# Patient Record
Sex: Male | Born: 1945 | Race: White | Hispanic: No | Marital: Single | Smoking: Former smoker
Health system: Southern US, Community
[De-identification: ages and names within clinical notes are randomized; demographics above are authoritative.]

## PROBLEM LIST (undated history)

## (undated) DIAGNOSIS — N189 Chronic kidney disease, unspecified: Secondary | ICD-10-CM

## (undated) DIAGNOSIS — Z87442 Personal history of urinary calculi: Secondary | ICD-10-CM

## (undated) DIAGNOSIS — I10 Essential (primary) hypertension: Secondary | ICD-10-CM

## (undated) DIAGNOSIS — I82409 Acute embolism and thrombosis of unspecified deep veins of unspecified lower extremity: Secondary | ICD-10-CM

## (undated) DIAGNOSIS — J45909 Unspecified asthma, uncomplicated: Secondary | ICD-10-CM

## (undated) DIAGNOSIS — R319 Hematuria, unspecified: Secondary | ICD-10-CM

## (undated) DIAGNOSIS — C801 Malignant (primary) neoplasm, unspecified: Secondary | ICD-10-CM

## (undated) DIAGNOSIS — F419 Anxiety disorder, unspecified: Secondary | ICD-10-CM

## (undated) DIAGNOSIS — N200 Calculus of kidney: Secondary | ICD-10-CM

## (undated) DIAGNOSIS — I2699 Other pulmonary embolism without acute cor pulmonale: Secondary | ICD-10-CM

## (undated) DIAGNOSIS — C672 Malignant neoplasm of lateral wall of bladder: Secondary | ICD-10-CM

## (undated) DIAGNOSIS — E785 Hyperlipidemia, unspecified: Secondary | ICD-10-CM

## (undated) DIAGNOSIS — N400 Enlarged prostate without lower urinary tract symptoms: Secondary | ICD-10-CM

## (undated) DIAGNOSIS — K219 Gastro-esophageal reflux disease without esophagitis: Secondary | ICD-10-CM

## (undated) DIAGNOSIS — E119 Type 2 diabetes mellitus without complications: Secondary | ICD-10-CM

## (undated) HISTORY — DX: Malignant neoplasm of lateral wall of bladder: C67.2

## (undated) HISTORY — DX: Hyperlipidemia, unspecified: E78.5

## (undated) HISTORY — PX: PERCUTANEOUS NEPHROLITHOTRIPSY: SHX2206

## (undated) HISTORY — PX: CYSTOSCOPY: SUR368

## (undated) HISTORY — PX: OTHER SURGICAL HISTORY: SHX169

## (undated) HISTORY — PX: TRANSURETHRAL RESECTION OF BLADDER TUMOR WITH GYRUS (TURBT-GYRUS): SHX6458

## (undated) HISTORY — DX: Hematuria, unspecified: R31.9

## (undated) HISTORY — PX: APPENDECTOMY: SHX54

## (undated) HISTORY — DX: Calculus of kidney: N20.0

---

## 2014-03-05 LAB — COMPREHENSIVE METABOLIC PANEL
Albumin: 4.2 g/dL (ref 3.4–5.0)
Alkaline Phosphatase: 60 U/L
Anion Gap: 8 (ref 7–16)
BUN: 24 mg/dL — ABNORMAL HIGH (ref 7–18)
Bilirubin,Total: 0.7 mg/dL (ref 0.2–1.0)
Calcium, Total: 9.4 mg/dL (ref 8.5–10.1)
Chloride: 109 mmol/L — ABNORMAL HIGH (ref 98–107)
Co2: 25 mmol/L (ref 21–32)
Creatinine: 1.81 mg/dL — ABNORMAL HIGH (ref 0.60–1.30)
EGFR (African American): 44 — ABNORMAL LOW
EGFR (Non-African Amer.): 38 — ABNORMAL LOW
Glucose: 194 mg/dL — ABNORMAL HIGH (ref 65–99)
Osmolality: 292 (ref 275–301)
Potassium: 4.5 mmol/L (ref 3.5–5.1)
SGOT(AST): 33 U/L (ref 15–37)
SGPT (ALT): 28 U/L
Sodium: 142 mmol/L (ref 136–145)
Total Protein: 7.9 g/dL (ref 6.4–8.2)

## 2014-03-05 LAB — CBC WITH DIFFERENTIAL/PLATELET
Basophil #: 0.1 10*3/uL (ref 0.0–0.1)
Basophil %: 0.4 %
Eosinophil #: 0.1 10*3/uL (ref 0.0–0.7)
Eosinophil %: 0.5 %
HCT: 41.9 % (ref 40.0–52.0)
HGB: 14.3 g/dL (ref 13.0–18.0)
Lymphocyte #: 1.3 10*3/uL (ref 1.0–3.6)
Lymphocyte %: 7.6 %
MCH: 31.6 pg (ref 26.0–34.0)
MCHC: 34 g/dL (ref 32.0–36.0)
MCV: 93 fL (ref 80–100)
Monocyte #: 0.9 x10 3/mm (ref 0.2–1.0)
Monocyte %: 5.1 %
Neutrophil #: 15.2 10*3/uL — ABNORMAL HIGH (ref 1.4–6.5)
Neutrophil %: 86.4 %
Platelet: 204 10*3/uL (ref 150–440)
RBC: 4.52 10*6/uL (ref 4.40–5.90)
RDW: 13.1 % (ref 11.5–14.5)
WBC: 17.6 10*3/uL — ABNORMAL HIGH (ref 3.8–10.6)

## 2014-03-05 LAB — URINALYSIS, COMPLETE: Specific Gravity: 1.027 (ref 1.003–1.030)

## 2014-03-05 LAB — PROTIME-INR
INR: 1.4
Prothrombin Time: 16.7 secs — ABNORMAL HIGH (ref 11.5–14.7)

## 2014-03-06 ENCOUNTER — Inpatient Hospital Stay: Payer: Self-pay | Admitting: Internal Medicine

## 2014-03-06 LAB — CBC WITH DIFFERENTIAL/PLATELET
Basophil #: 0.1 10*3/uL (ref 0.0–0.1)
Basophil #: 0.1 10*3/uL (ref 0.0–0.1)
Basophil %: 0.6 %
Basophil %: 0.3 %
Eosinophil #: 0 10*3/uL (ref 0.0–0.7)
Eosinophil #: 0 10*3/uL (ref 0.0–0.7)
Eosinophil %: 0 %
Eosinophil %: 0.3 %
HCT: 40.4 % (ref 40.0–52.0)
HCT: 37.2 % — ABNORMAL LOW (ref 40.0–52.0)
HGB: 13.7 g/dL (ref 13.0–18.0)
HGB: 12.2 g/dL — ABNORMAL LOW (ref 13.0–18.0)
Lymphocyte #: 1.5 10*3/uL (ref 1.0–3.6)
Lymphocyte #: 2.1 10*3/uL (ref 1.0–3.6)
Lymphocyte %: 8 %
Lymphocyte %: 12.6 %
MCH: 31.8 pg (ref 26.0–34.0)
MCH: 31.1 pg (ref 26.0–34.0)
MCHC: 34 g/dL (ref 32.0–36.0)
MCHC: 32.9 g/dL (ref 32.0–36.0)
MCV: 93 fL (ref 80–100)
MCV: 95 fL (ref 80–100)
Monocyte #: 0.9 x10 3/mm (ref 0.2–1.0)
Monocyte #: 1.2 x10 3/mm — ABNORMAL HIGH (ref 0.2–1.0)
Monocyte %: 4.6 %
Monocyte %: 7.1 %
Neutrophil #: 16.7 10*3/uL — ABNORMAL HIGH (ref 1.4–6.5)
Neutrophil #: 13.4 10*3/uL — ABNORMAL HIGH (ref 1.4–6.5)
Neutrophil %: 86.8 %
Neutrophil %: 79.7 %
Platelet: 178 10*3/uL (ref 150–440)
Platelet: 149 10*3/uL — ABNORMAL LOW (ref 150–440)
RBC: 4.33 10*6/uL — ABNORMAL LOW (ref 4.40–5.90)
RBC: 3.93 10*6/uL — ABNORMAL LOW (ref 4.40–5.90)
RDW: 13.4 % (ref 11.5–14.5)
RDW: 13.4 % (ref 11.5–14.5)
WBC: 19.3 10*3/uL — ABNORMAL HIGH (ref 3.8–10.6)
WBC: 16.8 10*3/uL — ABNORMAL HIGH (ref 3.8–10.6)

## 2014-03-06 LAB — HEMOGLOBIN: HGB: 12.6 g/dL — ABNORMAL LOW (ref 13.0–18.0)

## 2014-03-07 LAB — URINALYSIS, COMPLETE
Bacteria: NONE SEEN
Hyaline Cast: 7
RBC,UR: 3283 /HPF (ref 0–5)
Specific Gravity: 1.015 (ref 1.003–1.030)
Squamous Epithelial: 1
WBC UR: 86 /HPF (ref 0–5)

## 2014-03-07 LAB — BASIC METABOLIC PANEL
Anion Gap: 6 — ABNORMAL LOW (ref 7–16)
BUN: 19 mg/dL — ABNORMAL HIGH (ref 7–18)
Calcium, Total: 8 mg/dL — ABNORMAL LOW (ref 8.5–10.1)
Chloride: 109 mmol/L — ABNORMAL HIGH (ref 98–107)
Co2: 25 mmol/L (ref 21–32)
Creatinine: 1.45 mg/dL — ABNORMAL HIGH (ref 0.60–1.30)
EGFR (African American): 57 — ABNORMAL LOW
EGFR (Non-African Amer.): 49 — ABNORMAL LOW
Glucose: 193 mg/dL — ABNORMAL HIGH (ref 65–99)
Osmolality: 287 (ref 275–301)
Potassium: 3.7 mmol/L (ref 3.5–5.1)
Sodium: 140 mmol/L (ref 136–145)

## 2014-03-08 LAB — URINE CULTURE

## 2014-03-08 LAB — BASIC METABOLIC PANEL
Anion Gap: 9 (ref 7–16)
BUN: 21 mg/dL — ABNORMAL HIGH (ref 7–18)
Calcium, Total: 8.2 mg/dL — ABNORMAL LOW (ref 8.5–10.1)
Chloride: 110 mmol/L — ABNORMAL HIGH (ref 98–107)
Co2: 24 mmol/L (ref 21–32)
Creatinine: 1.28 mg/dL (ref 0.60–1.30)
EGFR (African American): >60
EGFR (Non-African Amer.): 57 — ABNORMAL LOW
Glucose: 116 mg/dL — ABNORMAL HIGH (ref 65–99)
Osmolality: 289 (ref 275–301)
Potassium: 3.7 mmol/L (ref 3.5–5.1)
Sodium: 143 mmol/L (ref 136–145)

## 2014-03-12 LAB — PATHOLOGY REPORT

## 2014-04-02 ENCOUNTER — Ambulatory Visit: Payer: Self-pay | Admitting: Urology

## 2014-04-02 LAB — BASIC METABOLIC PANEL
Anion Gap: 4 — ABNORMAL LOW (ref 7–16)
BUN: 19 mg/dL — ABNORMAL HIGH (ref 7–18)
Calcium, Total: 9.1 mg/dL (ref 8.5–10.1)
Chloride: 107 mmol/L (ref 98–107)
Co2: 31 mmol/L (ref 21–32)
Creatinine: 1.32 mg/dL — ABNORMAL HIGH (ref 0.60–1.30)
EGFR (African American): >60
EGFR (Non-African Amer.): 57 — ABNORMAL LOW
Glucose: 145 mg/dL — ABNORMAL HIGH (ref 65–99)
Osmolality: 288 (ref 275–301)
Potassium: 3.9 mmol/L (ref 3.5–5.1)
Sodium: 142 mmol/L (ref 136–145)

## 2014-04-03 LAB — URINE CULTURE

## 2014-04-10 ENCOUNTER — Ambulatory Visit: Payer: Self-pay | Admitting: Urology

## 2014-04-12 LAB — PATHOLOGY REPORT

## 2014-04-17 ENCOUNTER — Ambulatory Visit: Payer: Self-pay | Admitting: Urology

## 2014-04-18 LAB — BASIC METABOLIC PANEL
Anion Gap: 5 — ABNORMAL LOW (ref 7–16)
BUN: 19 mg/dL — ABNORMAL HIGH (ref 7–18)
Calcium, Total: 8.4 mg/dL — ABNORMAL LOW (ref 8.5–10.1)
Chloride: 107 mmol/L (ref 98–107)
Co2: 29 mmol/L (ref 21–32)
Creatinine: 1.53 mg/dL — ABNORMAL HIGH (ref 0.60–1.30)
EGFR (African American): 58 — ABNORMAL LOW
EGFR (Non-African Amer.): 48 — ABNORMAL LOW
Glucose: 97 mg/dL (ref 65–99)
Osmolality: 283 (ref 275–301)
Potassium: 4.3 mmol/L (ref 3.5–5.1)
Sodium: 141 mmol/L (ref 136–145)

## 2014-04-18 LAB — CBC WITH DIFFERENTIAL/PLATELET
Basophil #: 0 10*3/uL (ref 0.0–0.1)
Basophil %: 0.6 %
Eosinophil #: 0.3 10*3/uL (ref 0.0–0.7)
Eosinophil %: 4.3 %
HCT: 37.4 % — ABNORMAL LOW (ref 40.0–52.0)
HGB: 12.6 g/dL — ABNORMAL LOW (ref 13.0–18.0)
Lymphocyte #: 1.5 10*3/uL (ref 1.0–3.6)
Lymphocyte %: 19.3 %
MCH: 31.6 pg (ref 26.0–34.0)
MCHC: 33.7 g/dL (ref 32.0–36.0)
MCV: 94 fL (ref 80–100)
Monocyte #: 0.7 x10 3/mm (ref 0.2–1.0)
Monocyte %: 8.8 %
Neutrophil #: 5.2 10*3/uL (ref 1.4–6.5)
Neutrophil %: 67 %
Platelet: 144 10*3/uL — ABNORMAL LOW (ref 150–440)
RBC: 3.99 10*6/uL — ABNORMAL LOW (ref 4.40–5.90)
RDW: 12.8 % (ref 11.5–14.5)
WBC: 7.7 10*3/uL (ref 3.8–10.6)

## 2014-04-19 LAB — PATHOLOGY REPORT

## 2014-05-21 ENCOUNTER — Ambulatory Visit: Payer: Self-pay | Admitting: Urology

## 2014-06-06 ENCOUNTER — Ambulatory Visit: Payer: Self-pay | Admitting: Urology

## 2014-07-23 ENCOUNTER — Ambulatory Visit: Payer: Self-pay | Admitting: Urology

## 2014-08-01 ENCOUNTER — Ambulatory Visit: Payer: Self-pay | Admitting: Urology

## 2014-08-01 LAB — URINALYSIS, COMPLETE
Bacteria: NONE SEEN
Bilirubin,UR: NEGATIVE
Blood: NEGATIVE
Glucose,UR: NEGATIVE mg/dL (ref 0–75)
Ketone: NEGATIVE
Leukocyte Esterase: NEGATIVE
Nitrite: NEGATIVE
Ph: 7 (ref 4.5–8.0)
Protein: NEGATIVE
RBC,UR: 1 /HPF (ref 0–5)
Specific Gravity: 1.012 (ref 1.003–1.030)
Squamous Epithelial: NONE SEEN
WBC UR: 1 /HPF (ref 0–5)

## 2014-08-01 LAB — CBC
HCT: 41 % (ref 40.0–52.0)
HGB: 13.6 g/dL (ref 13.0–18.0)
MCH: 29.8 pg (ref 26.0–34.0)
MCHC: 33.2 g/dL (ref 32.0–36.0)
MCV: 90 fL (ref 80–100)
Platelet: 209 10*3/uL (ref 150–440)
RBC: 4.56 10*6/uL (ref 4.40–5.90)
RDW: 13.2 % (ref 11.5–14.5)
WBC: 6 10*3/uL (ref 3.8–10.6)

## 2014-08-01 LAB — BASIC METABOLIC PANEL
Anion Gap: 8 (ref 7–16)
BUN: 25 mg/dL — ABNORMAL HIGH (ref 7–18)
Calcium, Total: 9.1 mg/dL (ref 8.5–10.1)
Chloride: 105 mmol/L (ref 98–107)
Co2: 28 mmol/L (ref 21–32)
Creatinine: 1.41 mg/dL — ABNORMAL HIGH (ref 0.60–1.30)
EGFR (African American): >60
EGFR (Non-African Amer.): 53 — ABNORMAL LOW
Glucose: 171 mg/dL — ABNORMAL HIGH (ref 65–99)
Osmolality: 290 (ref 275–301)
Potassium: 4.1 mmol/L (ref 3.5–5.1)
Sodium: 141 mmol/L (ref 136–145)

## 2014-08-02 LAB — URINE CULTURE

## 2014-08-07 ENCOUNTER — Ambulatory Visit: Payer: Self-pay | Admitting: Urology

## 2014-10-26 NOTE — Op Note (Signed)
PATIENT NAME:  Cory Hardin, Cory Hardin MR#:  [id number] DATE OF BIRTH:  17-Sep-1945  DATE OF PROCEDURE:  04/17/2014  PROCEDURE PERFORMED: Left ureteroscopy, laser lithotripsy, and left ureteral stent exchange, transurethral resection of the prostate.   PREOPERATIVE DIAGNOSES:  Left ureteral stone, benign prostatic hypertrophy with outlet obstruction.   POSTOPERATIVE DIAGNOSES:  Left ureteral stone, benign prostatic hypertrophy with outlet obstruction.   ANESTHESIA: General.   ATTENDING SURGEON:   [name], M.D.  ESTIMATED BLOOD LOSS:  50 mL.  SPECIMEN:   Prostate chips.   COMPLICATIONS: None.   DRAINS: A 6 x 26 French double-J ureteral stent on the left, #20 [place] 3-way Foley catheter with 30 mL balloon with CBI.  INDICATION:  This is a 69 year old male with multiple GU problems including a history of bladder cancer status post TURBT, bladder stones previously removed, bladder outlet obstruction, left distal ureteral stone, right nonobstructing stones, who most recently underwent left ureteroscopy for a 1.2 cm left distal ureteral stone. At that procedure his stent was noted to be incredibly encrusted with a significant stone burden and the decision was made to perform a staged procedure in order to clear the remaining stone fragment from his ureter for which he returns today. He also has a history of benign prostatic hypertrophy obstructive voiding symptoms, bladder stones consistent with bladder outlet obstruction and has been counseled to undergo bipolar TURP as well today. Risks and benefits of the procedure were explained in detail. The patient agreed to proceed as planned.   PROCEDURE: The patient was correctly identified in the preoperative holding area and informed consent was obtained. He was brought to the operating suite and placed on the table in supine position. At this time, universal timeout protocol was performed. All team members were identified. Venodyne boots were placed and he  was administered IV gentamicin and Levaquin in the perioperative period.   He was then placed under general anesthesia, repositioned on the bed in the dorsal lithotomy position and prepped and draped in a standard surgical fashion. A #22 French rigid cystoscope was then advanced per urethra into the bladder, and attention was turned to the left ureteral orifice from which a stent was seen emanating. The distal end of the coil was grasped using stent graspers, which was brought out to the level of the urethral meatus. This was then cannulated using a Sensor wire up to the level of the renal pelvis and the stent was removed. Of note, this was again somewhat encrusted despite having only been placed 1 week ago. The wire was then snapped in place and a  long semirigid ureteroscope was then advanced into the distal ureter and residual stone fragment was encountered. A 365 micron laser fiber was then used using the settings of 0.8 joules and 10 Hz to fragment the remaining stone. Once this was accomplished,  a tipless Nitinol basket was then used to extract each and every stone fragment until the entire distal ureter was clear. The scope was able to be advanced into the proximal ureter, which was noted to be free of stone fragments. Once the ureter was cleared, a second Sensor wire was placed up to the level of the renal pelvis and the scope was removed. A #10 French flexible Olympus ureteroscope was then advanced over this wire to the level of the kidney and the wire was removed. A formal pyeloscopy was then performed. There was only 1 small stone fragment encountered in the mid posterior pole approximately 4 mm in size.  The laser fiber was then used to dust a small portion of this stone and eventually this stone was small enough to be basketed out of the kidney in 1 piece. Prior to removing the scope and the stone, all remaining calyces were directly visualized and there was no additional residual stone fragment  noted.   A retrograde pyelogram was performed at the level of the UPJ to ensure that all calyces had been directly visualized creating a map of the kidney. Once this was accomplished and all stone fragment was cleared out of the left collecting system and ureter a 6 x 26 French double-J ureteral stent was then placed, which was accomplished by backloading the safety wire over a rigid scope and the stent was placed up to the level of the renal pelvis. The wire was then partially withdrawn. A coil was noted within the renal pelvis. The wire was then fully withdrawn and the coil was noted within the bladder. The bladder was then drained of all stone fragment which had then basketed out of the ureter and kidney.   This rigid scope was then exchanged for a bipolar #26 [place] resectoscope and using the loop, the median lobe of the prostate was taken down. The lateral lobes were then taken down with care taken to not resect past the level of the veru. The prostate was relatively short in nature, but did have an elevated bladder neck, which was able to be taken down successfully. Hemostasis was then achieved using the bipolar loop. All remaining prostate chips were evacuated from the bladder using a [place] syringe. The irrigation was turned off at this point and there was no active bleeding noted from the prostate or bladder neck. The scope was then removed after all prostate chips were removed. A #20 [place] 3-way Foley catheter was advanced per urethra into the bladder and the balloon was inflated with 30 mL of sterile water. CBI was then initiated with a slow drip at which time the urine was noted to be light pink.   The patient was then reversed from anesthesia and taken to the PACU in stable condition. There were no complications in this case.    ____________________________ Sherlynn Stalls, MD ajb:jw D: 04/17/2014 17:35:06 ET T: 04/17/2014 19:31:09 ET JOB#: [id number]  cc: Sherlynn Stalls, MD,  <Dictator> Sherlynn Stalls MD ELECTRONICALLY SIGNED 04/18/2014 9:29

## 2014-10-26 NOTE — Op Note (Signed)
PATIENT NAME:  Cory Hardin, Cory Hardin MR#:  [id number] DATE OF BIRTH:  03/29/1946  DATE OF PROCEDURE:  04/10/2014  ATTENDING PHYSICIAN: Sherlynn Stalls, MD.   PREOPERATIVE DIAGNOSES:  1.  Left distal ureteral stone.  2.  History of bladder cancer.  3.  BPH.  4.  History of bladder stones.  5.  Bilateral nephrolithiasis.   POSTOPERATIVE DIAGNOSES:  1.  Left distal ureteral stone.  2.  History of bladder cancer.  3.  BPH.  4.  History of bladder stones.  5.  Bilateral nephrolithiasis.  6.  Encrusted ureteral stent.   PROCEDURES PERFORMED: Left ureteroscopy, laser lithotripsy, lithotripsy of encrusted ureteral stone, ureteral stent exchange, bladder biopsy.   INTERPRETATION OF FLUOROSCOPY: Less than 30 minutes.   ANESTHESIA: General anesthesia.   ESTIMATED BLOOD LOSS: Minimal.   DRAINS: A 6 x 26 French double-J ureteral stent.   SPECIMENS: Bladder biopsy. Additional specimen, stone fragment.  COMPLICATIONS: None.   INDICATIONS: This is a 69 year old male who previously presented to the ER in clot retention. He was taken to the OR for clot evacuation and found to have a bladder tumor which was resected. He was also noted to have a 1.2 cm left distal ureteral stone with leukocytosis. Therefore, he underwent left ureteral stent placement. He returns today for definitive management of this left distal ureteral stone and hospital possible outlet procedure if necessary in order to facilitate this, as well as cystoscopy and repeat biopsy if needed. Risks and benefits of the procedure were explained in detail. The patient agreed to proceed as planned.   DESCRIPTION OF PROCEDURE: The patient was correctly identified in the preoperative holding area and informed consent was obtained. He was brought to the operating suite and placed on the table in the supine position. At this time, universal timeout protocol was performed. All team members were identified. Venodyne boots were placed and he was  administered 500 IV Levaquin. He was then placed under general anesthesia, intubated, and repositioned lower on the bed in the dorsal lithotomy position. He was then prepped and draped in standard surgical fashion. At this point in time, a 91 French rigid cystoscope was passed per urethra into the bladder. Of note, a very mild bulbar urethral stricture was noted which was easily passed with the scope without any further intervention. At this point in time, the bladder was surveilled using both the 70 degree and 30 degree lenses. This revealed 3 areas of previous resection, one just beyond the right trigone of the bladder, one just beyond this, and a very small area on the right lateral wall of the bladder. One of the previous resection sites had an area of focal necrosis overlying it. This was biopsied using cold cup biopsy forceps to ensure no residual tumor, although it appeared relatively benign in the healing process. Later in the case, this was fulgurated using the loop of the resectoscope for hemostasis. Attention was then turned to the left ureteral orifice from which the ureteral stent was seen emanating. The angle needed to access the ureteral orifice was relatively favorable without performing a TURP. Therefore, the decision was made to defer this procedure at this time. The distal coil of the stent was grasped using stent graspers and attempted to be removed; however, resistance was met. Of note, the stent did appear quite encrusted-appearing. At this point in time, a Sensor wire was able to be passed alongside of the stent up to the level of the kidney, confirmed under fluoroscopy, and  was snapped in place with a safety wire. A long rigid ureteroscope was then introduced into the UL just alongside of the stent. At this point, the stent was noted to be profoundly encrusted, completely encased with stone material. I was able to advance this rigid ureteroscope up to the level of the 1.2 cm distal stone. At  this point in time, a 365 micron laser fiber was used using the settings of 0.8 joules and 10 Hz and ultimately 15 Hz to fragment this stone until it was completely fragmented and able to be passed beyond this area with a rigid scope. I also lasered along the stent itself using a much lower laser settings in order to avoid damage to the stent itself. The rigid scope could not be passed beyond the iliac crest, therefore no additional lasering could be completed of the stent using this modality. I then exchanged the rigid ureteroscope again for the cystoscope, and again attempted to remove the stent with minimal success. I then attempted several maneuvers, including passing a Super Stiff wire alongside of the stent in order to break up some of the encrustation at the proximal coil, which was relatively unsuccessful. I then passed an open ended ureteral catheter alongside of encrusted stent, again with the same intent but minimal success. Next, I passed a second sensor wire alongside of the safety wire up to the level of the kidney, confirmed under fluoroscopic guidance. I was then able to pass a 10 [place] flexible ureteroscope up to the UPJ beyond the level of the stent. The remainder of the stent was then carefully de-encrusted using laser lithotripsy. Once this was deemed adequate down to the level that had been previously de-encrusted, the flexible ureteroscope was removed. The distal coil of the stent was then grasped using stent graspers and was able to be brought up to the level of the meatus. The entire stent still could not be removed in entirety at this time due to resistance, presumably to stent encrustation. I then attempted to cannulate the encrusted stent; however, this was impossible due to stone material within the stent itself. Ultimately, the remainder of the stent was lasered using again the rigid ureteroscope and ultimately the stent was able to be removed. The distal ureter was then piecewise  cleared of stone fragment using 3-French basket. Some of these larger fragments were kept as stone specimen. At this point in time, visualization became somewhat poor and the decision was made given the amount of stone fragments within the distal ureter and concern for [place] to plan for a staged procedure and return at a later date in the near future to clear the remainder of the distal ureter, as well as treat the nonobstructing stones in the left kidney. We can also consider performing an outlet procedure TURP at that same time as well. The safety wire was then back-loaded over a rigid cystoscope and a 6 x 26 French double-J ureteral stent was advanced over this wire to the level of the renal pelvis. The wire was partially withdrawn until a coil was noted within the renal pelvis. The wire was then fully withdrawn and a coil was noted within the bladder. Hemostasis was then carefully achieved using the loop of the Olympus bipolar resectoscope at the previous biopsy sites, as well as some small bleeders of the bladder neck. The bladder was then emptied of all remaining stone fragment and clot, and the scope was ultimately removed. The patient was then repositioned in the supine position,  reversed from anesthesia and taken to the PACU in stable condition.   PLAN: The patient will return for staged left ureteroscopy to clear the remainder of the distal stone, as well as treat the upper tract nonobstructing stones, as well as for consideration of an outlet procedure.     ____________________________ Sherlynn Stalls, MD ajb:at D: 04/10/2014 10:47:18 ET T: 04/10/2014 12:23:50 ET JOB#: [id number]  cc: Sherlynn Stalls, MD, <Dictator> Sherlynn Stalls MD ELECTRONICALLY SIGNED 04/14/2014 9:01

## 2014-10-26 NOTE — Discharge Summary (Signed)
Dates of Admission and Diagnosis:  Date of Admission 17-Apr-2014   Date of Discharge 18-Apr-2014   Admitting Diagnosis BPH, left ureteral stone   Final Diagnosis BPH, left ureteral stone    Chief Complaint/History of Present Illness See H&P   Allergies:  Flomax: N/V/Diarrhea  Routine Chem:  15-Oct-15 04:17   Glucose, Serum 97  BUN  19  Creatinine (comp)  1.53  Sodium, Serum 141  Potassium, Serum 4.3  Chloride, Serum 107  CO2, Serum 29  Calcium (Total), Serum  8.4  Anion Gap  5  Osmolality (calc) 283  eGFR (African American)  58  eGFR (Non-African American)  48 (eGFR values <47m/min/1.73 m2 may be an indication of chronic kidney disease (CKD). Calculated eGFR, using the MRDR Study equation, is useful in  patients with stable renal function. The eGFR calculation will not be reliable in acutely ill patients when serum creatinine is changing rapidly. It is not useful in patients on dialysis. The eGFR calculation may not be applicable to patients at the low and high extremes of body sizes, pregnant women, and vetetarians.)  Routine Hem:  15-Oct-15 04:17   WBC (CBC) 7.7  RBC (CBC)  3.99  Hemoglobin (CBC)  12.6  Hematocrit (CBC)  37.4  Platelet Count (CBC)  144  MCV 94  MCH 31.6  MCHC 33.7  RDW 12.8  Neutrophil % 67.0  Lymphocyte % 19.3  Monocyte % 8.8  Eosinophil % 4.3  Basophil % 0.6  Neutrophil # 5.2  Lymphocyte # 1.5  Monocyte # 0.7  Eosinophil # 0.3  Basophil # 0.0 (Result(s) reported on 18 Apr 2014 at 05:22AM.)   Pertinent Past History:  Pertinent Past History see H&P   Hospital Course:  Hospital Course Patient was admitted following surgical procedure (left ureteroscopy, laser lithotripsy, left ureteral stent exchange, TURP).  CBI was continued overnight and stopped in the AM.  His urine remained relatively clear. Labs were stable.  He underwent sucessful voiding trial and he was discharged home.   Condition on Discharge Good   Code Status:   Code Status Full Code   DISCHARGE INSTRUCTIONS HOME MEDS:  Medication Reconciliation: Patient's Home Medications at Discharge:     Medication Instructions  pravastatin 40 mg oral tablet  1 tab(s) orally once a day (at bedtime)   trilipix 135 mg oral delayed release capsule  1 cap(s) orally once a day   coreg cr  12.5 milligram(s) orally 2 times a day   docusate sodium 100 mg oral capsule  1 cap(s) orally 2 times a day   oxybutynin 5 mg oral tablet  1 tab(s) orally every 8 hours, As needed, bladder spasms   percocet 5/325  1  PO every 8 hours, As Needed - for Bladder Spasms    PRESCRIPTIONS: PRINTED AND PLACED ON CHART   Physician's Instructions:  Home Health? No   Home Oxygen? No   Diet Regular   Activity Limitations None   Return to Work Not Applicable   Time frame for Follow Up Appointment 1-2 weeks  Cysto/ stent removal next week   TIME SPENT:  Total Time: 30 minutes or less   Electronic Signatures: BSherlynn Stalls(MD)  (Signed 15-Oct-15 07:22)  Authored: ADMISSION DATE AND DIAGNOSIS, CHIEF COMPLAINT/HPI, Allergies, PERTINENT LABS, PERTINENT [place], PATIENT INSTRUCTIONS, TIME SPENT   Last Updated: 15-Oct-15 07:22 by BSherlynn Stalls(MD)

## 2014-10-26 NOTE — H&P (Signed)
PATIENT NAME:  Cory Hardin, Cory Hardin MR#:  [id number] DATE OF BIRTH:  1945/11/11  DATE OF ADMISSION:  03/06/2014  REFERRING PHYSICIAN: Dr. Archie Balboa     PRIMARY CARE PHYSICIAN: Leona Carry. Hall Busing, MD   CHIEF COMPLAINT: Inability to urinate.    HISTORY OF PRESENT ILLNESS: This is a 69 year old Caucasian gentleman with history of hypertension,  hyperlipidemia and nephrolithiasis, who presented with the inability to urinate. Describes a 4-day duration of intermittent hematuria, which originally occurred as bloody urine, which cleared to yellow urine; however, is now frank blood for 1 day's duration. He presented with the inability to urinate after passing some blood clots. He had associated suprapubic pain, described mainly as pressure; 8 or 9 out of 10 in intensity, no worsening or relieving factors, no radiation, no fevers or chills or further symptomatology.   REVIEW OF SYSTEMS:  CONSTITUTIONAL: No fever or chills or fatigue.  EYES: No blurred vision, double vision, eye pain.  EARS, NOSE, AND THROAT: No tinnitus, ear pain or hearing loss.  RESPIRATORY: No cough or shortness of breath.  CARDIOVASCULAR: No chest pain, palpitations or edema.  GASTROINTESTINAL: Positive for abdominal pain as described, with no nausea, vomiting or diarrhea.  GENITOURINARY: Positive for hematuria, as described above, as well as dysuria.  ENDOCRINE: No nocturia or thyroid problems.  HEMATOLOGIC AND LYMPHATIC: No bruising. Positive for bleeding as described above.  SKIN: No rashes or lesions.  MUSCULOSKELETAL: No pain in neck, back, shoulders, knees, hips or arthritic symptoms.  NEUROLOGIC: No problems with paresthesias or paralysis. PSYCHIATRIC: Denies anxiety or depression symptoms.  Otherwise, full review of systems was performed by me and was negative.   PAST MEDICAL HISTORY: Hyperlipidemia, hypertension, nephrolithiasis.   SOCIAL HISTORY: Remote tobacco use. Denies any alcohol or drug use.   FAMILY HISTORY: No known  cardiovascular or pulmonary disorders.   ALLERGIES: No known drug allergies.   HOME MEDICATIONS: Incomplete list, as he has does not recall his medications; however, he does take an aspirin 81 mg p.o. daily. He is also on "some blood pressure medication as well as some cholesterol medication".   PHYSICAL EXAMINATION:  VITAL SIGNS: Temperature 96.8, heart rate 69, respirations 20, blood pressure 174/88, saturating 95% on room air. Weight 82.6 kg. BMI 24.7.  GENERAL: Well-nourished, well-developed Caucasian gentleman with an acute distress. HEAD: Normocephalic, atraumatic.  EYES: Pupils equal, round and reactive light. Extraocular muscles intact. No scleral icterus. MOUTH:  Moist mucosal membranes. Dentition intact. No abscess noted.  EARS, NOSE, AND THROAT: Clear, without exudates. No external lesions.  NECK: Supple. No thyromegaly. No nodules. No JVD.  PULMONARY: Clear to auscultation, bilaterally, without wheeze, rales or rhonchi.  No use of accessory muscles. Good respiratory effort. CHEST: Nontender to palpation.  CARDIOVASCULAR: S1, S2. Regular rate and rhythm. No murmurs, rubs or gallops. No edema. Pedal pulses 2+ bilaterally.  GASTROINTESTINAL: Soft, nontender, nondistended. No masses palpable. Positive bowel sounds. No hepatosplenomegaly.  MUSCULOSKELETAL: No swelling, clubbing or edema. Range of motion full in all extremities.  NEUROLOGIC: Cranial nerves II through XII intact. No gross focal neurologic deficits.  Sensory intact. Reflexes intact.  SKIN: No ulcerations, lesions, rash or cyanosis. Skin warm and dry. Turgor intact. PSYCHIATRIC: Mood and affect are appropriate. The patient is awake, alert, and oriented x 3. Insight and judgement intact.  LABORATORY DATA: CT abdomen and pelvis performed revealing bilateral nonobstructive  renal stones and mild fullness of the left renal collecting system and ureter with a 1.1 cm distal left ureteral stone, large blood clot  within the bladder  and intraluminal calculus, adherent clot versus irregular thickening of the posterior left bladder wall; malignancy cannot be excluded.  LABORATORY DATA: Sodium 142, potassium 4.5, chloride 109, bicarb 25, BUN 24, creatinine 1.81, glucose 194. LFTs within normal limits. WBC 17.6, hemoglobin upon arrival 14.3, platelets of 204,000. INR of 1.4. Urinalysis grossly bloody; unable to interpret dipstick.   ASSESSMENT AND PLAN: The patient is a 69 year old gentleman with a history of hypertension, hyperlipidemia and nephrolithiasis presenting with the inability to urinate after having gross hematuria.  1.  Acute kidney injury, possible post renal symptoms given the above symptoms. Will provide intravenous fluid hydration. Has a Foley catheter in place currently. Follow urine output and renal function.  2.  Hematuria. Given multiple stones as well as intraluminal bladder stone we will consult urology. Trend CBCs q. 6 hours. Transfusion threshold for hemoglobin less than 7. Hold his aspirin. Will dose IV ceftriaxone given bladder irritation and leukocytosis. There is some concern, as he did mention having unintentional weight loss of a few pounds over the last few months. Once again, see urology's  recommendations.  3.  Hypertension. Unknown medication. Will add p.r.n. hydralazine for now.  4.  Hyperlipidemia on no medications. Restarted his statins, when known.  5.  Venous thrombosis prophylaxis with sequential compression devices.   The patient is Full Code.  TIME SPENT: 45 minutes    ____________________________ Aaron Mose. Breely Panik, MD dkh:MT D: 03/06/2014 03:01:46 ET T: 03/06/2014 05:41:58 ET JOB#: [id number]  cc: Aaron Mose. Aseret Hoffman, MD, <Dictator> Sairah Knobloch Woodfin Ganja MD ELECTRONICALLY SIGNED 03/13/2014 12:34

## 2014-10-26 NOTE — Op Note (Signed)
PATIENT NAME:  Cory Hardin, Cory Hardin MR#:  [id number] DATE OF BIRTH:  05-02-1946  DATE OF PROCEDURE:  03/07/2014  PREOPERATIVE DIAGNOSES:  Gross hematuria, left ureteral stone.   POSTOPERATIVE DIAGNOSES:  Gross hematuria, left ureteral stone, bladder tumor.  PROCEDURE PERFORMED:   1.  Cystoscopy.  2.  Evacuation of bladder stones.  3.  Left ureteral stent placement.  4.  Fluoroscopy with interpretation, less than 30 minutes.  5.  Transurethral resection of bladder tumor (2-5 cm).  ATTENDING SURGEON:  Sherlynn Stalls, MD.   ANESTHESIA: General.   ESTIMATED BLOOD LOSS:  25 mL.   SPECIMENS:  Bladder tumor.   DRAINS:  A 6 x 26 French double-J ureteral stent, a 20 French 2-way Foley catheter.  COMPLICATIONS:  None.   INDICATION:  This is a 69 year old male who was admitted overnight for gross hematuria, found to have a 1.1 mm left distal ureteral calculus and fullness of the collecting system. He was also found to have leukocytosis but no other evidence of sepsis.  Given the degree of his hematuria as well as rising white count in the setting of ureteral stone he was counseled to undergo placement of a ureteral stent as well as clot evacuation and fulguration of any bleeding vessels.  Risks and benefits of the procedure were explained in detail to the patient who agreed to proceed.   DESCRIPTION OF PROCEDURE: The patient was properly identified in the preoperative holding area and informed consent was confirmed.  He was brought to the operating suite and placed on the table in the supine position. At this time a time out protocol was performed. All team members were identified.  Veno-Dyne boots were placed and he was administered an additional 500 mL of Levaquin on top of the ceftriaxone which he had previously received on the floor.  He was then placed under general anesthesia and repositioned in the dorsal lithotomy position, prepped and draped in the standard surgical fashion.    At this point  in time a rigid cystoscope using a 72 French access sheath was advanced per urethra into the bladder at which time the bladder was surveilled.  There were several small clots, as well as small bladder calculi noted throughout the bladder. Additionally, there were 3 bladder tumors which were identified, one which was oozing and likely the source of his gross hematuria. These were located on the right trigone area just beyond the ureteral orifice, as well as 2 additional satellite lesions in the right lateral wall of the bladder.  The total size of each of these tumors was approximately 3 cm.  They were papillary in nature with some mild necrosis.   Attention was then turned to the left ureteral orifice which was clearly identified without evidence of clear efflux of urine from this side. It was then cannulated with a 5 [place] open-ended ureteral catheter just with ureteral orifice and a Sensor wire was placed up to the level of the kidney under fluoroscopic guidance without difficulty, easily bypassing the distal ureteral stone which could also be seen on fluoroscopy.  A 6 x 26 French double-J ureteral stent was then advanced over the wire to the level of the renal pelvis and the wire was partially drawn into an adequate coil as noted with kidney. The wire was then fully withdrawn and adequate coil was noted within the bladder. No string was left on the stent.   At this point in time the rigid cystoscope was exchanged for a 26 [place]  resectoscope.  Using the loupe the tumors were then completely resected down to the layer of muscle in each. The base of each of these tumors was fulgurated to ensure adequate hemostasis. The bilateral ureteral orifices were again carefully inspected, a stent emerging from the left, and the right with clear efflux of urine after resection of the tumors. The remaining bladder was inspected.  There were no additional tumors noted. Some small areas within the prostate were then  fulgurated, which were bleeding until the entire bladder was noted to be hemostatic with outflow of water.   The resectoscope was then removed and a 20 [place] 2-way catheter was replaced and 10 mL of water was placed into the balloon. The catheter was anchored. At this point the urine was noted to be clear. The patient was then repositioned into the supine position, awakened from anesthesia and taken to the PACU in stable condition.    There were no complications in this case.  The family was counseled about the incidental finding of bladder tumors, which was discussed at length.    ____________________________ Sherlynn Stalls, MD ajb:lt D: 03/07/2014 11:27:54 ET T: 03/07/2014 12:05:13 ET JOB#: [id number]  cc: Sherlynn Stalls, MD, <Dictator> Sherlynn Stalls MD ELECTRONICALLY SIGNED 04/14/2014 8:25

## 2014-10-26 NOTE — Discharge Summary (Signed)
PATIENT NAME:  Cory Hardin, Cory Hardin MR#:  [id number] DATE OF BIRTH:  [birth year]  DATE OF ADMISSION:  03/06/2014 DATE OF DISCHARGE:  03/08/2014  PRIMARY CARE PHYSICIAN:  Benita Stabile, MD    FINAL DIAGNOSES:  1.  Acute renal failure.  2.  Hematuria, bladder tumors and nephrolithiasis.  3.  Hypertension.  4.  Hyperlipidemia.   MEDICATIONS ON DISCHARGE: Include Coreg CR 12.5 mg daily, pravastatin 40 mg at bedtime, multivitamin 1 tablet daily, Trilipix 135 mg daily, Flomax 0.4 mg at bedtime, oxybutynin 5 mg 3 times a day as needed for bladder spasms, cephalexin 500 mg 3 times a day for 7 days. Stop taking aspirin at this point.   DIET:  Low sodium diet, regular consistency.   ACTIVITY: As tolerated.   FOLLOW UP:  With urology, Dr. [name] in 1 week, 1 to 2 weeks with Dr. Benita Stabile.   HOSPITAL COURSE: The patient was admitted 03/06/2014 and discharged 03/08/2014, was admitted with the inability to urinate, hematuria, pain was admitted with acute renal failure.  Aspirin was held, empiric ceftriaxone was given.   Urology consultation was obtained.    LABORATORY AND RADIOLOGICAL DATA: First urinalysis read, cloudy, too many to count red blood cells.  INR was 1.4.  White blood cell count 17.6, hemoglobin and hematocrit 14.3 and 41.9, platelet count of 204. Glucose 194, BUN 24, creatinine 1.81, sodium 142, potassium 4.5, chloride 109, CO2 of 25.  Liver function tests normal range. CT scan of the abdomen and pelvis with and without contrast showed bilateral nonobstructing renal stones, mild fullness left renal collecting system and ureter, 1.1 cm distal left ureteral stone, large blood clot within the bladder, fat stranding, ill-defined fluid in the bladder, correlate with urinalysis.  EKG normal sinus rhythm, no acute ST-T wave changes. Creatinine on September 3 of 1.45. Creatinine on September 4 of 1.28.  Urine culture no growth.   PROCEDURES:  On September 2, Dr. Erlene Quan did a cystoscopy left ureteral  stent placement and evacuation of clot and bladder stones.  HOSPITAL COURSE:    1.  For the patient's acute renal failure: This had improved with a stent placement and removal of clots and IV fluid hydration.  2.  For the patient's hematuria, bladder tumors and nephrolithiasis: The patient had a clot removal, bladder tumor removal and stent placements.  Follow up with urology as outpatient for pathology and stent removal in the future.  Hold on aspirin at this point in time just in case kidney stone needs nephrolithiasis.  3.  Hypertension. Blood pressure stable on Coreg.  4.  Hyperlipidemia, on pravastatin and Trilipix.  5.  Follow up here is also important because of the biopsy on the bladder tumors and further recommendations based on the pathology.    TIME SPENT ON DISCHARGE: 35 minutes.      ____________________________ Tana Conch. Leslye Peer, MD rjw:DT D: 03/08/2014 14:45:42 ET T: 03/08/2014 16:20:20 ET JOB#: [id number]  cc: Tana Conch. Leslye Peer, MD, <Dictator> Leona Carry. Hall Busing, MD Sherlynn Stalls, MD Marisue Brooklyn MD ELECTRONICALLY SIGNED 03/19/2014 12:25

## 2014-10-26 NOTE — Consult Note (Signed)
Chief Complaint:  Subjective/Chief Complaint Urine very light pink this AM, no clots or obstruction of tubeovernight.  Discussed at length unexpected finding of bladder cancer discovered in OR, likely source of bleeding.  Otherwise feeling well this AM.   Brief Assessment:  GEN well developed, well nourished, no acute distress   Respiratory normal resp effort   Gastrointestinal Normal  No suprapubic tenderness or distention   Gastrointestinal details normal Soft  Nontender  Nondistended   EXTR negative cyanosis/clubbing   Additional Physical Exam Foley in place, draining light pink urine.  No clots, minimal debris.   Assessment/Plan:  Assessment/Plan:  Assessment 69 yo M with multiple GU issues now POD 1 s/p cystoscopy, TURBT, evacuation of bladder stones, left ureteral stent placement.  Bleeding resolved, likely source bladder tumor.   Plan 1. Hematuria- resolved, may remove Foley this AM 2. New dx bladder cancer- discussed findings with patient and sister at length this AM, will require close follow up and pathology review next week 3. Left ureteral stone- stent now in place, outpt follow up for surgical planning 4. Renal calculi- as above 5. Possible UTI- ? f/u urine culture, treat with empiric abx upon discharge 6. AKI- please recheck labs this am to trend creatinine  Patient OK for discharge today if voids with low PVR x 2.  Please discharge with ditropan 5 mg PO tid prn bladder spasms and FLomax 0.4 mg.    Will arrange for f/u in the office next week   Electronic Signatures: Sherlynn Stalls (MD)  (Signed 03-Sep-15 08:48)  Authored: Chief Complaint, Brief Assessment, Assessment/Plan   Last Updated: 03-Sep-15 08:48 by Sherlynn Stalls (MD)

## 2014-10-26 NOTE — Consult Note (Signed)
Admit Diagnosis:   ACUTE KIDNEY INJURY HEMATURIA: Onset Date: 06-Mar-2014, Status: Active, Description: ACUTE KIDNEY INJURY HEMATURIA    Hyperlipidemia:    Hypertension:    Kidney Stones:   Home Medications: Medication Instructions Status  Coreg CR 12.5 milligram(s) orally once a day Active  pravastatin 40 mg oral tablet 1 tab(s) orally once a day (at bedtime) Active  aspirin 325 mg oral tablet 1 tab(s) orally once a day Active  One Daily Multi-Essential Multiple Vitamins oral tablet 1 tab(s) orally once a day Active  Trilipix 135 mg oral delayed release capsule 1 cap(s) orally once a day Active   Lab Results:  Hepatic:  01-Sep-15 20:11   Bilirubin, Total 0.7  Alkaline Phosphatase 60 (46-116 NOTE: New Reference Range 01/22/14)  SGPT (ALT) 28 (14-63 NOTE: New Reference Range 01/22/14)  SGOT (AST) 33  Total Protein, Serum 7.9  Albumin, Serum 4.2  Routine Chem:  01-Sep-15 20:10   Result Comment URINE DIP - Unable to obtain valid dipstick results  - due to interference of gross blood in the  - specimen. SP GRAVITY - RAN BY REFRACTOMETER URINE MICROSCOPIC - *** THIS IS A CORRECTED REPORT ***  - PLEASE DISREGARD PREVIOUS RESULT.  - C/AMYCOYNE/2210/03-05-14/RWW  Result(s) reported on 05 Mar 2014 at 10:19PM.    20:11   Glucose, Serum  194  BUN  24  Creatinine (comp)  1.81  Sodium, Serum 142  Potassium, Serum 4.5  Chloride, Serum  109  CO2, Serum 25  Calcium (Total), Serum 9.4  Osmolality (calc) 292  eGFR (African American)  44  eGFR (Non-African American)  38 (eGFR values <63m/min/1.73 m2 may be an indication of chronic kidney disease (CKD). Calculated eGFR is useful in patients with stable renal function. The eGFR calculation will not be reliable in acutely ill patients when serum creatinine is changing rapidly. It is not useful in  patients on dialysis. The eGFR calculation may not be applicable to patients at the low and high extremes of body sizes,  pregnant women, and vegetarians.)  Anion Gap 8  Routine UA:  01-Sep-15 20:10   Color (UA) RED  Clarity (UA) CLOUDY  Glucose (UA) see comment  Bilirubin (UA) see comment  Ketones (UA) see comment  Specific Gravity (UA) 1.027  Blood (UA) see comment  pH (UA) see comment  Protein (UA) see comment  Nitrite (UA) SEE COMMENT  Leukocyte Esterase (UA) see comment  RBC (UA) TNTC  WBC (UA) 0-5  Bacteria (UA) -  Epithelial Cells (UA) - (Result(s) reported on 05 Mar 2014 at 10:19PM.)  Routine Coag:  01-Sep-15 20:11   Prothrombin  16.7  INR 1.4 (INR reference interval applies to patients on anticoagulant therapy. A single INR therapeutic range for coumarins is not optimal for all indications; however, the suggested range for most indications is 2.0 - 3.0. Exceptions to the INR Reference Range may include: Prosthetic heart valves, acute myocardial infarction, prevention of myocardial infarction, and combinations of aspirin and anticoagulant. The need for a higher or lower target INR must be assessed individually. Reference: The Pharmacology and Management of the Vitamin K  antagonists: the seventh ACCP Conference on Antithrombotic and Thrombolytic Therapy. CTAVWP.7948Sept:126 (3suppl): [id number] A HCT value >55% may artifactually increase the PT.  In one study,  the increase was an average of 25%. Reference:  "Effect on Routine and Special Coagulation Testing Values of Citrate Anticoagulant Adjustment in Patients with High HCT Values." American Journal of Clinical Pathology 2006;126:400-405.)  Routine Hem:  01-Sep-15 20:11  WBC (CBC)  17.6  RBC (CBC) 4.52  Hemoglobin (CBC) 14.3  Hematocrit (CBC) 41.9  Platelet Count (CBC) 204  MCV 93  MCH 31.6  MCHC 34.0  RDW 13.1  Neutrophil % 86.4  Lymphocyte % 7.6  Monocyte % 5.1  Eosinophil % 0.5  Basophil % 0.4  Neutrophil #  15.2  Lymphocyte # 1.3  Monocyte # 0.9  Eosinophil # 0.1  Basophil # 0.1 (Result(s) reported on 05 Mar 2014  at 08:26PM.)  02-Sep-15 07:57   WBC (CBC)  19.3  RBC (CBC)  4.33  Hemoglobin (CBC) 13.7  Hematocrit (CBC) 40.4  Platelet Count (CBC) 178  MCV 93  MCH 31.8  MCHC 34.0  RDW 13.4  Neutrophil % 86.8  Lymphocyte % 8.0  Monocyte % 4.6  Eosinophil % 0.0  Basophil % 0.6  Neutrophil #  16.7  Lymphocyte # 1.5  Monocyte # 0.9  Eosinophil # 0.0  Basophil # 0.1 (Result(s) reported on 06 Mar 2014 at 08:23AM.)   Radiology Results:  Radiology Results: CT:    01-Sep-15 22:44, CT Abdomen and Pelvis W/WO Contrast  CT Abdomen and Pelvis W/WO Contrast  REASON FOR EXAM:    (1) gross hematuria, lower abd pain, please contact   radiologist while performing  COMMENTS:       PROCEDURE: CT  - CT ABDOMEN / PELVIS  W/WO  - Mar 05 2014 10:44PM     CLINICAL DATA:  Gross hematuria    EXAM:  CT ABDOMEN AND PELVIS WITHOUT AND WITH CONTRAST    TECHNIQUE:  Multidetector CT imaging of the abdomen and pelvis was performed  following the standard protocol before and following the bolus  administration of intravenous contrast.  CONTRAST:  80 cc Isovue-300    COMPARISON:  None.    FINDINGS:  Mild atelectasis or scarring at the lung bases. Normal heart size.  Coronary artery calcifications.    Many small hypodensities scattered throughout the liver are  nonspecific, favored to reflect biliary hamartoma.    Cholelithiasis. No pericholecystic fluid or fat stranding. No  biliary ductal dilatation.    No appreciable abnormality of the spleen, pancreas, adrenal glands.  Bilateral renal stones, the largest of which is within the lower  pole on the right, measuring 1.4 cm. There is mild left  hydroureteronephrosis to the level of a 1.1 cm stone within the  distal left ureter (series 2, image 79). This is nonobstructing  however as there is symmetric delayed excretion.    A 1.1 cm bladder calculus is present. The bladder is distended by a  large blood clot. A Foley catheter is in place. On delayed  imaging,  there is irregular thickening along the posterior left bladder wall  (series 12, image 71). Small amount of perivesicular fat  stranding/ill-defined fluid.    No colitis. Appendix not identified. No right lower quadrant  inflammation. Small bowel loops are of normal course and caliber. No  free intraperitoneal air. No lymphadenopathy.  Scattered atherosclerotic disease of the aorta and branch vessels  without aneurysmal dilatation.    Multilevel degenerative change L4-5 disc bulge with moderate central  canal narrowing. No acute osseous finding.     IMPRESSION:  Bilateral nonobstructing renal stones.    Mild fullness of the left renal collecting system and ureter  (without delayed excretion) to the level of a 1.1 cm distal left  ureteral stone.    Large blood clot within the bladder and intraluminal calculus.  Adherent clot versus irregular thickening  along the posterior left  bladder wall, malignancy not excluded.    Small amount of perivesicular fat stranding and ill-defined fluid  presumably reflects reactive change or cystitis. Correlate with  urinalysis.    Recommend urology consultation.      Electronically Signed    By: Carlos Levering M.D.    On: 03/05/2014 23:31         Verified By: Tommi Rumps, M.D.,    No Known Allergies:   Nursing Flowsheets: **Vital Signs.:   02-Sep-15 13:24  Vital Signs Type Routine  Temperature Temperature (F) 98.3  Celsius 36.8  Temperature Source oral  Pulse Pulse 62  Respirations Respirations 18  Systolic BP Systolic BP 229  Diastolic BP (mmHg) Diastolic BP (mmHg) 75  Mean BP 99  Pulse Ox % Pulse Ox % 96  Pulse Ox Activity Level  At rest  Oxygen Delivery Room Air/ 21 %    Present Illness 69 yo M presenting [facility] overnight to the ED with 3 days of worsening hematuria.   He also discribes increasing difficulty voiding and dysuria.  In the ED, Foley catheter was placed at which time signficant gross  hematuria was noted and several clots were evaculated.  Eventually, his catheter was upsized to a 24 Fr 3 way and CBI was started.    CT scan showed 1.1 cm left distal ureteral stone with fullness of the left collecting system in addition to clot within the bladder and a bladder stone.    Upon admission, he was found to have a leukocytosis to 17.6 -->19 (this AM).  His Cr was also found to elevated to 1.81 (baseline unknown).   No fevers, chills, hypotension or other symptoms of sepsis.    NR 1.4 (not on coumadin).  He has been taking ASA 81 mg daily.    He does report baseline voiding issues including slow urinary stream, urinary frequency, incomplete bladder emptying and nocturia x 4.  Dr. Jacqlyn Larsen is his Urologist.  He takes no medications for his voiding symptoms.  No history of urinary retention.   Case History and Physical Exam:  Past Medical Health Hypertension   Past Surgical History None   Family History Non-Contributory   HEENT PERLA   Neck/Nodes Supple   Chest/Lungs Clear   Cardiovascular Normal Sinus Rhythm   Abdomen Benign  Bladder nonpalpable,  no CVA tenderness bilaterally   Genitalia Normal phallus, 24 Fr Foley in place   Rectal Not examined  Defer for now, will check in OR   Musculoskeletal Full range of motion   Neurological Grossly WNL   Skin Warm  Dry  WNL    Impression 69 yo M with gross hematuria, left obstructing ureteral stone admitted for bleeding and possible infection in setting of leukocytosis.  He currently shows no additional signs of sepsis but do recommend ureteral stent placement given rising white count for maximal urinary decompression.  Clot evaculation and fulgeration of bleeding vessels can be performed in the OR as well for his hematuria.  Suspect source of bleeding is multifactorial including possible UTI, enlarged prostate gland, kidney and bladder stones, ASA and elevated INR.   Plan -NPO -consent signed, procedure discussed in detail  including risk of temporary worsening sepsis, need for further procedures, bleeding, infection, damage to surrounding structures -additional abx in the form of IV Levaquin (already on ceftriaxone) -to OR this afternoon -discussed plan with his family as well   Electronic Signatures: Sherlynn Stalls (MD)  (Signed 02-Sep-15 16:04)  Authored: Health Issues, Significant Events - History, Home Medications, Labs, Radiology Results, Allergies, Vital Signs, General Aspect/Present Illness, History and Physical Exam, Impression/Plan   Last Updated: 02-Sep-15 16:04 by Sherlynn Stalls (MD)

## 2014-10-28 LAB — SURGICAL PATHOLOGY

## 2014-11-03 NOTE — Op Note (Signed)
PATIENT NAME:  Cory Hardin, Cory Hardin MR#:  [id number] DATE OF BIRTH:  April 01, 1946  DATE OF PROCEDURE:  08/07/2014  PREOPERATIVE DIAGNOSIS: Bladder tumor.   POSTOPERATIVE DIAGNOSIS: Bladder tumor.   PROCEDURE PERFORMED: Transurethral resection bladder tumor (small), instillation of mitomycin.   ATTENDING SURGEON: Sherlynn Stalls, MD   ANESTHESIA: General anesthesia with mask LMA.   ESTIMATED BLOOD LOSS: Minimal.   DRAINS: 32 French Foley catheter.   SPECIMENS: Bladder tumor.   COMPLICATIONS: None.   INDICATION: This is a 69 year old male who has multiple GU issues including a history of bladder cancer, which was incidentally discovered this past fall, at the time of clot evacuation and treatment of his bladder stone. He returned for routine surveillance cystoscopy and was found to have another small bladder tumor on the right anterior wall of the bladder. He was counseled to undergo TURBT, instillation of mitomycin to reduce the risk of recurrence. Risks and benefits of the procedure were explained in detail. The patient agreed to proceed as planned.   DESCRIPTION OF PROCEDURE: The patient was correctly identified in the preoperative holding area and informed consent was confirmed. He was brought to the operating suite and placed on the table in the supine position. At this time universal timeout protocol was performed. All team members were identified. Venodyne boots were placed and the patient was administered 500 mg of IV Levaquin in the perioperative period. He was then placed under general anesthesia at which time LMA was placed and he was repositioned in the dorsal lithotomy position. He was prepped and draped in the standard surgical fashion. At this point in time, a 49 French cystoscope was advanced per urethra into the bladder. The bladder was surveyed. It was mildly trabeculated and there was either a kidney stone or small bladder stone material, which was easily evacuated out. Bilateral  ureteral orifices were clearly identified. A stellate scar was seen on the right posterior wall of the bladder consistent with his previous TURBT site.   At this point in time, careful inspection of the bladder revealed 2 small papillary tumors located on the right anterior lateral wall of the bladder. Both 30 degree and 70 degree scope were used to insure that the entirety of the bladder was inspected. There were no other tumors identified. Cold cup biopsy forceps were then used to excise these tumors which were passed off the field and labeled as bladder tumor. The base of these biopsies were fulgurated using Bugbee for hemostasis. Once adequate hemostasis was achieved, the bladder was then drained and the scope was removed. A 16 French Foley catheter was inserted sterilely and the balloon was inflated with 10 mL of sterile water. Mitomycin was then carefully injected through the side port of the Foley bag into the bladder, a total of 40 mg diluted in 40 mL of sterile water, and the Foley was clamped. The patient was then returned to the supine position, reversed from anesthesia, and taken to the PACU in stable condition.   PLAN:  His mitomycin will remain in his bladder for approximately an hour, at which time his Foley catheter can be removed after the chemotherapy is drained. He will follow up in 3 months for his next surveillance cystoscopy.   Thanks for allowing me to participate in the care of this patient.    ____________________________ Sherlynn Stalls, MD ajb:LT D: 08/07/2014 14:29:48 ET T: 08/07/2014 16:38:20 ET JOB#: [id number]  cc: Sherlynn Stalls, MD, <Dictator> Sherlynn Stalls MD ELECTRONICALLY SIGNED 08/27/2014  12:35 

## 2014-11-13 ENCOUNTER — Encounter
Admission: RE | Admit: 2014-11-13 | Discharge: 2014-11-13 | Disposition: A | Discharge status: Home / Self Care | Payer: Medicare Other | Source: Ambulatory Visit | Attending: Urology | Admitting: Urology

## 2014-11-13 DIAGNOSIS — C674 Malignant neoplasm of posterior wall of bladder: Secondary | ICD-10-CM | POA: Insufficient documentation

## 2014-11-13 DIAGNOSIS — Z01812 Encounter for preprocedural laboratory examination: Secondary | ICD-10-CM | POA: Diagnosis not present

## 2014-11-13 HISTORY — DX: Essential (primary) hypertension: I10

## 2014-11-13 HISTORY — DX: Benign prostatic hyperplasia without lower urinary tract symptoms: N40.0

## 2014-11-13 HISTORY — DX: Malignant (primary) neoplasm, unspecified: C80.1

## 2014-11-13 HISTORY — DX: Chronic kidney disease, unspecified: N18.9

## 2014-11-13 LAB — BASIC METABOLIC PANEL
Anion gap: 8 (ref 5–15)
BUN: 26 mg/dL — ABNORMAL HIGH (ref 6–20)
CO2: 31 mmol/L (ref 22–32)
Calcium: 9.7 mg/dL (ref 8.9–10.3)
Chloride: 103 mmol/L (ref 101–111)
Creatinine, Ser: 1.51 mg/dL — ABNORMAL HIGH (ref 0.61–1.24)
GFR calc Af Amer: 53 mL/min — ABNORMAL LOW (ref >60)
GFR calc non Af Amer: 45 mL/min — ABNORMAL LOW (ref >60)
Glucose, Bld: 254 mg/dL — ABNORMAL HIGH (ref 65–99)
Potassium: 4 mmol/L (ref 3.5–5.1)
Sodium: 142 mmol/L (ref 135–145)

## 2014-11-13 NOTE — Patient Instructions (Signed)
  Your procedure is scheduled on:11/20/14 Report to Day Surgery. To find out your arrival time please call [phone] between 1PM - 3PM on 11/19/14.  Remember: Instructions that are not followed completely may result in serious medical risk, up to and including death, or upon the discretion of your surgeon and anesthesiologist your surgery may need to be rescheduled.    _x___ 1. Do not eat food or drink liquids after midnight. No gum chewing or hard candies.     _x___ 2. No Alcohol for 24 hours before or after surgery.   ____ 3. Bring all medications with you on the day of surgery if instructed.    __x__ 4. Notify your doctor if there is any change in your medical condition     (cold, fever, infections).     Do not wear jewelry, make-up, hairpins, clips or nail polish.  Do not wear lotions, powders, or perfumes. You may wear deodorant.  Do not shave 48 hours prior to surgery. Men may shave face and neck.  Do not bring valuables to the hospital.    [facility] is not responsible for any belongings or valuables.               Contacts, dentures or bridgework may not be worn into surgery.  Leave your suitcase in the car. After surgery it may be brought to your room.  For patients admitted to the hospital, discharge time is determined by your                treatment team.   Patients discharged the day of surgery will not be allowed to drive home.   Please read over the following fact sheets that you were given:     ____ Take these medicines the morning of surgery with A SIP OF WATER:    1.coreg  2. trilipix  3.   4.  5.  6.  ____ Fleet Enema (as directed)   ____ Use CHG Soap as directed  ____ Use inhalers on the day of surgery  ____ Stop metformin 2 days prior to surgery    ____ Take 1/2 of usual insulin dose the night before surgery and none on the morning of surgery.   ____ Stop Coumadin/Plavix/aspirin on   ____ Stop Anti-inflammatories on   ____ Stop supplements  until after surgery.    ____ Bring C-Pap to the hospital.

## 2014-11-19 NOTE — H&P (Signed)
Saw C. Berens 11/05/2014 8:12 AM Location: [facility] Patient #: [id number] DOB: February 01, 1946 Single / Language: Cleophus Molt / Race: White Male    History of Present Illness(Kuzey Ogata Erlene Quan, MD; 11/05/2014 5:32 PM) The patient is a 69 year old male who presents with bladder cancer. Note for "Bladder cancer": 69 yo M seen initially as inpatient consult on 03/07/14 for management of gross hematuria. He was found to be in clot retention as well as found to have a 1.1 cm left distal ureteral calculus in the setting of leukocytosis.  He was taken to the OR for clot evatuation, evacution of bladder stones, left ureteral stent placement, and incidentially found to have several bladder tumors (3 tumors just beyond the right trigone, total cummulative size 3 cm) which were also resected at that time. 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 He has a recurrence and undenwent TURBT in 08/2014, pathology LgTA.  He returned to the OR and underwent staged left ureteroscopy to clear his left side as well as TURP on 04/17/14. Pathology for prostate chips negative for malignancy. Repeat biopsy of previously resected bladder tumor negative. He did have sigficant stent encrustation and is s/p stent removal. Follow-up renal ultrasound was negative for hydronephrosis.  He then underwent right ESWL by Dr. Elnoria Howard on 06/06/14 for treatment of right lower pole stones, measuring 1.4 cm conglomerate of 1.4 cm. Follow up KUB shows perhaps some change in the stones but overall faily minimal.  Mr. Griep does have a history of bladder stones s/p removal. He also has undergone PCNL for kidney stones. He is a former patient of Dr. Jacqlyn Larsen.   Stone analysis shows mostly calcium phosphate stone with some calcium oxalate. He underwent 24 hour urine anaalysis which shows elevated urinary calcium and was started on 12.5 mg hydrochlorothiazide daily. Repeat 24 hour shows no improvement in his urinary  calcium and lower urinary citrate. His HCTZ was increased to 25 mg daily. He is passed several stones in the past week andd continues to pass them on a regular basis.  He returns today for screening cystoscopy for history of bladder cancer.       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 s/p TURP on 04/17/14. Pathology negative. Bladder tumor (239.4  D49.4). h/o LgTa TCC dx 03/2014 HTN (hypertension) (401.9  I10) Renal failure (586  N19) Hematuria (599.70  R31.9)    Allergies(Sarah Watts; 11/05/2014 10:35 AM) No Known Drug Allergies. 03/19/2014    Family History(Sarah Watts; 11/05/2014 10:35 AM) None. 03/19/2014    Social History(Sarah Watts; 11/05/2014 10:35 AM) Tobacco use. Former smoker. Alcohol use. Non-drinker.    Travel History(Sarah Watts; 11/05/2014 10:35 AM) Have you traveled internationally in the last 21 days?Marland Kitchen No.    Past Surgical History(Sarah Watts; 11/05/2014 10:35 AM) TURBT 03/07/2014 Cystoscopy 03/07/2014 Evacuation of Bladder Stones 03/07/2014    Review of Systems(Sarah Watts; 11/05/2014 10:35 AM) General:Not Present- Chills, Fatigue, Fever and Weight Gain > 10lbs.. Skin:Not Present- Hair Loss, Pruritus and Rash. HEENT:Present- Blurred Vision. Not Present- Eye Pain, Decreased Hearing, Runny Nose, Snoring and Dry Mucous Membranes. Neck:Not Present- Neck Pain and Swollen Glands. Respiratory:Not Present- Chronic Cough, Difficulty Breathing on Exertion and Wakes up from Sleep Wheezing or Short of Breath. Breast:Not Present- Gynecomastia. Cardiovascular:Not Present-  Edema, Palpitations and Shortness of  Breath. Gastrointestinal:Present- Constipation, Heartburn, Indigestion and Nausea. Not Present- Diarrhea and Vomiting. Male Genitourinary:Present- Blood in Urine, Dysuria, Frequency, Kidney Stones, Nocturia and Urine Leakage. Note:see HPI Musculoskeletal:Not Present- Back Pain and Joint Pain. Neurological:Present- Dizziness. Not Present- Decreased Memory, Headaches and Seizures. Psychiatric:Not Present- Anxiety and Depression. Endocrine:Not Present- Excessive Sweating, Heat Intolerance and Tired/Sluggish. Hematology:Not Present- Abnormal Bleeding, Anemia, Blood Transfusion and Enlarged Lymph Nodes.    Physical Guinevere Ferrari, MD; 11/05/2014 11:00 AM) The physical exam findings are as follows:   General Mental Status- Alert. General Appearance- Cooperative. Build & Nutrition- Well nourished and Well developed.   Head and Neck Head- normocephalic, atraumatic with no lesions or palpable masses.   Chest and Lung Exam Chest and lung exam reveals - normal excursion with symmetric chest walls and quiet, even and easy respiratory effort with no use of accessory muscles.   Cardiovascular Cardiovascular examination reveals - no digital clubbing, cyanosis, edema, increased warmth or tenderness.   Abdomen Palpation/Percussion:Palpation and Percussion of the abdomen reveal - Non Tender, No hepatosplenomegaly and No Palpable abdominal masses. Bladder- Non-palpable. Kidney (Left):Other Characteristics- Non Tender. Kidney (Right):Other Characteristics- Non Tender. Auscultation:Auscultation of the abdomen reveals - Bowel sounds normal.   Male Genitourinary Evaluation of genitourinary system reveals - scrotum non-tender, no masses, normal testes, no palpable masses, urethral meatus normal, no discharge and normal penis.   Neurologic Neurologic evaluation reveals - alert and oriented x 3 with no impairment of recent or remote  memory.   Neuropsychiatric Mental status exam performed with findings of - able to articulate well with normal speech/language, rate, volume and coherence.    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 Recurrent 08/2014 LgTa. Impression: Cystoscopy today shows ulceration with concern for tumor recurrence on the left posterior wall of the bladder. Recommend repeat TURBT (bladder bx) with instillation of mitomycin. Discussed risks with patient including risk of damage to bladder, hematuria, infection, and need for Foley. Also discussed risks of mitomycin, primaily bladder irritation or inabiltiy to tolerate. -schedule bladder bx / mitomycin Current Plans l Pt Education - How to access health information online: discussed with patient and provided information. l URINALYSIS, AUTOMATED W/ MICRO (- [facility] -) (81001) l Started Ciprofloxacin HCl 500MG , 1 (one) Tablet one time dose, #1, 1 day starting 11/05/2014, No Refill. Given once in office prior to procedure. l URINE CULTURE, COMPREHENSIVE (89211) l Cysto / Separate (52000) l Male cystoscopy  Cystoscopy Procedure (Note)  Patient identification was confirmed, informed consent was obtained, and patient was prepped using Betadine solution. Lidocaine jelly was administered per urethral meatus.  Preoperative abx where received prior to procedure.   Pre-Procedure: - Inspection reveals a normal caliber ureteral meatus. 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 Pathology negative. Impression: Voiding symptoms now improved following TURP.  Bilateral kidney stones (592.0  N20.0) Impression: History of bilateral kidney stones status post recent ureteroscopy and ESWL. He also has history of percutaneous nephrolithotomy. - Recommend follow-up KUB  prior to next visit - currently on hydrochlorothiazide 25 mg   Signed electronically by [name], MD (11/05/2014 5:39 PM)

## 2014-11-20 ENCOUNTER — Encounter
Admission: RE | Disposition: A | Discharge status: Home / Self Care | Payer: Self-pay | Source: Ambulatory Visit | Attending: Urology

## 2014-11-20 ENCOUNTER — Ambulatory Visit: Payer: Medicare Other | Admitting: Anesthesiology

## 2014-11-20 ENCOUNTER — Encounter: Payer: Self-pay | Admitting: Anesthesiology

## 2014-11-20 ENCOUNTER — Ambulatory Visit
Admission: RE | Admit: 2014-11-20 | Discharge: 2014-11-20 | Disposition: A | Discharge status: Home / Self Care | Payer: Medicare Other | Source: Ambulatory Visit | Attending: Urology | Admitting: Urology

## 2014-11-20 DIAGNOSIS — N189 Chronic kidney disease, unspecified: Secondary | ICD-10-CM | POA: Insufficient documentation

## 2014-11-20 DIAGNOSIS — I129 Hypertensive chronic kidney disease with stage 1 through stage 4 chronic kidney disease, or unspecified chronic kidney disease: Secondary | ICD-10-CM | POA: Insufficient documentation

## 2014-11-20 DIAGNOSIS — D090 Carcinoma in situ of bladder: Secondary | ICD-10-CM | POA: Insufficient documentation

## 2014-11-20 DIAGNOSIS — Z8551 Personal history of malignant neoplasm of bladder: Secondary | ICD-10-CM | POA: Insufficient documentation

## 2014-11-20 DIAGNOSIS — E785 Hyperlipidemia, unspecified: Secondary | ICD-10-CM | POA: Diagnosis not present

## 2014-11-20 DIAGNOSIS — N3080 Other cystitis without hematuria: Secondary | ICD-10-CM | POA: Diagnosis not present

## 2014-11-20 DIAGNOSIS — N210 Calculus in bladder: Secondary | ICD-10-CM | POA: Insufficient documentation

## 2014-11-20 DIAGNOSIS — N401 Enlarged prostate with lower urinary tract symptoms: Secondary | ICD-10-CM | POA: Insufficient documentation

## 2014-11-20 DIAGNOSIS — Z87442 Personal history of urinary calculi: Secondary | ICD-10-CM | POA: Insufficient documentation

## 2014-11-20 DIAGNOSIS — C679 Malignant neoplasm of bladder, unspecified: Secondary | ICD-10-CM | POA: Diagnosis present

## 2014-11-20 DIAGNOSIS — R319 Hematuria, unspecified: Secondary | ICD-10-CM | POA: Diagnosis not present

## 2014-11-20 HISTORY — PX: CYSTOSCOPY WITH BIOPSY: SHX5122

## 2014-11-20 LAB — GLUCOSE, CAPILLARY: Glucose-Capillary: 106 mg/dL — ABNORMAL HIGH (ref 65–99)

## 2014-11-20 SURGERY — CYSTOSCOPY, WITH BIOPSY
Anesthesia: General | Wound class: Clean Contaminated

## 2014-11-20 MED ORDER — PROMETHAZINE HCL 25 MG/ML IJ SOLN
6.2500 mg | Freq: Once | INTRAMUSCULAR | Status: AC
  Administered 2014-11-20: 6.25 mg via INTRAVENOUS

## 2014-11-20 MED ORDER — STERILE WATER FOR IRRIGATION IR SOLN
Status: DC | PRN
  Administered 2014-11-20: 4500 mL

## 2014-11-20 MED ORDER — SODIUM CHLORIDE 0.9 % IJ SOLN
INTRAMUSCULAR | Status: AC
  Filled 2014-11-20: qty 10

## 2014-11-20 MED ORDER — LIDOCAINE HCL (CARDIAC) 20 MG/ML IV SOLN
INTRAVENOUS | Status: DC | PRN
  Administered 2014-11-20: 60 mg via INTRAVENOUS

## 2014-11-20 MED ORDER — SODIUM CHLORIDE 0.9 % IV SOLN
INTRAVENOUS | Status: DC
  Administered 2014-11-20: 999 mL/h via INTRAVENOUS

## 2014-11-20 MED ORDER — FENTANYL CITRATE (PF) 100 MCG/2ML IJ SOLN
INTRAMUSCULAR | Status: AC
  Administered 2014-11-20: 25 ug via INTRAVENOUS
  Filled 2014-11-20: qty 2

## 2014-11-20 MED ORDER — MIDAZOLAM HCL 2 MG/2ML IJ SOLN
INTRAMUSCULAR | Status: DC | PRN
  Administered 2014-11-20: 2 mg via INTRAVENOUS

## 2014-11-20 MED ORDER — ONDANSETRON HCL 4 MG/2ML IJ SOLN
4.0000 mg | Freq: Once | INTRAMUSCULAR | Status: AC | PRN
  Administered 2014-11-20: 4 mg via INTRAVENOUS

## 2014-11-20 MED ORDER — GLYCOPYRROLATE 0.2 MG/ML IJ SOLN
INTRAMUSCULAR | Status: AC
  Administered 2014-11-20: 0.4 mg
  Filled 2014-11-20: qty 2

## 2014-11-20 MED ORDER — LACTATED RINGERS IV SOLN
INTRAVENOUS | Status: DC
  Administered 2014-11-20: 15:00:00 via INTRAVENOUS

## 2014-11-20 MED ORDER — FENTANYL CITRATE (PF) 100 MCG/2ML IJ SOLN
INTRAMUSCULAR | Status: DC | PRN
  Administered 2014-11-20 (×2): 50 ug via INTRAVENOUS

## 2014-11-20 MED ORDER — GLYCOPYRROLATE 0.2 MG/ML IJ SOLN: 0.4000 mg | Freq: Once | INTRAMUSCULAR | Status: DC

## 2014-11-20 MED ORDER — PROMETHAZINE HCL 25 MG/ML IJ SOLN
INTRAMUSCULAR | Status: AC
  Filled 2014-11-20: qty 1

## 2014-11-20 MED ORDER — FENTANYL CITRATE (PF) 100 MCG/2ML IJ SOLN
25.0000 ug | INTRAMUSCULAR | Status: DC | PRN
  Administered 2014-11-20 (×4): 25 ug via INTRAVENOUS

## 2014-11-20 MED ORDER — PROPOFOL 10 MG/ML IV BOLUS
INTRAVENOUS | Status: DC | PRN
  Administered 2014-11-20: 170 mg via INTRAVENOUS

## 2014-11-20 MED ORDER — SODIUM CHLORIDE 0.9 % IV SOLN
INTRAVENOUS | Status: DC | PRN
  Administered 2014-11-20: 15:00:00 via INTRAVENOUS

## 2014-11-20 MED ORDER — MITOMYCIN CHEMO FOR BLADDER INSTILLATION 40 MG
40.0000 mg | Freq: Once | INTRAVENOUS | Status: AC
Start: 2014-11-20 — End: 2014-11-20
  Administered 2014-11-20: 40 mg via INTRAVESICAL
  Filled 2014-11-20: qty 40

## 2014-11-20 MED ORDER — LEVOFLOXACIN IN D5W 500 MG/100ML IV SOLN
500.0000 mg | Freq: Once | INTRAVENOUS | Status: AC
  Administered 2014-11-20: 500 mg via INTRAVENOUS

## 2014-11-20 MED ORDER — ONDANSETRON HCL 4 MG/2ML IJ SOLN
INTRAMUSCULAR | Status: AC
  Administered 2014-11-20: 4 mg via INTRAVENOUS
  Filled 2014-11-20: qty 2

## 2014-11-20 MED ORDER — ONDANSETRON HCL 4 MG/2ML IJ SOLN
INTRAMUSCULAR | Status: DC | PRN
  Administered 2014-11-20: 4 mg via INTRAVENOUS

## 2014-11-20 MED ORDER — LEVOFLOXACIN IN D5W 500 MG/100ML IV SOLN
INTRAVENOUS | Status: AC
Start: 2014-11-20 — End: 2014-11-20
  Administered 2014-11-20: 500 mg via INTRAVENOUS
  Filled 2014-11-20: qty 100

## 2014-11-20 SURGICAL SUPPLY — 25 items
BAG DRAIN CYSTO-URO LG1000N (MISCELLANEOUS) ×3 IMPLANT
BAG URO DRAIN 2000ML W/SPOUT (MISCELLANEOUS) ×3 IMPLANT
CATH FOLEY 2WAY  5CC 16FR (CATHETERS) ×2
CATH URTH 16FR FL 2W BLN LF (CATHETERS) ×1 IMPLANT
CORD URO TURP 10FT (MISCELLANEOUS) ×3 IMPLANT
ELECT LOOP MED HF 24F 12D (CUTTING LOOP) ×3 IMPLANT
ELECT URO BUGBEE 5F (MISCELLANEOUS) ×3
ELECTRODE URO BUGBEE 5F (MISCELLANEOUS) ×1 IMPLANT
GLOVE BIO SURGEON STRL SZ 6.5 (GLOVE) ×2 IMPLANT
GLOVE BIO SURGEON STRL SZ7 (GLOVE) ×6 IMPLANT
GLOVE BIO SURGEONS STRL SZ 6.5 (GLOVE) ×1
GOWN STRL REUS W/ TWL LRG LVL3 (GOWN DISPOSABLE) ×2 IMPLANT
GOWN STRL REUS W/TWL LRG LVL3 (GOWN DISPOSABLE) ×4
JELLY LUB 2OZ STRL (MISCELLANEOUS) ×2
JELLY LUBE 2OZ STRL (MISCELLANEOUS) ×1 IMPLANT
KIT RM TURNOVER CYSTO AR (KITS) ×3 IMPLANT
LOOP CUT RT ANGL 28F (MISCELLANEOUS) ×3 IMPLANT
PACK CYSTO AR (MISCELLANEOUS) ×3 IMPLANT
PAD GROUND ADULT SPLIT (MISCELLANEOUS) ×3 IMPLANT
PREP PVP WINGED SPONGE (MISCELLANEOUS) ×3 IMPLANT
ROLLER BALL 3MM 24FR (ELECTROSURGICAL) ×3 IMPLANT
SET IRRIG Y TYPE TUR BLADDER L (SET/KITS/TRAYS/PACK) ×3 IMPLANT
SYRINGE IRR TOOMEY STRL 70CC (SYRINGE) ×3 IMPLANT
WATER STERILE IRR 1000ML POUR (IV SOLUTION) ×3 IMPLANT
WATER STERILE IRR 3000ML UROMA (IV SOLUTION) ×6 IMPLANT

## 2014-11-20 NOTE — Discharge Instructions (Addendum)
Transurethral Resection of Bladder Tumor (TURBT) or Bladder Biopsy   Definition:  Transurethral Resection of the Bladder Tumor is a surgical procedure used to diagnose and remove tumors within the bladder. TURBT is the most common treatment for early stage bladder cancer.  General instructions:     Your recent bladder surgery requires very little post hospital care but some definite precautions.  Despite the fact that no skin incisions were used, the area around the bladder incisions are raw and covered with scabs to promote healing and prevent bleeding. Certain precautions are needed to insure that the scabs are not disturbed over the next 2-4 weeks while the healing proceeds.  Because the raw surface inside your bladder and the irritating effects of urine you may expect frequency of urination and/or urgency (a stronger desire to urinate) and perhaps even getting up at night more often. This will usually resolve or improve slowly over the healing period. You may see some blood in your urine over the first 6 weeks. Do not be alarmed, even if the urine was clear for a while. Get off your feet and drink lots of fluids until clearing occurs. If you start to pass clots or don't improve call us.  Diet:  You may return to your normal diet immediately. Because of the raw surface of your bladder, alcohol, spicy foods, foods high in acid and drinks with caffeine may cause irritation or frequency and should be used in moderation. To keep your urine flowing freely and avoid constipation, drink plenty of fluids during the day (8-10 glasses). Tip: Avoid cranberry juice because it is very acidic.  Activity:  Your physical activity doesn't need to be restricted. However, if you are very active, you may see some blood in the urine. We suggest that you reduce your activity under the circumstances until the bleeding has stopped.  Bowels:  It is important to keep your bowels regular during the postoperative  period. Straining with bowel movements can cause bleeding. A bowel movement every other day is reasonable. Use a mild laxative if needed, such as milk of magnesia 2-3 tablespoons, or 2 Dulcolax tablets. Call if you continue to have problems. If you had been taking narcotics for pain, before, during or after your surgery, you may be constipated. Take a laxative if necessary.    Medication:  You should resume your pre-surgery medications unless told not to. In addition you may be given an antibiotic to prevent or treat infection. Antibiotics are not always necessary. All medication should be taken as prescribed until the bottles are finished unless you are having an unusual reaction to one of the drugs.   [facility] [address] [place],  [address] [phone]    AMBULATORY SURGERY  DISCHARGE INSTRUCTIONS   1) The drugs that you were given will stay in your system until tomorrow so for the next 24 hours you should not:  A) Drive an automobile B) Make any legal decisions C) Drink any alcoholic beverage   2) You may resume regular meals tomorrow.  Today it is better to start with liquids and gradually work up to solid foods.  You may eat anything you prefer, but it is better to start with liquids, then soup and crackers, and gradually work up to solid foods.   3) Please notify your doctor immediately if you have any unusual bleeding, trouble breathing, redness and pain at the surgery site, drainage, fever, or pain not relieved by medication. 4)  5) Your post-operative visit with Dr.    George Ina                                 is: Date:                        Time:    Please call to schedule your post-operative visit.  6) Additional Instructions: 7)

## 2014-11-20 NOTE — Op Note (Signed)
Date of procedure: 11/20/2014  Preoperative diagnosis:  1. Bladder cancer 2. Bladder lesion   Postoperative diagnosis:  1. Same as above   Procedure: 1. Cystoscopy with bladder biopsy 2. Fulguration of bladder lesion 3. Instillation of intravesical chemotherapy (mitomycin)  Surgeon: [name], MD  Anesthesia: General  Complications: None  Intraoperative findings: Multiple loose stones within the bladder. Cluster of stones adherent to a lesion on the right lateral wall of the bladder adjacent to the previous TURBT scar.  EBL: Minimal  Specimens: Right lateral bladder lesion  Drains: 16 French Foley catheter  Indication: Cory Hardin is a 69 y.o. patient with history of recurrent superficial bladder cancer. He underwent routine office cystoscopy which revealed a lesion with adherent debris/stones concerning for recurrent tumor.  After reviewing the management options for treatment, he elected to proceed with the above surgical procedure(s). We have discussed the potential benefits and risks of the procedure, side effects of the proposed treatment, the likelihood of the patient achieving the goals of the procedure, and any potential problems that might occur during the procedure or recuperation. Informed consent has been obtained.  Description of procedure:  The patient was taken to the operating room and general anesthesia was induced.  The patient was placed in the dorsal lithotomy position, prepped and draped in the usual sterile fashion, and preoperative antibiotics were administered. A preoperative time-out was performed.   A 22 French rigid cystoscope was advanced per urethra into the bladder. Of note a TURP defect was noted. Upon entry into the bladder, several loose bladder stones were noted which are quite small and able to be crushed and irrigated out of the 22 [place] access sheath. The remainder of the bladder was then carefully inspected. It was mildly trabeculated and  the trigone appeared normal. There was an area with adherent stones on the surface on the right lateral wall of the bladder adjacent to previous TURBT site. It was unclear if there was any evidence of tumor underlying these stones but given the densely adherent stones, there was concern that this represented a recurrent malignancy of the bladder. The stones were freed off of the surface and a series of cold cup biopsies were taken from the base of this lesion. The base of the lesion was fulgurated using Bugbee electrocautery. The remainder of the bladder was carefully inspected and was noted to be free of any tumors. Both the 70 and 30 lens were used for careful inspection.  There was no active bleeding noted. The bladder was then drained. A 16 French Foley catheter was then placed and the bladder was drained. 40 cc of 40 mg solution of mitomycin was instilled into the bladder and the Foley was clamped.  Patient was then repositioned in the supine position, reversed from anesthesia, and taken to the PACU in stable condition. There were no occasions in this case.  [name], M.D.

## 2014-11-20 NOTE — Progress Notes (Signed)
cbg 107 Dr Erlene Quan called about color pink, vss stills feels woozy.  Offered to left him be admitted observation pt declines.  Will continue to observe.

## 2014-11-20 NOTE — Brief Op Note (Signed)
11/20/2014  4:13 PM  PATIENT:  Cory Hardin  69 y.o. male  PRE-OPERATIVE DIAGNOSIS:  BLADDER CANCER  POST-OPERATIVE DIAGNOSIS:  BLADDER CANCER  PROCEDURE:  Procedure(s): CYSTOSCOPY WITH BIOPSY (N/A)  Instillation of intravesical chemotherapy, mitomycin  SURGEON:  Surgeon(s) and Role:    * [name], MD - Primary  ASSISTANTS: none   ANESTHESIA:   general  EBL:  Total I/O In: 600 [I.V.:600] Out: -   Drains: 16 Fr Foley  Specimen: right lateral wall baldder lesion  COUNTS CORRECT: YES  PLAN OF CARE: Discharge to home after PACU  PATIENT DISPOSITION:  PACU - hemodynamically stable.

## 2014-11-20 NOTE — Anesthesia Postprocedure Evaluation (Signed)
  Anesthesia Post-op Note  Patient: Cory Hardin  Procedure(s) Performed: Procedure(s): CYSTOSCOPY WITH BIOPSY (N/A)  Anesthesia type:General  Patient location: PACU  Post pain: Pain level controlled  Post assessment: Post-op Vital signs reviewed, Patient's Cardiovascular Status Stable, Respiratory Function Stable, Patent Airway and No signs of Nausea or vomiting  Post vital signs: Reviewed and stable  Last Vitals:  Filed Vitals:   11/20/14 1617  BP: 151/91  Pulse: 52  Temp:   Resp: 8    Level of consciousness: awake, alert  and patient cooperative  Complications: No apparent anesthesia complications

## 2014-11-20 NOTE — Interval H&P Note (Signed)
History and Physical Interval Note:  11/20/2014 2:59 PM  Cory Hardin  has presented today for surgery, with the diagnosis of BLADDER CANCER  The various methods of treatment have been discussed with the patient and family. After consideration of risks, benefits and other options for treatment, the patient has consented to  Procedure(s): CYSTOSCOPY WITH BIOPSY (N/A), instillation of mitomycin as a surgical intervention .  The patient's history has been reviewed, patient examined, no change in status, stable for surgery.  I have reviewed the patient's chart and labs.  Questions were answered to the patient's satisfaction.     [name]

## 2014-11-20 NOTE — Anesthesia Procedure Notes (Signed)
Procedure Name: LMA Insertion Date/Time: 11/20/2014 3:25 PM Performed by: Dionne Bucy Patient Re-evaluated:Patient Re-evaluated prior to inductionOxygen Delivery Method: Circle system utilized Preoxygenation: Pre-oxygenation with 100% oxygen Intubation Type: IV induction Ventilation: Mask ventilation without difficulty LMA: LMA inserted LMA Size: 4.0 Number of attempts: 1 Placement Confirmation: positive ETCO2 and breath sounds checked- equal and bilateral Tube secured with: Tape Dental Injury: Teeth and Oropharynx as per pre-operative assessment

## 2014-11-20 NOTE — Progress Notes (Signed)
1915 Phenergan 6.25mg  iv given diluted in 9 ml saline.  Vomited 50 ml yellow liquid.  Voided, states feels better. VSS

## 2014-11-20 NOTE — Anesthesia Preprocedure Evaluation (Addendum)
Anesthesia Evaluation  Patient identified by MRN, date of birth, ID band Patient awake    Reviewed: Allergy & Precautions, NPO status , Patient's Chart, lab work & pertinent test results, reviewed documented beta blocker date and time   Airway Mallampati: II  TM Distance: >3 FB Neck ROM: Full    Dental  (+) Chipped   Pulmonary former smoker,          Cardiovascular hypertension,     Neuro/Psych    GI/Hepatic   Endo/Other    Renal/GU      Musculoskeletal   Abdominal   Peds  Hematology   Anesthesia Other Findings   Reproductive/Obstetrics                            Anesthesia Physical Anesthesia Plan  ASA: III  Anesthesia Plan: General   Post-op Pain Management:    Induction: Intravenous  Airway Management Planned: LMA  Additional Equipment:   Intra-op Plan:   Post-operative Plan:   Informed Consent: I have reviewed the patients History and Physical, chart, labs and discussed the procedure including the risks, benefits and alternatives for the proposed anesthesia with the patient or authorized representative who has indicated his/her understanding and acceptance.     Plan Discussed with: CRNA  Anesthesia Plan Comments:         Anesthesia Quick Evaluation

## 2014-11-20 NOTE — Transfer of Care (Signed)
Immediate Anesthesia Transfer of Care Note  Patient: Cory Hardin  Procedure(s) Performed: Procedure(s): CYSTOSCOPY WITH BIOPSY (N/A)  Patient Location: PACU  Anesthesia Type:General  Level of Consciousness: sedated  Airway & Oxygen Therapy: Patient Spontanous Breathing and Patient connected to face mask oxygen  Post-op Assessment: Report given to RN and Post -op Vital signs reviewed and stable  Post vital signs: Reviewed and stable  Last Vitals:  Filed Vitals:   11/20/14 1617  BP: 151/91  Pulse: 52  Temp: 96.9  Resp: 8    Complications: No apparent anesthesia complications

## 2014-11-20 NOTE — Progress Notes (Signed)
1800 Patient sitting up in chair, broke out in a warm sweat, bp stable 147/74, heart rate his usual at 48-52.  States if I stood up I would probably fall out.  Slightly pale, alert obivious doesn't feel good.  LR wide open, sister at bedside.  Anesthesia aware.  Bp 159/70, pulse = 47 sats 93.

## 2014-11-20 NOTE — Progress Notes (Signed)
Dr Andree Elk called Robinol 0.4 mg iv given as ordered for heart rate 47 at 1810.  By 3810 Hear rate 64, color pink lips less pale, patient states he feels better.  Dr Andree Elk in to evaluate patient.

## 2014-11-21 ENCOUNTER — Encounter: Payer: Self-pay | Admitting: Urology

## 2014-11-25 LAB — SURGICAL PATHOLOGY

## 2014-12-05 ENCOUNTER — Encounter: Payer: Self-pay | Admitting: *Deleted

## 2014-12-05 ENCOUNTER — Ambulatory Visit (INDEPENDENT_AMBULATORY_CARE_PROVIDER_SITE_OTHER): Payer: Medicare Other | Admitting: Urology

## 2014-12-05 VITALS — BP 142/83 | HR 60 | Wt 191.7 lb

## 2014-12-05 DIAGNOSIS — N400 Enlarged prostate without lower urinary tract symptoms: Secondary | ICD-10-CM | POA: Diagnosis not present

## 2014-12-05 DIAGNOSIS — C672 Malignant neoplasm of lateral wall of bladder: Secondary | ICD-10-CM | POA: Insufficient documentation

## 2014-12-05 DIAGNOSIS — N200 Calculus of kidney: Secondary | ICD-10-CM

## 2014-12-05 HISTORY — DX: Malignant neoplasm of lateral wall of bladder: C67.2

## 2014-12-05 NOTE — Progress Notes (Signed)
12/05/2014 11:11 AM   Cory Hardin 1945-07-31 [id number]  Referring provider: Albina Billet, MD [address]   [place], [place] [address]  Chief Complaint  Patient presents with  . Follow-up    surgical pathology done 11/20/14    HPI: 69 yo M seen initially as inpatient consult on 03/07/14 for management of gross hematuria. He was found to be in clot retention as well as found to have a 1.1 cm left distal ureteral calculus in the setting of leukocytosis.  He was taken to the OR for clot evatuation, evacution of bladder stones, left ureteral stent placement, and incidentially found to have several bladder tumors (3 tumors just beyond the right trigone, total cummulative size 3 cm) which were also resected at that time. 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 He has a recurrence and undenwent TURBT in 08/2014, pathology LgTA.  He returned to the OR and underwent staged left ureteroscopy to clear his left side as well as TURP on 04/17/14. Pathology for prostate chips negative for malignancy. Repeat biopsy of previously resected bladder tumor negative. He did have sigficant stent encrustation and is s/p stent removal. Follow-up renal ultrasound was negative for hydronephrosis.  He then underwent right ESWL by Dr. Elnoria Howard on 06/06/14 for treatment of right lower pole stones, measuring 1.4 cm conglomerate of 1.4 cm. Follow up KUB shows perhaps some change in the stones but overall faily minimal.  Cory Hardin does have a extensive history of bladder stones s/p removal. He also has undergone PCNL for kidney stones. He is a former patient of Dr. Jacqlyn Larsen.   Stone analysis shows mostly calcium phosphate stone with some calcium oxalate. He underwent 24 hour urine anaalysis which shows elevated urinary calcium and was started on 12.5 mg hydrochlorothiazide daily. Repeat 24 hour shows no improvement in his urinary calcium and lower urinary citrate. His HCTZ was increased to 25 mg  daily. He is passed several stones in the past week andd continues to pass them on a regular basis.  At his last office cystoscopy, he was noted to have a lesion within the bladder on the right lateral bladder wall with some underlying ulceration and calcification. He returned to the operating room on 5/18 for biopsy of the suspicious lesion.   He returns today to discuss the pathology results which are consistent with CIS, cystitis cystica, and focal eosinophilia.  DIAGNOSIS:  A. RIGHT BLADDER WALL LESION; BIOPSY:  - CARCINOMA IN SITU.  - CYSTITIS CYSTICA/GLANDULARIS.  - CALCIFICATIONS, FOCAL EOSINOPHILIA AND CHANGES CONSISTENT WITH  PREVIOUS SURGICAL PROCEDURE.    PMH: Past Medical History  Diagnosis Date  . Cancer     bladder  . BPH (benign prostatic hyperplasia)   . Hypertension   . Chronic kidney disease     stones,renal failure  . Hyperlipidemia   . Hematuria   . Bilateral kidney stones     Surgical History: Past Surgical History  Procedure Laterality Date  . Transurethral resection of bladder tumor with gyrus (turbt-gyrus)    . Cystoscopy    . Bladder stones    . Percutaneous nephrolithotripsy    . Appendectomy    . Cystoscopy with biopsy N/A 11/20/2014    Procedure: CYSTOSCOPY WITH BIOPSY;  Surgeon: [name], MD;  Location: [facility] ORS;  Service: Urology;  Laterality: N/A;    Home Medications:    Medication List       This list is accurate as of: 12/05/14 11:11 AM.  Always  use your most recent med list.               carvedilol 12.5 MG tablet  Commonly known as:  COREG  Take 12.5 mg by mouth 2 (two) times daily with a meal.     Choline Fenofibrate 135 MG capsule  Take 135 mg by mouth daily. am     hydrochlorothiazide 25 MG tablet  Commonly known as:  HYDRODIURIL  Take 1 tablet by mouth daily.     pravastatin 40 MG tablet  Commonly known as:  PRAVACHOL  Take 40 mg by mouth daily. pm        Allergies: No Known Allergies  Family  History: Family History  Problem Relation Age of Onset  . Heart disease Father   . Heart disease Mother   . Cancer Maternal Aunt   . Heart disease Brother     Social History:  reports that he quit smoking about 32 years ago. His smoking use included Cigarettes. He has a 3.75 pack-year smoking history. He has never used smokeless tobacco. He reports that he drinks about 0.6 oz of alcohol per week. He reports that he does not use illicit drugs.  Physical Exam: BP 142/83 mmHg  Pulse 60  Wt 191 lb 11.2 oz (86.955 kg)  Constitutional:  Alert and oriented, No acute distress. HEENT: [place] AT, moist mucus membranes.  Trachea midline, no masses. Cardiovascular: No clubbing, cyanosis, or edema. Respiratory: Normal respiratory effort, no increased work of breathing. GI: Abdomen is soft, nontender, nondistended, no abdominal masses GU: No CVA tenderness. Skin: No rashes, bruises or suspicious lesions. Neurologic: Grossly intact, no focal deficits, moving all 4 extremities. Psychiatric: Normal mood and affect.  Laboratory Data: Lab Results  Component Value Date   WBC 6.0 08/01/2014   HGB 13.6 08/01/2014   HCT 41.0 08/01/2014   MCV 90 08/01/2014   PLT 209 08/01/2014    Lab Results  Component Value Date   CREATININE 1.51* 11/13/2014    No results found for: PSA  No results found for: TESTOSTERONE  No results found for: HGBA1C  Urinalysis No results found for: COLORURINE, APPEARANCEUR, LABSPEC, PHURINE, GLUCOSEU, HGBUR, BILIRUBINUR, KETONESUR, PROTEINUR, UROBILINOGEN, NITRITE, LEUKOCYTESUR   Assessment & Plan:    Problem List Items Addressed This Visit    Calcium nephrolithiasis    Status post multiple procedures including ureteroscopy 2, ESWL, and remote history of PCNL. 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  Today, we discussed the implications of CIS as well as the natural history of more aggressive bladder cancer. I have recommended initiation of BCG therapy starting 6 weeks after his TURBT. The risks of BCG were discussed in detail today along with the procedure itself.  We discussed the need for 6 weekly procedures and urinalysis prior to each treatment to rule out infection. Patient is aware of increased bladder irritative symptoms, hematuria, failure of the medication, and other more serious side  effects such as UTI or BCG sepsis. He is willing to proceed as recommended.      BPH (benign prostatic hyperplasia)     s/p TURP on 04/17/14. Pathology negative. Voiding well today         Return in about 4 weeks (around 01/02/2015) for BCG.  [facility] [address], [place], [place] [address] [phone]

## 2014-12-05 NOTE — Patient Instructions (Signed)
Bacillus Calmette-Guerin Live, BCG intravesical solution What is this medicine? BACILLUS CALMETTE-GUERIN LIVE, BCG (ba SIL us KAL met gay RAYN) is a bacteria solution. This medicine stimulates the immune system to ward off cancer cells. It is used to treat bladder cancer. This medicine may be used for other purposes; ask your health care provider or pharmacist if you have questions. COMMON BRAND NAME(S): Theracys, TICE BCG What should I tell my health care provider before I take this medicine? They need to know if you have any of these conditions: -aneurysm -blood in the urine -bladder biopsy within 2 weeks -fever or infection -immune system problems -leukemia -lymphoma -myasthenia gravis -need organ transplant -prosthetic device like arterial graft, artificial joint, prosthetic heart valve -recent or ongoing radiation therapy -tuberculosis -an unusual or allergic reaction to Bacillus Calmette-Guerin Live, BCG, latex, other medicines, foods, dyes, or preservatives -pregnant or trying to get pregnant -breast-feeding How should I use this medicine? This drug is given as a catheter infusion into the bladder. It is administered in a hospital or clinic by a specially trained health care professional. You will be given directions to follow before the treatment. Follow your doctor's directions carefully. Try to hold this medicine in your bladder for 2 hours after treatment. Talk to your pediatrician regarding the use of this medicine in children. Special care may be needed. Overdosage: If you think you have taken too much of this medicine contact a poison control center or emergency room at once. NOTE: This medicine is only for you. Do not share this medicine with others. What if I miss a dose? It is important not to miss your dose. Call your doctor or health care professional if you are unable to keep an appointment. What may interact with this medicine? -antibiotics -medicines to suppress  your immune system like chemotherapy agents or corticosteroids -medicine to treat tuberculosis This list may not describe all possible interactions. Give your health care provider a list of all the medicines, herbs, non-prescription drugs, or dietary supplements you use. Also tell them if you smoke, drink alcohol, or use illegal drugs. Some items may interact with your medicine. What should I watch for while using this medicine? Visit your doctor for checks on your progress. This drug may make you feel generally unwell. Contact your doctor if your symptoms last more than 2 days or if they get worse. Call your doctor right away if you have a severe or unusual symptom. Infection can be spread to others through contact with this medicine. To prevent the spread of infection follow your doctor's directions carefully after treatment. For the first 6 hours after each treatment, sit down on the toilet to urinate. After urinating, add 2 cups of bleach to the toilet bowl and let set for 15 minutes before flushing. Wash your hands before and after using the restroom. Drink water or other fluids as directed after treatment with this medicine. Do not become pregnant while taking this medicine. Women should inform their doctor if they wish to become pregnant or think they might be pregnant. There is a potential for serious side effects to an unborn child. Talk to your health care professional or pharmacist for more information. Do not breast-feed an infant while taking this medicine. What side effects may I notice from receiving this medicine? Side effects that you should report to your doctor or health care professional as soon as possible: -allergic reactions like skin rash, itching or hives, swelling of the face, lips, or tongue -signs of   notice from receiving this medicine?  Side effects that you should report to your doctor or health care professional as soon as possible:  -allergic reactions like skin rash, itching or hives, swelling of the face, lips, or tongue  -signs of infection - fever or chills, cough, sore throat, pain or difficulty passing urine  -signs of decreased red blood cells - unusually weak or tired, fainting spells,  lightheadedness  -blood in urine  -breathing problems  -cough  -eye pain, redness  -flu-like symptoms  -joint pain  -bladder-area pain for more than 2 days after treatment  -trouble passing urine or change in the amount of urine  -vomiting  -yellowing of the eyes or skin  Side effects that usually do not require immediate medical attention (report these side effects to your doctor or health care professional if they continue or are bothersome):  -bladder spasm  -burning when passing urine within 2 days of treatment  -feel need to pass urine often or wake up at night to pass urine  -loss of appetite  This list may not describe all possible side effects. Call your doctor for medical advice about side effects. You may report side effects to FDA at 1-800-FDA-1088.  Where should I keep my medicine?  This drug is given in a hospital or clinic and will not be stored at home.  NOTE: This sheet is a summary. It may not cover all possible information. If you have questions about this medicine, talk to your doctor, pharmacist, or health care provider.  © 2015, [employer]. (2007-09-21 13:54:01)

## 2014-12-05 NOTE — Assessment & Plan Note (Addendum)
s/p TURP on 04/17/14. Pathology negative. Voiding well today

## 2014-12-05 NOTE — Assessment & Plan Note (Addendum)
Status post multiple procedures including ureteroscopy 2, ESWL, and remote history of PCNL. -HCTZ f25 by mouth daily in attempt to lower urinary calcium -repeat KUB in 2-3 months to assess right lower pole stones -May consider referring patient to Fabio Asa at [facility] for further evaluation once BCG course completed, patient will check insurance coverage

## 2014-12-05 NOTE — Assessment & Plan Note (Signed)
Recurrent low-grade Ta bladder cancer first diagnosed 03/07/2014 with recurrence LgTa 2/016.  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  Today, we discussed the implications of CIS as well as the natural history of more aggressive bladder cancer. I have recommended initiation of BCG therapy starting 6 weeks after his TURBT. The risks of BCG were discussed in detail today along with the procedure itself.  We discussed the need for 6 weekly procedures and urinalysis prior to each treatment to rule out infection. Patient is aware of increased bladder irritative symptoms, hematuria, failure of the medication, and other more serious side effects such as UTI or BCG sepsis. He is willing to proceed as recommended.

## 2015-01-03 ENCOUNTER — Ambulatory Visit: Payer: Self-pay | Admitting: Urology

## 2015-01-14 ENCOUNTER — Ambulatory Visit (INDEPENDENT_AMBULATORY_CARE_PROVIDER_SITE_OTHER): Payer: Medicare Other | Admitting: Urology

## 2015-01-14 ENCOUNTER — Encounter: Payer: Self-pay | Admitting: Urology

## 2015-01-14 VITALS — BP 169/105 | HR 57 | Ht 72.0 in | Wt 193.4 lb

## 2015-01-14 DIAGNOSIS — N200 Calculus of kidney: Secondary | ICD-10-CM

## 2015-01-14 DIAGNOSIS — N400 Enlarged prostate without lower urinary tract symptoms: Secondary | ICD-10-CM

## 2015-01-14 DIAGNOSIS — C672 Malignant neoplasm of lateral wall of bladder: Secondary | ICD-10-CM | POA: Diagnosis not present

## 2015-01-14 LAB — URINALYSIS, COMPLETE
Bilirubin, UA: NEGATIVE
Glucose, UA: NEGATIVE
Ketones, UA: NEGATIVE
Nitrite, UA: NEGATIVE
Specific Gravity, UA: 1.02 (ref 1.005–1.030)
Urobilinogen, Ur: 0.2 mg/dL (ref 0.2–1.0)
pH, UA: 7 (ref 5.0–7.5)

## 2015-01-14 LAB — MICROSCOPIC EXAMINATION

## 2015-01-14 MED ORDER — LIDOCAINE HCL 2 % EX GEL
1.0000 "application " | Freq: Once | CUTANEOUS | Status: AC
  Administered 2015-01-14: 1 via URETHRAL

## 2015-01-14 MED ORDER — BCG LIVE 50 MG IS SUSR
3.2400 mL | Freq: Once | INTRAVESICAL | Status: AC
  Administered 2015-01-14: 81 mg via INTRAVESICAL

## 2015-01-14 MED ORDER — SODIUM CHLORIDE 0.9 % IJ SOLN
50.0000 mL | Freq: Once | INTRAMUSCULAR | Status: AC
  Administered 2015-01-14: 50 mL

## 2015-01-14 NOTE — Progress Notes (Signed)
BCG Bladder Instillation  BCG # 1  Due to Bladder Cancer patient is present today for a BCG treatment. Patient was cleaned and prepped in a sterile fashion with betadine and lidocaine 2% jelly was instilled into the urethra.  A 14FR catheter was inserted, urine return was noted 145ml, urine was yellow in color.  64ml of reconstituted BCG was instilled into the bladder. The catheter was then removed. Patient tolerated well, no complications were noted Patient was kept in the office for 30 minutes since to monitor for reaction since this was his first treatment.   Preformed by: Fonnie Jarvis, CMA  Follow up/ Additional notes: 1week for next BCG

## 2015-01-14 NOTE — Progress Notes (Signed)
8:22 PM   Cory Hardin 11-20-1945 [id number]  Referring provider: Albina Billet, MD [address]   [place], [place] [address]  Chief Complaint  Patient presents with  . BCG    HPI: 69 yo nephrolithiasis, recurrent bladder CA who presents today for his first treatment of BCG.  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 He has a recurrence and undenwent TURBT in 08/2014, pathology LgTA.  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  He has no urinary complains today.    PMH: Past Medical History  Diagnosis Date  . Cancer     bladder  . BPH (benign prostatic hyperplasia)   . Hypertension   . Chronic kidney disease     stones,renal failure  . Hyperlipidemia   . Hematuria   . Bilateral kidney stones     Surgical History: Past Surgical History  Procedure Laterality Date  . Transurethral resection of bladder tumor with gyrus (turbt-gyrus)    . Cystoscopy    . Bladder stones    . Percutaneous nephrolithotripsy    . Appendectomy    . Cystoscopy with biopsy N/A 11/20/2014    Procedure: CYSTOSCOPY WITH BIOPSY;  Surgeon: [name], MD;  Location: [facility] ORS;  Service: Urology;  Laterality: N/A;    Home Medications:    Medication List       This list is accurate as of: 01/14/15  8:22 PM.  Always use your most recent med list.               carvedilol 12.5 MG tablet  Commonly known as:  COREG  Take 12.5 mg by mouth 2 (two) times daily with a meal.     Choline Fenofibrate 135 MG capsule  Take 135 mg by mouth daily. am     hydrochlorothiazide 25 MG tablet  Commonly known as:  HYDRODIURIL  Take 1 tablet by mouth daily.     pravastatin 40 MG tablet  Commonly known as:  PRAVACHOL  Take 40 mg by mouth daily. pm        Allergies: No Known Allergies  Family History: Family History  Problem Relation Age of Onset  . Heart  disease Father   . Heart disease Mother   . Cancer Maternal Aunt   . Heart disease Brother     Social History:  reports that he quit smoking about 32 years ago. His smoking use included Cigarettes. He has a 3.75 pack-year smoking history. He has never used smokeless tobacco. He reports that he drinks about 0.6 oz of alcohol per week. He reports that he does not use illicit drugs.  Physical Exam: BP 169/105 mmHg  Pulse 57  Ht 6' (1.829 m)  Wt 193 lb 6.4 oz (87.726 kg)  BMI 26.22 kg/m2  Constitutional:  Alert and oriented, No acute distress. HEENT: [place] AT, moist mucus membranes.  Trachea midline, no masses. Cardiovascular: No clubbing, cyanosis, or edema. Respiratory: Normal respiratory effort, no increased work of breathing. GI: Abdomen is soft, nontender, nondistended, no abdominal masses GU: No CVA tenderness.  Normal circumcised phallus. Skin: No rashes, bruises or suspicious lesions. Neurologic: Grossly intact, no focal deficits, moving all 4 extremities. Psychiatric: Normal mood and affect.  Laboratory Data: Lab Results  Component Value Date   WBC 6.0 08/01/2014   HGB 13.6 08/01/2014   HCT 41.0 08/01/2014  MCV 90 08/01/2014   PLT 209 08/01/2014    Lab Results  Component Value Date   CREATININE 1.51* 11/13/2014     Urinalysis Results for orders placed or performed in visit on 01/14/15  Microscopic Examination  Result Value Ref Range   WBC, UA 6-10 (A) 0 -  5 /hpf   RBC, UA 3-10 (A) 0 -  2 /hpf   Epithelial Cells (non renal) 0-10 0 - 10 /hpf   Mucus, UA Present (A) Not Estab.   Bacteria, UA Moderate (A) None seen/Few  Urinalysis, Complete  Result Value Ref Range   Specific Gravity, UA 1.020 1.005 - 1.030   pH, UA 7.0 5.0 - 7.5   Color, UA Yellow Yellow   Appearance Ur Clear Clear   Leukocytes, UA 1+ (A) Negative   Protein, UA Trace (A) Negative/Trace   Glucose, UA Negative Negative   Ketones, UA Negative Negative   RBC, UA 1+ (A) Negative   Bilirubin, UA  Negative Negative   Urobilinogen, Ur 0.2 0.2 - 1.0 mg/dL   Nitrite, UA Negative Negative   Microscopic Examination See below:      Assessment & Plan:    1. 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  Tolerated BCG #1 today  Reviewed warning signs/ - Urinalysis, Complete - sodium chloride 0.9 % injection 50 mL; 50 mLs by Intracatheter route once. - bcg vaccine injection 81 mg; Instill 3.24 mLs (81 mg total) into the bladder once. - lidocaine (XYLOCAINE) 2 % jelly 1 application; Place 1 application into the urethra once.  2. Calcium nephrolithiasis Status post multiple procedures including ureteroscopy 2, ESWL, and remote history of PCNL. -HCTZ f25 by mouth daily in attempt to lower urinary calcium -repeat KUB in 2-3 months to assess right lower pole stones -May consider referring patient to Fabio Asa at [facility] for further evaluation once BCG course completed, patient will check insurance coverage  3. BPH (benign prostatic hyperplasia) s/p TURP on 04/17/14. Pathology negative. Voiding well  Return in 1 week for next [facility]  [facility] [address] [place], [place] [address] [phone]

## 2015-01-21 ENCOUNTER — Encounter: Payer: Self-pay | Admitting: Urology

## 2015-01-21 ENCOUNTER — Ambulatory Visit (INDEPENDENT_AMBULATORY_CARE_PROVIDER_SITE_OTHER): Payer: Medicare Other | Admitting: Urology

## 2015-01-21 VITALS — BP 159/83 | HR 62 | Ht 72.0 in | Wt 189.2 lb

## 2015-01-21 DIAGNOSIS — C679 Malignant neoplasm of bladder, unspecified: Secondary | ICD-10-CM

## 2015-01-21 LAB — URINALYSIS, COMPLETE
Bilirubin, UA: NEGATIVE
Glucose, UA: NEGATIVE
Ketones, UA: NEGATIVE
Nitrite, UA: NEGATIVE
Specific Gravity, UA: >1.03 — ABNORMAL HIGH (ref 1.005–1.030)
Urobilinogen, Ur: 0.2 mg/dL (ref 0.2–1.0)
pH, UA: 5.5 (ref 5.0–7.5)

## 2015-01-21 LAB — MICROSCOPIC EXAMINATION

## 2015-01-21 MED ORDER — LIDOCAINE HCL 2 % EX GEL
1.0000 "application " | Freq: Once | CUTANEOUS | Status: AC
  Administered 2015-01-21: 1 via URETHRAL

## 2015-01-21 MED ORDER — BCG LIVE 50 MG IS SUSR
3.2400 mL | Freq: Once | INTRAVESICAL | Status: AC
  Administered 2015-01-21: 81 mg via INTRAVESICAL

## 2015-01-21 NOTE — Progress Notes (Signed)
01/21/2015 9:17 AM   Cory Hardin [birth year] [id number]  Referring provider: Albina Billet, MD [address]   [place], [place] [address]  Chief Complaint  Patient presents with  . 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 Erlene Quan.    He had his first BCG treatment last week. He stayed here for full 2 hours in our office. He did not experience any fevers, chills, vomiting or gross hematuria after the treatment. He is experiencing some mild intermittent nausea. He also denies any cough.  History of bladder Ca:  TURBT on 03/07/14 revealed LG Ta TCC. He has a recurrence and underwent TURBT in 08/2014, pathology LgTA. 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    PMH: Past Medical History  Diagnosis Date  . Cancer     bladder  . BPH (benign prostatic hyperplasia)   . Hypertension   . Chronic kidney disease     stones,renal failure  . Hyperlipidemia   . Hematuria   . Bilateral kidney stones     Surgical History: Past Surgical History  Procedure Laterality Date  . Transurethral resection of bladder tumor with gyrus (turbt-gyrus)    . Cystoscopy    . Bladder stones    . Percutaneous nephrolithotripsy    . Appendectomy    . Cystoscopy with biopsy N/A 11/20/2014    Procedure: CYSTOSCOPY WITH BIOPSY;  Surgeon: [name], MD;  Location: [facility] ORS;  Service: Urology;  Laterality: N/A;    Home Medications:    Medication List       This list is accurate as of: 01/21/15  9:17 AM.  Always use your most recent med list.               carvedilol 12.5 MG tablet  Commonly known as:  COREG  Take 12.5 mg by mouth 2 (two) times daily with a meal.     Choline  Fenofibrate 135 MG capsule  Take 135 mg by mouth daily. am     hydrochlorothiazide 25 MG tablet  Commonly known as:  HYDRODIURIL  Take 1 tablet by mouth daily.     pravastatin 40 MG tablet  Commonly known as:  PRAVACHOL  Take 40 mg by mouth daily. pm        Allergies: No Known Allergies  Family History: Family History  Problem Relation Age of Onset  . Heart disease Father   . Heart disease Mother   . Cancer Maternal Aunt   . Heart disease Brother   . Kidney disease Neg Hx   . Prostate cancer Neg Hx     Social History:  reports that he quit smoking about 32 years ago. His smoking use included Cigarettes. He has a 3.75 pack-year smoking history. He has never used smokeless tobacco. He reports that he drinks about 0.6 oz of alcohol per week. He reports that he does not use illicit drugs.   Physical Exam: BP 159/83 mmHg  Pulse 62  Ht 6' (1.829 m)  Wt 189 lb 3.2 oz (85.821 kg)  BMI 25.65 kg/m2   Laboratory Data: Results for orders placed or performed in visit on 01/21/15  Microscopic Examination  Result Value Ref Range   WBC, UA 0-5 0 -  5 /hpf   RBC, UA 3-10 (A) 0 -  2 /hpf   Epithelial Cells (non renal) 0-10 0 - 10 /hpf   Bacteria, UA Few None seen/Few  Urinalysis, Complete  Result Value Ref Range   Specific Gravity, UA >1.030 (H) 1.005 - 1.030   pH, UA 5.5 5.0 - 7.5   Color, UA Yellow Yellow   Appearance Ur Clear Clear   Leukocytes, UA Trace (A) Negative   Protein, UA 2+ (A) Negative/Trace   Glucose, UA Negative Negative   Ketones, UA Negative Negative   RBC, UA 2+ (A) Negative   Bilirubin, UA Negative Negative   Urobilinogen, Ur 0.2 0.2 - 1.0 mg/dL   Nitrite, UA Negative Negative   Microscopic Examination See below:    Lab Results  Component Value Date   WBC 6.0 08/01/2014   HGB 13.6 08/01/2014   HCT 41.0 08/01/2014   MCV 90 08/01/2014   PLT 209 08/01/2014    Lab Results  Component Value Date   CREATININE 1.51* 11/13/2014    No results found  for: PSA  No results found for: TESTOSTERONE  No results found for: HGBA1C  Urinalysis    Component Value Date/Time   GLUCOSEU Negative 01/14/2015 1123   BILIRUBINUR Negative 01/14/2015 1123   NITRITE Negative 01/14/2015 1123   LEUKOCYTESUR 1+* 01/14/2015 1123    Pertinent Imaging:   Assessment & Plan:    1. 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 Tolerated BCG #1 last week.  Reviewed warning signs/advised him to pour bleach in the toilet after he voids for the first time.   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 -HCTZ f25 by mouth daily in attempt to lower urinary calcium -repeat KUB in 2-3 months to assess right lower pole stones -May consider referring patient to Fabio Asa at [facility] for further evaluation once BCG course completed, patient will check insurance coverage  3. BPH (benign prostatic hyperplasia) s/p TURP on 04/17/14. Pathology negative. Voiding well  Return in 1 week for next BCG   - Urinalysis, Complete   No Follow-up on file.  Zara Council, [facility] [address], [place], [place] [address] [phone]

## 2015-01-21 NOTE — Progress Notes (Signed)
BCG Bladder Instillation  BCG # 2 Due to Bladder Cancer patient is present today for a BCG treatment. Patient was cleaned and prepped in a sterile fashion with betadine and lidocaine 2% jelly was instilled into the urethra.  A 14 FR Coude catheter was inserted, urine return was noted 78ml, urine was clear in color.  40ml of reconstituted BCG was instilled into the bladder. The catheter was then removed. Patient tolerated well, no complications were noted  Preformed by: Lyndee Hensen CMA  Follow up/ Additional notes: One week

## 2015-01-28 ENCOUNTER — Ambulatory Visit (INDEPENDENT_AMBULATORY_CARE_PROVIDER_SITE_OTHER): Payer: Medicare Other | Admitting: Urology

## 2015-01-28 ENCOUNTER — Encounter: Payer: Self-pay | Admitting: Urology

## 2015-01-28 VITALS — BP 152/78 | HR 60 | Resp 18 | Ht 72.0 in | Wt 190.0 lb

## 2015-01-28 DIAGNOSIS — C672 Malignant neoplasm of lateral wall of bladder: Secondary | ICD-10-CM

## 2015-01-28 DIAGNOSIS — C679 Malignant neoplasm of bladder, unspecified: Secondary | ICD-10-CM

## 2015-01-28 LAB — MICROSCOPIC EXAMINATION: RBC, UA: >30 /hpf — ABNORMAL HIGH (ref 0–?)

## 2015-01-28 LAB — URINALYSIS, COMPLETE
Bilirubin, UA: NEGATIVE
Glucose, UA: NEGATIVE
Ketones, UA: NEGATIVE
Nitrite, UA: NEGATIVE
Specific Gravity, UA: 1.025 (ref 1.005–1.030)
Urobilinogen, Ur: 1 mg/dL (ref 0.2–1.0)
pH, UA: 5.5 (ref 5.0–7.5)

## 2015-01-28 MED ORDER — LIDOCAINE HCL 2 % EX GEL
1.0000 "application " | Freq: Once | CUTANEOUS | Status: AC
  Administered 2015-01-28: 1 via URETHRAL

## 2015-01-28 MED ORDER — BCG LIVE 50 MG IS SUSR
3.2400 mL | Freq: Once | INTRAVESICAL | Status: AC
  Administered 2015-01-28: 81 mg via INTRAVESICAL

## 2015-01-28 MED ORDER — SODIUM CHLORIDE 0.9 % IJ SOLN
50.0000 mL | Freq: Once | INTRAMUSCULAR | Status: AC
  Administered 2015-01-28: 50 mL

## 2015-01-28 NOTE — Progress Notes (Signed)
BCG Bladder Instillation  BCG # 3  Due to Bladder Cancer patient is present today for a BCG treatment. Patient was cleaned and prepped in a sterile fashion with betadine and lidocaine 2% jelly was instilled into the urethra.  A 14FR catheter was inserted, urine return was noted 15ml, urine was yellow in color.  66ml of reconstituted BCG was instilled into the bladder. The catheter was then removed. Patient tolerated well, no complications were noted  Preformed by: Golden Hurter, CMA  Follow up: 1 week for treatment #4

## 2015-01-28 NOTE — Progress Notes (Signed)
12:29 PM   Cory Hardin 1946-03-18 [id number]  Referring provider: Albina Billet, MD [address]   [place], [place] [address]  Chief Complaint  Patient presents with  . 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    He had his second BCG treatment last week. He was able to hold the instillation for the  full 2 hours in our office. He did not experience any fevers, chills, vomiting or gross hematuria after the treatment.  The nausea is experienced last week has abated.  He also denies any cough.  History of bladder Ca:  TURBT on 03/07/14 revealed LG Ta TCC. He has a recurrence and underwent TURBT in 08/2014, pathology LgTA. 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    PMH: Past Medical History  Diagnosis Date  . Cancer     bladder  . BPH (benign prostatic hyperplasia)   . Hypertension   . Chronic kidney disease     stones,renal failure  . Hyperlipidemia   . Hematuria   . Bilateral kidney stones     Surgical History: Past Surgical History  Procedure Laterality Date  . Transurethral resection of bladder tumor with gyrus (turbt-gyrus)    . Cystoscopy    . Bladder stones    . Percutaneous nephrolithotripsy    . Appendectomy    . Cystoscopy with biopsy N/A 11/20/2014    Procedure: CYSTOSCOPY WITH BIOPSY;  Surgeon: Cory Espy, MD;  Location: [facility] ORS;  Service: Urology;  Laterality: N/A;    Home Medications:    Medication List       This list is accurate as of: 01/28/15 12:29 PM.  Always use your most recent med list.               carvedilol 12.5 MG tablet  Commonly known as:  COREG  Take 12.5 mg by mouth 2 (two) times daily with a meal.     Choline  Fenofibrate 135 MG capsule  Take 135 mg by mouth daily. am     hydrochlorothiazide 25 MG tablet  Commonly known as:  HYDRODIURIL  Take 1 tablet by mouth daily.     pravastatin 40 MG tablet  Commonly known as:  PRAVACHOL  Take 40 mg by mouth daily. pm        Allergies: No Known Allergies  Family History: Family History  Problem Relation Age of Onset  . Heart disease Father   . Heart disease Mother   . Cancer Maternal Aunt   . Heart disease Brother   . Kidney disease Neg Hx   . Prostate cancer Neg Hx     Social History:  reports that he quit smoking about 32 years ago. His smoking use included Cigarettes. He has a 3.75 pack-year smoking history. He has never used smokeless tobacco. He reports that he drinks about 0.6 oz of alcohol per week. He reports that he does not use illicit drugs.   Physical Exam: BP 152/78 mmHg  Pulse 60  Resp 18  Ht 6' (1.829 m)  Wt 190 lb (86.183 kg)  BMI 25.76 kg/m2   Laboratory Data: Results for orders placed or performed in visit on 01/21/15  Microscopic Examination  Result Value Ref Range   WBC, UA 0-5 0 -  5 /hpf   RBC, UA 3-10 (A) 0 -  2 /hpf   Epithelial Cells (non renal) 0-10 0 - 10 /hpf   Bacteria, UA Few None seen/Few  Urinalysis, Complete  Result Value Ref Range   Specific Gravity, UA >1.030 (H) 1.005 - 1.030   pH, UA 5.5 5.0 - 7.5   Color, UA Yellow Yellow   Appearance Ur Clear Clear   Leukocytes, UA Trace (A) Negative   Protein, UA 2+ (A) Negative/Trace   Glucose, UA Negative Negative   Ketones, UA Negative Negative   RBC, UA 2+ (A) Negative   Bilirubin, UA Negative Negative   Urobilinogen, Ur 0.2 0.2 - 1.0 mg/dL   Nitrite, UA Negative Negative   Microscopic Examination See below:    Results for orders placed or performed in visit on 01/21/15  Microscopic Examination  Result Value Ref Range   WBC, UA 0-5 0 -  5 /hpf   RBC, UA 3-10 (A) 0 -  2 /hpf   Epithelial Cells (non renal) 0-10 0 - 10 /hpf   Bacteria, UA Few  None seen/Few  Urinalysis, Complete  Result Value Ref Range   Specific Gravity, UA >1.030 (H) 1.005 - 1.030   pH, UA 5.5 5.0 - 7.5   Color, UA Yellow Yellow   Appearance Ur Clear Clear   Leukocytes, UA Trace (A) Negative   Protein, UA 2+ (A) Negative/Trace   Glucose, UA Negative Negative   Ketones, UA Negative Negative   RBC, UA 2+ (A) Negative   Bilirubin, UA Negative Negative   Urobilinogen, Ur 0.2 0.2 - 1.0 mg/dL   Nitrite, UA Negative Negative   Microscopic Examination See below:    Lab Results  Component Value Date   WBC 6.0 08/01/2014   HGB 13.6 08/01/2014   HCT 41.0 08/01/2014   MCV 90 08/01/2014   PLT 209 08/01/2014    Lab Results  Component Value Date   CREATININE 1.51* 11/13/2014    No results found for: PSA  No results found for: TESTOSTERONE  No results found for: HGBA1C  Urinalysis    Component Value Date/Time   COLORURINE Yellow 08/01/2014 1146   APPEARANCEUR Clear 08/01/2014 1146   LABSPEC 1.012 08/01/2014 1146   PHURINE 7.0 08/01/2014 1146   GLUCOSEU Negative 01/21/2015 0831   GLUCOSEU Negative 08/01/2014 1146   HGBUR Negative 08/01/2014 1146   BILIRUBINUR Negative 01/21/2015 0831   BILIRUBINUR Negative 08/01/2014 1146   KETONESUR Negative 08/01/2014 1146   PROTEINUR Negative 08/01/2014 1146   NITRITE Negative 01/21/2015 0831   NITRITE Negative 08/01/2014 1146   LEUKOCYTESUR Trace* 01/21/2015 0831   LEUKOCYTESUR Negative 08/01/2014 1146    Pertinent Imaging:   Assessment & Plan:    1. 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 He will need 6 weekly procedures.  Tolerated BCG #2  last week.  Reviewed warning signs/advised him to pour bleach in the toilet after he voids for the first time.   - Urinalysis, Complete  2. Calcium  nephrolithiasis Status post multiple procedures including ureteroscopy 2, ESWL, and remote history of PCNL. -HCTZ f25 by mouth daily in attempt to lower urinary calcium -repeat KUB in 2-3 months to assess right lower pole stones -May consider referring  patient to Cory Hardin at [facility] for further evaluation once BCG course completed, patient will check insurance coverage  3. BPH (benign prostatic hyperplasia) s/p TURP on 04/17/14. Pathology negative. Voiding well  Return in 1 week for next BCG for fourth treatment (completed 3/6)     - Urinalysis, Complete   No Follow-up on file.  Zara Council, [facility] [address] [place], [place] [address] [phone]

## 2015-02-04 ENCOUNTER — Ambulatory Visit (INDEPENDENT_AMBULATORY_CARE_PROVIDER_SITE_OTHER): Payer: Medicare Other | Admitting: Urology

## 2015-02-04 DIAGNOSIS — C672 Malignant neoplasm of lateral wall of bladder: Secondary | ICD-10-CM

## 2015-02-04 LAB — MICROSCOPIC EXAMINATION

## 2015-02-04 LAB — URINALYSIS, COMPLETE
Bilirubin, UA: NEGATIVE
Glucose, UA: NEGATIVE
Ketones, UA: NEGATIVE
Nitrite, UA: NEGATIVE
Specific Gravity, UA: 1.02 (ref 1.005–1.030)
Urobilinogen, Ur: 0.2 mg/dL (ref 0.2–1.0)
pH, UA: 6 (ref 5.0–7.5)

## 2015-02-04 MED ORDER — SODIUM CHLORIDE 0.9 % IJ SOLN
50.0000 mL | Freq: Once | INTRAMUSCULAR | Status: DC
Start: 2015-02-04 — End: 2015-02-04

## 2015-02-04 MED ORDER — BCG LIVE 50 MG IS SUSR: 3.2400 mL | Freq: Once | INTRAVESICAL | Status: DC

## 2015-02-04 MED ORDER — LIDOCAINE HCL 2 % EX GEL: 1.0000 "application " | Freq: Once | CUTANEOUS | Status: DC

## 2015-02-06 ENCOUNTER — Telehealth: Payer: Self-pay | Admitting: *Deleted

## 2015-02-06 LAB — CULTURE, URINE COMPREHENSIVE

## 2015-02-06 NOTE — Telephone Encounter (Signed)
-----   Message from [name], MD sent at 02/06/2015  7:57 AM EDT ----- Please let patient know that his urine culture was NEGATIVE.  Please ensure that he is asymptomatic.  If so, we can proceed with BCG next week as scheduled.    [name], MD

## 2015-02-06 NOTE — Telephone Encounter (Signed)
Voice message left requesting a call-back. (Next BCG scheduled for 02/12/2015.)

## 2015-02-06 NOTE — Telephone Encounter (Signed)
Pt returned call. Made aware of negative urine cx. Pt stated he was not have any UTI symptoms at this point. Made aware could proceed with BCG as planned. Pt voiced understanding.

## 2015-02-09 NOTE — Progress Notes (Signed)
BCG canceled for today 02/04/15. UA suspicious for infection although asymptomatic. We will send urine culture reschedule for next week.  [name], MD

## 2015-02-12 ENCOUNTER — Encounter: Payer: Self-pay | Admitting: Urology

## 2015-02-12 ENCOUNTER — Ambulatory Visit (INDEPENDENT_AMBULATORY_CARE_PROVIDER_SITE_OTHER): Payer: Medicare Other | Admitting: Urology

## 2015-02-12 VITALS — BP 172/89 | HR 67 | Ht 72.0 in | Wt 192.4 lb

## 2015-02-12 DIAGNOSIS — C679 Malignant neoplasm of bladder, unspecified: Secondary | ICD-10-CM | POA: Diagnosis not present

## 2015-02-12 LAB — URINALYSIS, COMPLETE
Bilirubin, UA: NEGATIVE
Glucose, UA: NEGATIVE
Ketones, UA: NEGATIVE
Nitrite, UA: NEGATIVE
Specific Gravity, UA: 1.02 (ref 1.005–1.030)
Urobilinogen, Ur: 0.2 mg/dL (ref 0.2–1.0)
pH, UA: 6 (ref 5.0–7.5)

## 2015-02-12 LAB — MICROSCOPIC EXAMINATION
Epithelial Cells (non renal): NONE SEEN /hpf (ref 0–10)
RBC, UA: >30 /hpf — ABNORMAL HIGH (ref 0–?)
WBC, UA: >30 /hpf — ABNORMAL HIGH (ref 0–?)

## 2015-02-12 MED ORDER — BCG LIVE 50 MG IS SUSR
3.2400 mL | Freq: Once | INTRAVESICAL | Status: AC
  Administered 2015-02-12: 81 mg via INTRAVESICAL

## 2015-02-12 MED ORDER — LIDOCAINE HCL 2 % EX GEL
1.0000 "application " | Freq: Once | CUTANEOUS | Status: AC
  Administered 2015-02-12: 1 via URETHRAL

## 2015-02-12 MED ORDER — SODIUM CHLORIDE 0.9 % IJ SOLN
50.0000 mL | Freq: Once | INTRAMUSCULAR | Status: AC
  Administered 2015-02-12: 50 mL

## 2015-02-12 NOTE — Progress Notes (Signed)
BCG Bladder Instillation  BCG # 4  Due to Bladder Cancer patient is present today for a BCG treatment. Patient was cleaned and prepped in a sterile fashion with betadine and lidocaine 2% jelly was instilled into the urethra.  A 14FR catheter was inserted, urine return was not noted due to patient emptying bladder well.  45ml of reconstituted BCG was instilled into the bladder. The catheter was then removed. Patient tolerated well, no complications were noted  Preformed by: Lyndee Hensen CMA and Fonnie Jarvis CMA  Follow up/ Additional notes: Per Dr. Elnoria Howard ok to go ahead with the BCG today. Follow up one week

## 2015-02-18 ENCOUNTER — Ambulatory Visit (INDEPENDENT_AMBULATORY_CARE_PROVIDER_SITE_OTHER): Payer: Medicare Other | Admitting: Urology

## 2015-02-18 ENCOUNTER — Encounter: Payer: Self-pay | Admitting: Urology

## 2015-02-18 VITALS — BP 159/84 | HR 62 | Ht 72.0 in | Wt 192.6 lb

## 2015-02-18 DIAGNOSIS — C679 Malignant neoplasm of bladder, unspecified: Secondary | ICD-10-CM

## 2015-02-18 LAB — URINALYSIS, COMPLETE
Bilirubin, UA: NEGATIVE
Glucose, UA: NEGATIVE
Ketones, UA: NEGATIVE
Nitrite, UA: NEGATIVE
Specific Gravity, UA: 1.02 (ref 1.005–1.030)
Urobilinogen, Ur: 0.2 mg/dL (ref 0.2–1.0)
pH, UA: 5.5 (ref 5.0–7.5)

## 2015-02-18 LAB — MICROSCOPIC EXAMINATION
Epithelial Cells (non renal): NONE SEEN /hpf (ref 0–10)
RBC, UA: >30 /hpf — ABNORMAL HIGH (ref 0–?)

## 2015-02-18 MED ORDER — BCG LIVE 50 MG IS SUSR
3.2400 mL | Freq: Once | INTRAVESICAL | Status: AC
  Administered 2015-02-18: 81 mg via INTRAVESICAL

## 2015-02-18 MED ORDER — SODIUM CHLORIDE 0.9 % IJ SOLN
50.0000 mL | Freq: Once | INTRAMUSCULAR | Status: AC
  Administered 2015-02-18: 50 mL

## 2015-02-18 MED ORDER — LIDOCAINE HCL 2 % EX GEL
1.0000 | Freq: Once | CUTANEOUS | Status: AC
Start: 2015-02-18 — End: 2015-02-18
  Administered 2015-02-18: 1 via URETHRAL

## 2015-02-18 NOTE — Progress Notes (Signed)
02/18/2015 8:50 AM   Cory Hardin [birth year] [id number]  Referring provider: Albina Billet, MD [address]   [place], [place] [address]  Chief Complaint  Patient presents with  . Bladder Cancer    5th BCG treatment    HPI: Cory Hardin is a 69 year old white male who was found to have CIS, cystitis cystica and focal eosinophilia of the right lateral bladder wall after a repeat TURBT on 11/20/2014 with Dr. Erlene Quan.   He had his fourth BCG treatment last week. He was able to hold the instillation for the full 2 hours in our office. He did not experience any fevers, chills, vomiting or gross hematuria after the treatment. The nausea is experienced last week has abated. He also denies any cough.  He was concerned that two individuals were coming into the room to instill the solution.  I explained to the patient that this was to lesson the exposure of infectious materials into his bladder.    History of bladder Ca: TURBT on 03/07/14 revealed LG Ta TCC. He has a recurrence and underwent TURBT in 08/2014, pathology LgTA. 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      PMH: Past Medical History  Diagnosis Date  . Cancer     bladder  . BPH (benign prostatic hyperplasia)   . Hypertension   . Chronic kidney disease     stones,renal failure  . Hyperlipidemia   . Hematuria   . Bilateral kidney stones     Surgical History: Past Surgical History  Procedure Laterality Date  . Transurethral resection of bladder tumor with gyrus (turbt-gyrus)    . Cystoscopy    . Bladder stones    . Percutaneous nephrolithotripsy    . Appendectomy    . Cystoscopy with biopsy N/A 11/20/2014    Procedure: CYSTOSCOPY WITH BIOPSY;  Surgeon: [name], MD;  Location: [facility] ORS;  Service: Urology;  Laterality: N/A;    Home Medications:    Medication List         This list is accurate as of: 02/18/15  8:50 AM.  Always use your most recent med list.               carvedilol 12.5 MG tablet  Commonly known as:  COREG  Take 12.5 mg by mouth 2 (two) times daily with a meal.     Choline Fenofibrate 135 MG capsule  Take 135 mg by mouth daily. am     hydrochlorothiazide 25 MG tablet  Commonly known as:  HYDRODIURIL  Take 1 tablet by mouth daily.     pravastatin 40 MG tablet  Commonly known as:  PRAVACHOL  Take 40 mg by mouth daily. pm        Allergies: No Known Allergies  Family History: Family History  Problem Relation Age of Onset  . Heart disease Father   . Heart disease Mother   . Cancer Maternal Aunt   . Heart disease Brother   . Kidney disease Neg Hx   . Prostate cancer Neg Hx     Social History:  reports that he quit smoking about 32 years ago. His smoking use included Cigarettes. He has a 3.75 pack-year smoking history. He has never used smokeless tobacco. He reports that he drinks about 0.6 oz of alcohol per week. He reports that he does not use illicit drugs.  Physical Exam: BP 159/84 mmHg  Pulse 62  Ht 6' (1.829 m)  Wt 192 lb 9.6 oz (87.363 kg)  BMI 26.12 kg/m2   Laboratory Data: Results for orders placed or performed in visit on 02/18/15  Microscopic Examination  Result Value Ref Range   WBC, UA 6-10 (A) 0 -  5 /hpf   RBC, UA >30 (H) 0 -  2 /hpf   Epithelial Cells (non renal) None seen 0 - 10 /hpf   Mucus, UA Present (A) Not Estab.  Urinalysis, Complete  Result Value Ref Range   Specific Gravity, UA 1.020 1.005 - 1.030   pH, UA 5.5 5.0 - 7.5   Color, UA Yellow Yellow   Appearance Ur Clear Clear   Leukocytes, UA 1+ (A) Negative   Protein, UA Trace (A) Negative/Trace   Glucose, UA Negative Negative   Ketones, UA Negative Negative   RBC, UA 3+ (A) Negative   Bilirubin, UA Negative Negative   Urobilinogen, Ur 0.2 0.2 - 1.0 mg/dL   Nitrite, UA Negative Negative   Microscopic Examination See below:    Lab  Results  Component Value Date   WBC 6.0 08/01/2014   HGB 13.6 08/01/2014   HCT 41.0 08/01/2014   MCV 90 08/01/2014   PLT 209 08/01/2014    Lab Results  Component Value Date   CREATININE 1.51* 11/13/2014    Assessment & Plan:    1. 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 He will need 6 weekly procedures. Tolerated BCG #4 last week.  Reviewed warning signs/advised him to pour bleach in the toilet after he voids for the first time.   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 -HCTZ f25 by mouth daily in attempt to lower urinary calcium -repeat KUB in 2-3 months to assess right lower pole stones -May consider referring patient to Fabio Asa at [facility] for further evaluation once BCG course completed, patient will check insurance coverage  3. BPH (benign prostatic hyperplasia) s/p TURP on 04/17/14. Pathology negative. Voiding well  Return in 1 week for next BCG for sixth treatment (completed 5/6)      No Follow-up on file.  Zara Council, [facility] [address], [place], [place] [address] [phone]

## 2015-02-18 NOTE — Progress Notes (Signed)
BCG Bladder Instillation  BCG # 5  Due to Bladder Cancer patient is present today for a BCG treatment. Patient was cleaned and prepped in a sterile fashion with betadine and lidocaine 2% jelly was instilled into the urethra.  A 14FR catheter was inserted, urine return was noted 67ml, urine was clear in color.  8ml of reconstituted BCG was instilled into the bladder. The catheter was then removed. Patient tolerated well, no complications were noted  Preformed by: Lyndee Hensen CMA and Zara Council PA-C  Follow up/ Additional notes: One week

## 2015-02-24 MED ORDER — BCG LIVE 50 MG IS SUSR: 0.2000 mL | Freq: Once | INTRAVESICAL | Status: DC

## 2015-02-24 MED ORDER — LIDOCAINE HCL 2 % EX GEL: 1.0000 "application " | Freq: Once | CUTANEOUS | Status: DC

## 2015-02-25 ENCOUNTER — Ambulatory Visit (INDEPENDENT_AMBULATORY_CARE_PROVIDER_SITE_OTHER): Payer: Medicare Other | Admitting: Urology

## 2015-02-25 VITALS — BP 137/77 | HR 61 | Wt 192.9 lb

## 2015-02-25 DIAGNOSIS — C672 Malignant neoplasm of lateral wall of bladder: Secondary | ICD-10-CM

## 2015-02-25 DIAGNOSIS — N400 Enlarged prostate without lower urinary tract symptoms: Secondary | ICD-10-CM

## 2015-02-25 DIAGNOSIS — N200 Calculus of kidney: Secondary | ICD-10-CM

## 2015-02-25 LAB — URINALYSIS, COMPLETE
Bilirubin, UA: NEGATIVE
Glucose, UA: NEGATIVE
Ketones, UA: NEGATIVE
Nitrite, UA: NEGATIVE
Specific Gravity, UA: 1.025 (ref 1.005–1.030)
Urobilinogen, Ur: 0.2 mg/dL (ref 0.2–1.0)
pH, UA: 5 (ref 5.0–7.5)

## 2015-02-25 LAB — MICROSCOPIC EXAMINATION: Epithelial Cells (non renal): NONE SEEN /hpf (ref 0–10)

## 2015-02-25 MED ORDER — BCG LIVE 50 MG IS SUSR
3.2400 mL | Freq: Once | INTRAVESICAL | Status: AC
  Administered 2015-02-25: 81 mg via INTRAVESICAL

## 2015-02-25 MED ORDER — SODIUM CHLORIDE 0.9 % IJ SOLN
50.0000 mL | Freq: Once | INTRAMUSCULAR | Status: AC
  Administered 2015-02-25: 50 mL

## 2015-02-25 MED ORDER — LIDOCAINE HCL 2 % EX GEL
1.0000 "application " | Freq: Once | CUTANEOUS | Status: AC
  Administered 2015-02-25: 1 via URETHRAL

## 2015-02-25 NOTE — Progress Notes (Signed)
10:54 AM   Cory Hardin Sep 17, 1945 [id number]  Referring provider: Albina Billet, MD [address]   [place], [place] [address]  Chief Complaint  Patient presents with  . BCG    HPI: 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 Erlene Hardin.   He had his fifth BCG treatment last week. He was able to hold the instillation for the full 2 hours in our office. He did not experience any fevers, chills, vomiting, nausea or gross hematuria after the treatment.   History of bladder Ca: TURBT on 03/07/14 revealed LG Ta TCC. He has a recurrence and underwent TURBT in 08/2014, pathology LgTA. 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   PMH: Past Medical History  Diagnosis Date  . Cancer     bladder  . BPH (benign prostatic hyperplasia)   . Hypertension   . Chronic kidney disease     stones,renal failure  . Hyperlipidemia   . Hematuria   . Bilateral kidney stones     Surgical History: Past Surgical History  Procedure Laterality Date  . Transurethral resection of bladder tumor with gyrus (turbt-gyrus)    . Cystoscopy    . Bladder stones    . Percutaneous nephrolithotripsy    . Appendectomy    . Cystoscopy with biopsy N/A 11/20/2014    Procedure: CYSTOSCOPY WITH BIOPSY;  Surgeon: [name], MD;  Location: [facility] ORS;  Service: Urology;  Laterality: N/A;    Home Medications:    Medication List       This list is accurate as of: 02/25/15 10:54 AM.  Always use your most recent med list.               carvedilol 12.5 MG tablet  Commonly known as:  COREG  Take 12.5 mg by mouth 2 (two) times daily with a meal.     Choline Fenofibrate 135 MG capsule  Take 135 mg by mouth daily. am     hydrochlorothiazide 25 MG tablet  Commonly known as:  HYDRODIURIL  Take 1 tablet by mouth daily.     pravastatin 40 MG tablet  Commonly known as:  PRAVACHOL  Take 40 mg by mouth daily. pm        Allergies: No Known Allergies  Family History: Family History  Problem Relation Age of Onset  . Heart disease Father   . Heart disease Mother   . Cancer Maternal Aunt   . Heart disease Brother   . Kidney disease Neg Hx   . Prostate cancer Neg Hx     Social History:  reports that he quit smoking about 32 years ago. His smoking use included Cigarettes. He has a 3.75 pack-year smoking history. He has never used smokeless tobacco. He reports that he drinks about 0.6 oz of alcohol per week. He reports that he does not use illicit drugs.   Physical Exam: BP 137/77 mmHg  Pulse 61  Wt 192 lb 14.4 oz (87.499 kg)   Laboratory Data: Results for orders placed or performed in visit on 02/25/15  Microscopic Examination  Result Value Ref Range   WBC, UA 6-10 (A) 0 -  5 /hpf   RBC, UA 0-2 0 -  2 /hpf  Epithelial Cells (non renal) None seen 0 - 10 /hpf   Mucus, UA Present (A) Not Estab.   Bacteria, UA Few (A) None seen/Few  Urinalysis, Complete  Result Value Ref Range   Specific Gravity, UA 1.025 1.005 - 1.030   pH, UA 5.0 5.0 - 7.5   Color, UA Yellow Yellow   Appearance Ur Clear Clear   Leukocytes, UA 1+ (A) Negative   Protein, UA Trace (A) Negative/Trace   Glucose, UA Negative Negative   Ketones, UA Negative Negative   RBC, UA 1+ (A) Negative   Bilirubin, UA Negative Negative   Urobilinogen, Ur 0.2 0.2 - 1.0 mg/dL   Nitrite, UA Negative Negative   Microscopic Examination See below:     Lab Results  Component Value Date   WBC 6.0 08/01/2014   HGB 13.6 08/01/2014   HCT 41.0 08/01/2014   MCV 90 08/01/2014   PLT 209 08/01/2014    Lab Results  Component Value Date   CREATININE 1.51* 11/13/2014    Assessment & Plan:    1. 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 He has completed his 6 weekly procedures. Tolerated BCG #5 last week.  Reviewed warning signs/advised him to pour bleach in the toilet after he voids for the first time.   - Urinalysis, Complete  Patient to return in 3 months for cystoscopy.  2. Calcium nephrolithiasis Status post multiple procedures including ureteroscopy 2, ESWL, and remote history of PCNL. -HCTZ f25 by mouth daily in attempt to lower urinary calcium -May consider referring patient to Cory Hardin at [facility] for further evaluation once BCG course completed, patient will check insurance coverage  Patient to have his KUB prior to his cystoscopy.    3. BPH (benign prostatic hyperplasia) s/p TURP on 04/17/14. Pathology negative. Voiding well   Return in about 3 months (around 05/28/2015) for cystoscopy and KUB .  Cory Hardin, [facility] [address], [place], [place] [address] [phone]

## 2015-02-25 NOTE — Progress Notes (Signed)
BCG Bladder Instillation  BCG # 6  Due to Bladder Cancer patient is present today for a BCG treatment. Patient was cleaned and prepped in a sterile fashion with betadine and lidocaine 2% jelly was instilled into the urethra.  A 14FR catheter was inserted, urine return was noted 2ml, urine was yellow in color.  13ml of reconstituted BCG was instilled into the bladder. The catheter was then removed. Patient tolerated well, no complications were noted  Preformed by: Fonnie Jarvis, CMA, Zara Council, PAC  Follow up/ Additional notes: 3 month cysto

## 2015-05-28 ENCOUNTER — Encounter: Payer: Self-pay | Admitting: Urology

## 2015-05-28 ENCOUNTER — Other Ambulatory Visit: Payer: Medicare Other

## 2015-05-28 ENCOUNTER — Ambulatory Visit (INDEPENDENT_AMBULATORY_CARE_PROVIDER_SITE_OTHER): Payer: Medicare Other | Admitting: Urology

## 2015-05-28 ENCOUNTER — Ambulatory Visit
Admission: RE | Admit: 2015-05-28 | Discharge: 2015-05-28 | Disposition: A | Discharge status: Home / Self Care | Payer: Medicare Other | Source: Ambulatory Visit | Attending: Urology | Admitting: Urology

## 2015-05-28 VITALS — BP 128/84 | HR 61 | Ht 72.0 in | Wt 191.6 lb

## 2015-05-28 DIAGNOSIS — N200 Calculus of kidney: Secondary | ICD-10-CM | POA: Insufficient documentation

## 2015-05-28 DIAGNOSIS — C679 Malignant neoplasm of bladder, unspecified: Secondary | ICD-10-CM

## 2015-05-28 LAB — URINALYSIS, COMPLETE
Bilirubin, UA: NEGATIVE
Glucose, UA: NEGATIVE
Ketones, UA: NEGATIVE
Nitrite, UA: NEGATIVE
Specific Gravity, UA: >1.03 — ABNORMAL HIGH (ref 1.005–1.030)
Urobilinogen, Ur: 0.2 mg/dL (ref 0.2–1.0)
pH, UA: 5.5 (ref 5.0–7.5)

## 2015-05-28 LAB — MICROSCOPIC EXAMINATION

## 2015-05-28 MED ORDER — CIPROFLOXACIN HCL 500 MG PO TABS: 500.0000 mg | ORAL_TABLET | Freq: Once | ORAL | Status: DC

## 2015-05-28 MED ORDER — LIDOCAINE HCL 2 % EX GEL: 1.0000 "application " | Freq: Once | CUTANEOUS | Status: DC

## 2015-05-30 LAB — CULTURE, URINE COMPREHENSIVE

## 2015-06-02 ENCOUNTER — Telehealth: Payer: Self-pay | Admitting: Urology

## 2015-06-02 NOTE — Telephone Encounter (Signed)
Please advise. Results are in micro tab.

## 2015-06-02 NOTE — Telephone Encounter (Signed)
Pt came in last week for a cysto & had an infection.  Didn't get started on an antibiotic and wants to know if he is supposed to be on something before his next cysto which was rescheduled for 12/5.

## 2015-06-02 NOTE — Telephone Encounter (Signed)
Culture came back negative. We can proceed with cysto without any antibiotic

## 2015-06-09 ENCOUNTER — Ambulatory Visit
Admission: RE | Admit: 2015-06-09 | Discharge: 2015-06-09 | Disposition: A | Discharge status: Home / Self Care | Payer: Medicare Other | Source: Ambulatory Visit | Attending: Urology | Admitting: Urology

## 2015-06-09 ENCOUNTER — Ambulatory Visit (INDEPENDENT_AMBULATORY_CARE_PROVIDER_SITE_OTHER): Payer: Medicare Other | Admitting: Urology

## 2015-06-09 VITALS — BP 204/101 | HR 69 | Ht 72.0 in | Wt 193.3 lb

## 2015-06-09 DIAGNOSIS — C672 Malignant neoplasm of lateral wall of bladder: Secondary | ICD-10-CM

## 2015-06-09 DIAGNOSIS — R399 Unspecified symptoms and signs involving the genitourinary system: Secondary | ICD-10-CM | POA: Diagnosis not present

## 2015-06-09 DIAGNOSIS — N200 Calculus of kidney: Secondary | ICD-10-CM | POA: Diagnosis not present

## 2015-06-09 DIAGNOSIS — K8020 Calculus of gallbladder without cholecystitis without obstruction: Secondary | ICD-10-CM | POA: Diagnosis not present

## 2015-06-09 DIAGNOSIS — N400 Enlarged prostate without lower urinary tract symptoms: Secondary | ICD-10-CM | POA: Diagnosis not present

## 2015-06-09 LAB — MICROSCOPIC EXAMINATION
RBC, UA: >30 /hpf — ABNORMAL HIGH (ref 0–?)
WBC, UA: >30 /hpf — ABNORMAL HIGH (ref 0–?)

## 2015-06-09 LAB — URINALYSIS, COMPLETE
Bilirubin, UA: NEGATIVE
Glucose, UA: NEGATIVE
Ketones, UA: NEGATIVE
Nitrite, UA: NEGATIVE
Specific Gravity, UA: >1.03 — ABNORMAL HIGH (ref 1.005–1.030)
Urobilinogen, Ur: 0.2 mg/dL (ref 0.2–1.0)
pH, UA: 5 (ref 5.0–7.5)

## 2015-06-09 MED ORDER — CIPROFLOXACIN HCL 500 MG PO TABS
500.0000 mg | ORAL_TABLET | Freq: Once | ORAL | Status: AC
  Administered 2015-06-09: 500 mg via ORAL

## 2015-06-09 MED ORDER — LIDOCAINE HCL 2 % EX GEL
1.0000 "application " | Freq: Once | CUTANEOUS | Status: AC
  Administered 2015-06-09: 1 via URETHRAL

## 2015-06-09 NOTE — Progress Notes (Addendum)
8:56 AM   Gustabo Bech Brzozowski 10-Mar-1946 [id number]  Referring provider: Albina Billet, MD [address]   [place], [place] [address]  Chief Complaint  Patient presents with  . Cysto    HPI: 69 yo recurrent nephrolithiasis, bladder stones, recurrent bladder CA, BPH s/p TURP who presents today his first surviellence cysto 3 months after completing induction BCG x 6.    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 He has a recurrence and undenwent TURBT in 08/2014, pathology LgTA.  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  S/p BCG x 6 completed 02/2015.  He has an extensive history of recurrent stones. He is undergone multiple stone procedures in the past. 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  Most recent KUB on 05/28/15 shows a new left lower pole renal calculus along with a 13 mm calcification in the possible right distal ureter.  He has been complaining of some right lower quadrant pain for the past 3 months which comes and goes.  He has no urinary complains today.  No dysuria, gross hematuria, fevers or chills. He has an excellent stream which is improved since his TURP.     PMH: Past Medical History  Diagnosis Date  . Cancer ([place])     bladder  . BPH (benign prostatic hyperplasia)   . Hypertension   . Chronic kidney disease     stones,renal failure  . Hyperlipidemia   . Hematuria   . Bilateral kidney stones   . Cancer of lateral wall of urinary bladder ([place]) 12/05/2014    Surgical History: Past Surgical History  Procedure Laterality Date  . Transurethral resection of bladder tumor with gyrus (turbt-gyrus)    . Cystoscopy    . Bladder stones    . Percutaneous nephrolithotripsy    . Appendectomy    . Cystoscopy with biopsy N/A 11/20/2014    Procedure: CYSTOSCOPY WITH BIOPSY;  Surgeon: [name], MD;  Location: [facility]  ORS;  Service: Urology;  Laterality: N/A;    Home Medications:    Medication List       This list is accurate as of: 06/09/15  8:27 AM.  Always use your most recent med list.               carvedilol 12.5 MG tablet  Commonly known as:  COREG  Take 12.5 mg by mouth 2 (two) times daily with a meal.     Choline Fenofibrate 135 MG capsule  Take 135 mg by mouth daily. am     glimepiride 1 MG tablet  Commonly known as:  AMARYL     hydrochlorothiazide 25 MG tablet  Commonly known as:  HYDRODIURIL  Take 1 tablet by mouth daily.     omeprazole 20 MG capsule  Commonly known as:  PRILOSEC     pravastatin 40 MG tablet  Commonly known as:  PRAVACHOL  Take 40 mg by mouth daily. pm        Allergies: No Known Allergies  Family History: Family History  Problem Relation Age of Onset  . Heart disease Father   . Heart disease Mother   . Cancer Maternal Aunt   . Heart disease Brother   . Kidney disease Neg Hx   . Prostate cancer Neg Hx     Social History:  reports that he quit smoking about 32 years  ago. His smoking use included Cigarettes. He has a 3.75 pack-year smoking history. He has never used smokeless tobacco. He reports that he drinks about 0.6 oz of alcohol per week. He reports that he does not use illicit drugs.  Physical Exam: BP 204/101 mmHg  Pulse 69  Ht 6' (1.829 m)  Wt 193 lb 4.8 oz (87.68 kg)  BMI 26.21 kg/m2  Constitutional:  Alert and oriented, No acute distress. HEENT: [place] AT, moist mucus membranes.  Trachea midline, no masses. Cardiovascular: No clubbing, cyanosis, or edema.  RRR. Respiratory: Normal respiratory effort, no increased work of breathing.  CTAB. GI: Abdomen is soft, nontender, nondistended, no abdominal masses GU: No CVA tenderness.  Normal circumcised phallus. Skin: No rashes, bruises or suspicious lesions. Neurologic: Grossly intact, no focal deficits, moving all 4 extremities. Psychiatric: Normal mood and affect.  Laboratory Data: Lab  Results  Component Value Date   WBC 6.0 08/01/2014   HGB 13.6 08/01/2014   HCT 41.0 08/01/2014   MCV 90 08/01/2014   PLT 209 08/01/2014    Lab Results  Component Value Date   CREATININE 1.51* 11/13/2014     Urinalysis UA today with many RBC/ WBC, however, nitrite negative.  UCx last week NEGATIVE.     Study Result     CLINICAL DATA: History of kidney stones and bladder cancer, previous history of lithotripsy  EXAM: ABDOMEN - 1 VIEW  COMPARISON: KUB of 07/23/2014  FINDINGS: The larger calculus in the lower pole of the right collecting system has undergone lithotripsy, with only small right renal calculi present. However, there is an oval calcification overlying the low right pelvis of 13 mm in diameter which was not present previously and could represent a distal right ureteral calculus. Additional stone fragments are noted presumably post lithotripsy most likely within the urinary bladder. There are left lower pole renal calculi now noted. The bowel gas pattern is nonspecific.  IMPRESSION: 1. Post lithotripsy there are calcifications probably within urinary bladder consistent with fragmented and passed calculi. 2. In addition there is a 13 mm calcification in the region of the expected distal right ureter which may represent a calculus as well. 3. Bilateral renal calculi.   Electronically Signed  By: Ivar Drape M.D.  On: 05/28/2015 08:59       Cystoscopy Procedure Note  Patient identification was confirmed, informed consent was obtained, and patient was prepped using Betadine solution.  Lidocaine jelly was administered per urethral meatus.    Preoperative abx where received prior to procedure.     Pre-Procedure: - Inspection reveals a normal caliber ureteral meatus.  Procedure: The flexible cystoscope was introduced without difficulty - No urethral strictures/lesions are present. - Normal prostate open prostatic urethra with TURP  defect, relatively short length - Normal bladder neck - Bilateral ureteral orifices identified - Bladder mucosa  reveals flat somewhat erythematous lesion on the posterior right wall of bladder with overlying calcification adherent to the mucosa, somewhat suspicious for underlying tumor.  No other obvious lesions. - No loose bladder stones - No trabeculation  Retroflexion unremarkable.  Post-Procedure: - Patient tolerated the procedure well    Assessment & Plan:    1. 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  He completed 6 week course of BCG  completed  02/2015.  Cystoscopy today suspicious for flat erythematous  lesion with overlying calcification. Recommend proceeding with biopsy given his history of recurrent bladder cancer/CIS. -Plan for plan for small TURBT/ possible mitomycin -preop urine culture  2. Calcium nephrolithiasis Status post multiple procedures including ureteroscopy 2, ESWL, and remote history of PCNL.  KUB today shows a 13 mm right distal stone from which he is symptomatic. I have recommended proceeding with right ureteroscopy laser lithotripsy and stent placement. He's had this procedure before and all the questions were answered. I stressed the importance of more urgent intervention should he develop any significant urinary symptoms, fevers, chills, malaise, or any other worrisome changes given the presence of his presumably obstructing stone.  We will discuss treatment of his new left lower pole stones after his obstructing stone is treated. -HCTZ 25mg  continue -schedule right URS, LL, stent   3. BPH (benign prostatic hyperplasia) s/p TURP on 04/17/14. Pathology negative. Voiding well   [facility] [address], [place], [place] [address] [phone]

## 2015-06-09 NOTE — Addendum Note (Signed)
Addended by: Orlene Erm on: 06/09/2015 03:46 PM   Modules accepted: Orders

## 2015-06-10 ENCOUNTER — Telehealth: Payer: Self-pay | Admitting: Radiology

## 2015-06-10 ENCOUNTER — Telehealth: Payer: Self-pay | Admitting: *Deleted

## 2015-06-10 NOTE — Telephone Encounter (Signed)
-----   Message from [name], MD sent at 06/09/2015  5:54 PM EST ----- Please let Mr. Cory Hardin know that his stone in the ureter is still there, sorry.  The stone he passed was likely the calcification in his bladder.  [name], MD

## 2015-06-10 NOTE — Telephone Encounter (Signed)
Pt notified of surgery scheduled 06/23/15, pre-admit testing appt on 06/19/15 @7 :30 and to call Friday prior to surgery for arrival time to [facility]. Pt voices understanding.

## 2015-06-10 NOTE — Telephone Encounter (Signed)
Pt notified that his ureteral stone is still there.  Also notified pt of surgery scheduled 06/23/15 & to call Friday prior to surgery for arrival time to SDS. Pt will be notified of pre-admit testing appt when it is made. Pt voices understanding.

## 2015-06-10 NOTE — Telephone Encounter (Signed)
LMOM for patient to return call for results 

## 2015-06-11 LAB — CULTURE, URINE COMPREHENSIVE

## 2015-06-19 ENCOUNTER — Encounter
Admission: RE | Admit: 2015-06-19 | Discharge: 2015-06-19 | Disposition: A | Discharge status: Home / Self Care | Payer: Medicare Other | Source: Ambulatory Visit | Attending: Urology | Admitting: Urology

## 2015-06-19 DIAGNOSIS — Z01812 Encounter for preprocedural laboratory examination: Secondary | ICD-10-CM | POA: Diagnosis not present

## 2015-06-19 DIAGNOSIS — Z01810 Encounter for preprocedural cardiovascular examination: Secondary | ICD-10-CM | POA: Diagnosis present

## 2015-06-19 HISTORY — DX: Type 2 diabetes mellitus without complications: E11.9

## 2015-06-19 HISTORY — DX: Gastro-esophageal reflux disease without esophagitis: K21.9

## 2015-06-19 LAB — BASIC METABOLIC PANEL
Anion gap: 7 (ref 5–15)
BUN: 25 mg/dL — ABNORMAL HIGH (ref 6–20)
CO2: 29 mmol/L (ref 22–32)
Calcium: 9.4 mg/dL (ref 8.9–10.3)
Chloride: 106 mmol/L (ref 101–111)
Creatinine, Ser: 1.45 mg/dL — ABNORMAL HIGH (ref 0.61–1.24)
GFR calc Af Amer: 55 mL/min — ABNORMAL LOW (ref >60)
GFR calc non Af Amer: 47 mL/min — ABNORMAL LOW (ref >60)
Glucose, Bld: 113 mg/dL — ABNORMAL HIGH (ref 65–99)
Potassium: 3.9 mmol/L (ref 3.5–5.1)
Sodium: 142 mmol/L (ref 135–145)

## 2015-06-19 LAB — CBC
HCT: 42.2 % (ref 40.0–52.0)
Hemoglobin: 14.3 g/dL (ref 13.0–18.0)
MCH: 30.8 pg (ref 26.0–34.0)
MCHC: 33.8 g/dL (ref 32.0–36.0)
MCV: 91 fL (ref 80.0–100.0)
Platelets: 139 10*3/uL — ABNORMAL LOW (ref 150–440)
RBC: 4.63 MIL/uL (ref 4.40–5.90)
RDW: 13 % (ref 11.5–14.5)
WBC: 5.4 10*3/uL (ref 3.8–10.6)

## 2015-06-19 NOTE — Patient Instructions (Signed)
  Your procedure is scheduled on: Monday 06/23/2015 Report to Day Surgery. 2ND FLOOR MEDICAL MALL ENTRANCE To find out your arrival time please call [phone] between 1PM - 3PM on Friday 06/20/2015.  Remember: Instructions that are not followed completely may result in serious medical risk, up to and including death, or upon the discretion of your surgeon and anesthesiologist your surgery may need to be rescheduled.    __X__ 1. Do not eat food or drink liquids after midnight. No gum chewing or hard candies.     __X__ 2. No Alcohol for 24 hours before or after surgery.   ____ 3. Bring all medications with you on the day of surgery if instructed.    __X__ 4. Notify your doctor if there is any change in your medical condition     (cold, fever, infections).     Do not wear jewelry, make-up, hairpins, clips or nail polish.  Do not wear lotions, powders, or perfumes. You may wear deodorant.  Do not shave 48 hours prior to surgery. Men may shave face and neck.  Do not bring valuables to the hospital.    [facility] is not responsible for any belongings or valuables.               Contacts, dentures or bridgework may not be worn into surgery.  Leave your suitcase in the car. After surgery it may be brought to your room.  For patients admitted to the hospital, discharge time is determined by your                treatment team.   Patients discharged the day of surgery will not be allowed to drive home.   Please read over the following fact sheets that you were given:   Surgical Site Infection Prevention   __X__ Take these medicines the morning of surgery with A SIP OF WATER:    1. CARVEDILOL  2. OMEPRAZOLE NIGHT BEFORE AND MORNING OF SURGERY  3.   4.  5.  6.  ____ Fleet Enema (as directed)   ____ Use CHG Soap as directed  ____ Use inhalers on the day of surgery  ____ Stop metformin 2 days prior to surgery    ____ Take 1/2 of usual insulin dose the night before surgery and  none on the morning of surgery.   ____ Stop Coumadin/Plavix/aspirin on   ____ Stop Anti-inflammatories on    ____ Stop supplements until after surgery.    ____ Bring C-Pap to the hospital.

## 2015-06-23 ENCOUNTER — Ambulatory Visit
Admission: RE | Admit: 2015-06-23 | Discharge: 2015-06-23 | Disposition: A | Discharge status: Home / Self Care | Payer: Medicare Other | Source: Ambulatory Visit | Attending: Urology | Admitting: Urology

## 2015-06-23 ENCOUNTER — Ambulatory Visit: Payer: Medicare Other | Admitting: Anesthesiology

## 2015-06-23 ENCOUNTER — Encounter
Admission: RE | Disposition: A | Discharge status: Home / Self Care | Payer: Self-pay | Source: Ambulatory Visit | Attending: Urology

## 2015-06-23 DIAGNOSIS — E785 Hyperlipidemia, unspecified: Secondary | ICD-10-CM | POA: Diagnosis not present

## 2015-06-23 DIAGNOSIS — I129 Hypertensive chronic kidney disease with stage 1 through stage 4 chronic kidney disease, or unspecified chronic kidney disease: Secondary | ICD-10-CM | POA: Insufficient documentation

## 2015-06-23 DIAGNOSIS — Z8551 Personal history of malignant neoplasm of bladder: Secondary | ICD-10-CM | POA: Diagnosis not present

## 2015-06-23 DIAGNOSIS — Z79899 Other long term (current) drug therapy: Secondary | ICD-10-CM | POA: Insufficient documentation

## 2015-06-23 DIAGNOSIS — N3020 Other chronic cystitis without hematuria: Secondary | ICD-10-CM | POA: Insufficient documentation

## 2015-06-23 DIAGNOSIS — Z87442 Personal history of urinary calculi: Secondary | ICD-10-CM | POA: Diagnosis not present

## 2015-06-23 DIAGNOSIS — K219 Gastro-esophageal reflux disease without esophagitis: Secondary | ICD-10-CM | POA: Diagnosis not present

## 2015-06-23 DIAGNOSIS — C679 Malignant neoplasm of bladder, unspecified: Secondary | ICD-10-CM | POA: Insufficient documentation

## 2015-06-23 DIAGNOSIS — N400 Enlarged prostate without lower urinary tract symptoms: Secondary | ICD-10-CM | POA: Diagnosis not present

## 2015-06-23 DIAGNOSIS — N202 Calculus of kidney with calculus of ureter: Secondary | ICD-10-CM | POA: Diagnosis not present

## 2015-06-23 DIAGNOSIS — N189 Chronic kidney disease, unspecified: Secondary | ICD-10-CM | POA: Insufficient documentation

## 2015-06-23 DIAGNOSIS — N200 Calculus of kidney: Secondary | ICD-10-CM | POA: Diagnosis not present

## 2015-06-23 DIAGNOSIS — R1031 Right lower quadrant pain: Secondary | ICD-10-CM | POA: Insufficient documentation

## 2015-06-23 DIAGNOSIS — Z809 Family history of malignant neoplasm, unspecified: Secondary | ICD-10-CM | POA: Diagnosis not present

## 2015-06-23 DIAGNOSIS — Z87891 Personal history of nicotine dependence: Secondary | ICD-10-CM | POA: Insufficient documentation

## 2015-06-23 DIAGNOSIS — D494 Neoplasm of unspecified behavior of bladder: Secondary | ICD-10-CM | POA: Diagnosis not present

## 2015-06-23 DIAGNOSIS — Z8249 Family history of ischemic heart disease and other diseases of the circulatory system: Secondary | ICD-10-CM | POA: Insufficient documentation

## 2015-06-23 DIAGNOSIS — C671 Malignant neoplasm of dome of bladder: Secondary | ICD-10-CM

## 2015-06-23 HISTORY — PX: CYSTOSCOPY WITH STENT PLACEMENT: SHX5790

## 2015-06-23 HISTORY — PX: TRANSURETHRAL RESECTION OF BLADDER TUMOR: SHX2575

## 2015-06-23 HISTORY — PX: URETEROSCOPY WITH HOLMIUM LASER LITHOTRIPSY: SHX6645

## 2015-06-23 LAB — GLUCOSE, CAPILLARY
Glucose-Capillary: 98 mg/dL (ref 65–99)
Glucose-Capillary: 108 mg/dL — ABNORMAL HIGH (ref 65–99)

## 2015-06-23 SURGERY — URETEROSCOPY, WITH LITHOTRIPSY USING HOLMIUM LASER
Anesthesia: General | Laterality: Right

## 2015-06-23 MED ORDER — ONDANSETRON HCL 4 MG/2ML IJ SOLN: 4.0000 mg | Freq: Once | INTRAMUSCULAR | Status: DC | PRN

## 2015-06-23 MED ORDER — KETOROLAC TROMETHAMINE 30 MG/ML IJ SOLN
INTRAMUSCULAR | Status: DC | PRN
  Administered 2015-06-23: 30 mg via INTRAVENOUS

## 2015-06-23 MED ORDER — SODIUM CHLORIDE 0.9 % IV SOLN
INTRAVENOUS | Status: DC
  Administered 2015-06-23: 08:00:00 via INTRAVENOUS

## 2015-06-23 MED ORDER — FENTANYL CITRATE (PF) 100 MCG/2ML IJ SOLN
INTRAMUSCULAR | Status: AC
  Filled 2015-06-23: qty 2

## 2015-06-23 MED ORDER — HYDROCODONE-ACETAMINOPHEN 5-325 MG PO TABS: 1.0000 | ORAL_TABLET | Freq: Four times a day (QID) | ORAL | Status: DC | PRN

## 2015-06-23 MED ORDER — MITOMYCIN CHEMO FOR BLADDER INSTILLATION 40 MG
40.0000 mg | Freq: Once | INTRAVENOUS | Status: DC
  Filled 2015-06-23: qty 40

## 2015-06-23 MED ORDER — MITOMYCIN CHEMO FOR BLADDER INSTILLATION 40 MG
INTRAVENOUS | Status: DC | PRN
  Administered 2015-06-23: 40 mg via INTRAVESICAL

## 2015-06-23 MED ORDER — ONDANSETRON HCL 4 MG/2ML IJ SOLN
INTRAMUSCULAR | Status: DC | PRN
  Administered 2015-06-23: 4 mg via INTRAVENOUS

## 2015-06-23 MED ORDER — DEXTROSE 5 % IV SOLN: 80.0000 mg | Freq: Once | INTRAVENOUS | Status: DC

## 2015-06-23 MED ORDER — FENTANYL CITRATE (PF) 100 MCG/2ML IJ SOLN
INTRAMUSCULAR | Status: DC | PRN
  Administered 2015-06-23 (×2): 50 ug via INTRAVENOUS

## 2015-06-23 MED ORDER — GENTAMICIN IN SALINE 1.6-0.9 MG/ML-% IV SOLN
80.0000 mg | Freq: Once | INTRAVENOUS | Status: AC
  Administered 2015-06-23: 80 mg via INTRAVENOUS
  Filled 2015-06-23: qty 50

## 2015-06-23 MED ORDER — TAMSULOSIN HCL 0.4 MG PO CAPS: 0.4000 mg | ORAL_CAPSULE | Freq: Every day | ORAL | Status: DC

## 2015-06-23 MED ORDER — FENTANYL CITRATE (PF) 100 MCG/2ML IJ SOLN: 25.0000 ug | INTRAMUSCULAR | Status: DC | PRN

## 2015-06-23 MED ORDER — AMPICILLIN SODIUM 1 G IJ SOLR
1.0000 g | Freq: Once | INTRAMUSCULAR | Status: AC
  Administered 2015-06-23: 1 g via INTRAVENOUS
  Filled 2015-06-23: qty 1000

## 2015-06-23 MED ORDER — FENTANYL CITRATE (PF) 100 MCG/2ML IJ SOLN
25.0000 ug | INTRAMUSCULAR | Status: DC | PRN
  Administered 2015-06-23 (×2): 25 ug via INTRAVENOUS

## 2015-06-23 MED ORDER — OXYBUTYNIN CHLORIDE 5 MG PO TABS: 5.0000 mg | ORAL_TABLET | Freq: Three times a day (TID) | ORAL | Status: DC | PRN

## 2015-06-23 MED ORDER — DEXAMETHASONE SODIUM PHOSPHATE 10 MG/ML IJ SOLN
INTRAMUSCULAR | Status: DC | PRN
  Administered 2015-06-23: 10 mg via INTRAVENOUS

## 2015-06-23 SURGICAL SUPPLY — 47 items
ADAPTER SCOPE UROLOK II (MISCELLANEOUS) ×4 IMPLANT
BAG DRAIN CYSTO-URO LG1000N (MISCELLANEOUS) ×4 IMPLANT
BAG URO DRAIN 2000ML W/SPOUT (MISCELLANEOUS) ×4 IMPLANT
BASKET ZERO TIP 1.9FR (BASKET) ×8 IMPLANT
CATH FOLEY 2WAY  5CC 16FR (CATHETERS) ×2
CATH URETL 5X70 OPEN END (CATHETERS) ×4 IMPLANT
CATH URTH 16FR FL 2W BLN LF (CATHETERS) ×2 IMPLANT
CNTNR SPEC 2.5X3XGRAD LEK (MISCELLANEOUS) ×2
CONRAY 43 FOR UROLOGY 50M (MISCELLANEOUS) ×4 IMPLANT
CONT SPEC 4OZ STER OR WHT (MISCELLANEOUS) ×2
CONTAINER SPEC 2.5X3XGRAD LEK (MISCELLANEOUS) ×2 IMPLANT
CORD URO TURP 10FT (MISCELLANEOUS) ×4 IMPLANT
ELECT LOOP MED HF 24F 12D (CUTTING LOOP) ×4 IMPLANT
ELECT URO BUGBEE 5F (MISCELLANEOUS) ×4
ELECTRODE URO BUGBEE 5F (MISCELLANEOUS) ×2 IMPLANT
FEE TECHNICIAN ONLY PER HOUR (MISCELLANEOUS) IMPLANT
GLOVE BIO SURGEON STRL SZ 6.5 (GLOVE) ×3 IMPLANT
GLOVE BIO SURGEON STRL SZ7 (GLOVE) ×8 IMPLANT
GLOVE BIO SURGEONS STRL SZ 6.5 (GLOVE) ×1
GOWN STRL REUS W/ TWL LRG LVL3 (GOWN DISPOSABLE) ×4 IMPLANT
GOWN STRL REUS W/ TWL LRG LVL4 (GOWN DISPOSABLE) ×4 IMPLANT
GOWN STRL REUS W/TWL LRG LVL3 (GOWN DISPOSABLE) ×4
GOWN STRL REUS W/TWL LRG LVL4 (GOWN DISPOSABLE) ×4
INTRODUCER DILATOR DOUBLE (INTRODUCER) ×4 IMPLANT
KIT RM TURNOVER CYSTO AR (KITS) ×4 IMPLANT
LASER HOLMIUM 3 IN 1 DAY (MISCELLANEOUS) ×4 IMPLANT
LASER HOLMIUM SU 200UM (MISCELLANEOUS) ×4 IMPLANT
LOOP CUT RT ANGL 28F (MISCELLANEOUS) ×4 IMPLANT
PACK CYSTO AR (MISCELLANEOUS) ×4 IMPLANT
PAD GROUND ADULT SPLIT (MISCELLANEOUS) ×4 IMPLANT
PREP PVP WINGED SPONGE (MISCELLANEOUS) ×4 IMPLANT
PUMP SINGLE ACTION SAP (PUMP) ×4 IMPLANT
ROLLER BALL 3MM 24FR (ELECTROSURGICAL) ×4 IMPLANT
SENSORWIRE 0.038 NOT ANGLED (WIRE) ×8
SET CYSTO W/LG BORE CLAMP LF (SET/KITS/TRAYS/PACK) ×4 IMPLANT
SET IRRIG Y TYPE TUR BLADDER L (SET/KITS/TRAYS/PACK) ×4 IMPLANT
SHEATH URETERAL 12FRX35CM (MISCELLANEOUS) ×4 IMPLANT
SOL .9 NS 3000ML IRR  AL (IV SOLUTION) ×2
SOL .9 NS 3000ML IRR UROMATIC (IV SOLUTION) ×2 IMPLANT
STENT CONTOUR 7X26 (Stent) ×4 IMPLANT
STENT URET 6FRX24 CONTOUR (STENTS) IMPLANT
STENT URET 6FRX26 CONTOUR (STENTS) ×4 IMPLANT
SURGILUBE 2OZ TUBE FLIPTOP (MISCELLANEOUS) ×4 IMPLANT
SYRINGE IRR TOOMEY STRL 70CC (SYRINGE) ×4 IMPLANT
WATER STERILE IRR 1000ML POUR (IV SOLUTION) ×4 IMPLANT
WATER STERILE IRR 3000ML UROMA (IV SOLUTION) ×8 IMPLANT
WIRE SENSOR 0.038 NOT ANGLED (WIRE) ×4 IMPLANT

## 2015-06-23 NOTE — Anesthesia Preprocedure Evaluation (Addendum)
Anesthesia Evaluation  Patient identified by MRN, date of birth, ID band Patient awake    Reviewed: Allergy & Precautions, NPO status , Patient's Chart, lab work & pertinent test results, reviewed documented beta blocker date and time   Airway Mallampati: II  TM Distance: >3 FB     Dental  (+) Chipped   Pulmonary former smoker,           Cardiovascular hypertension, Pt. on medications and Pt. on home beta blockers      Neuro/Psych    GI/Hepatic GERD  Controlled and Medicated,  Endo/Other  diabetes, Type 2  Renal/GU Renal disease     Musculoskeletal   Abdominal   Peds  Hematology   Anesthesia Other Findings EKG ok.  Reproductive/Obstetrics                            Anesthesia Physical Anesthesia Plan  ASA: III  Anesthesia Plan: General   Post-op Pain Management:    Induction: Intravenous  Airway Management Planned: Oral ETT  Additional Equipment:   Intra-op Plan:   Post-operative Plan:   Informed Consent: I have reviewed the patients History and Physical, chart, labs and discussed the procedure including the risks, benefits and alternatives for the proposed anesthesia with the patient or authorized representative who has indicated his/her understanding and acceptance.     Plan Discussed with: CRNA  Anesthesia Plan Comments:         Anesthesia Quick Evaluation

## 2015-06-23 NOTE — Transfer of Care (Signed)
Immediate Anesthesia Transfer of Care Note  Patient: Cory Hardin  Procedure(s) Performed: Procedure(s): URETEROSCOPY WITH HOLMIUM LASER LITHOTRIPSY (Right) CYSTOSCOPY WITH STENT PLACEMENT (Right) TRANSURETHRAL RESECTION OF BLADDER TUMOR (TURBT) (N/A)  Patient Location: PACU  Anesthesia Type:General  Level of Consciousness: sedated and responds to stimulation  Airway & Oxygen Therapy: Patient Spontanous Breathing and Patient connected to face mask oxygen  Post-op Assessment: Report given to RN and Post -op Vital signs reviewed and stable  Post vital signs: Reviewed and stable  Last Vitals:  Filed Vitals:   06/23/15 0909 06/23/15 1126  BP: 161/86 150/93  Pulse: 56 66  Temp:  36.5 C  Resp:  11    Complications: No apparent anesthesia complications

## 2015-06-23 NOTE — Brief Op Note (Signed)
06/23/2015  11:48 AM  PATIENT:  Cory Hardin  69 y.o. male  PRE-OPERATIVE DIAGNOSIS:  RIGHT DISTAL URETERAL STONE, BLADDER LESION  POST-OPERATIVE DIAGNOSIS:  RIGHT DISTAL URETERAL STONE, BLADDER LESION  PROCEDURE:  Procedure(s): URETEROSCOPY WITH HOLMIUM LASER LITHOTRIPSY (Right) CYSTOSCOPY WITH STENT PLACEMENT (Right) TRANSURETHRAL RESECTION OF BLADDER TUMOR (TURBT) (N/A)  SURGEON:  Surgeon(s) and Role:    * [name], MD - Primary  ASSISTANTS: none   ANESTHESIA:   general  EBL:  Total I/O In: 800 [I.V.:800] Out: -   Drains: 19 Fr Foley and 6x26 Fr JJ ureteral stent on right  Specimen: dome bladder biopsy, right trigone bilater biopsy  COUNTS CORRECT: YES  PLAN OF CARE: Discharge to home after PACU  PATIENT DISPOSITION:  PACU - hemodynamically stable.

## 2015-06-23 NOTE — H&P (View-Only) (Signed)
8:72 AM   Cory Hardin 07-04-1946 [id number]  Referring provider: Albina Billet, MD [address]   [place],  [address]  Chief Complaint  Patient presents with  . Cysto    HPI: 69 yo recurrent nephrolithiasis, bladder stones, recurrent bladder CA, BPH s/p TURP who presents today his first surviellence cysto 3 months after completing induction BCG x 6.    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 He has a recurrence and undenwent TURBT in 08/2014, pathology LgTA.  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  S/p BCG x 6 completed 02/2015.  He has an extensive history of recurrent stones. He is undergone multiple stone procedures in the past. 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  Most recent KUB on 05/28/15 shows a new left lower pole renal calculus along with a 13 mm calcification in the possible right distal ureter.  He has been complaining of some right lower quadrant pain for the past 3 months which comes and goes.  He has no urinary complains today.  No dysuria, gross hematuria, fevers or chills. He has an excellent stream which is improved since his TURP.     PMH: Past Medical History  Diagnosis Date  . Cancer ([place])     bladder  . BPH (benign prostatic hyperplasia)   . Hypertension   . Chronic kidney disease     stones,renal failure  . Hyperlipidemia   . Hematuria   . Bilateral kidney stones   . 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 Transurethral resection of bladder tumor with gyrus (turbt-gyrus)    . Cystoscopy    . Bladder stones    . Percutaneous nephrolithotripsy    . Appendectomy    . Cystoscopy with biopsy N/A 11/20/2014    Procedure: CYSTOSCOPY WITH BIOPSY;  Surgeon: [name], MD;  Location: [facility]  ORS;  Service: Urology;  Laterality: N/A;    Home Medications:    Medication List       This list is accurate as of: 06/09/15  8:27 AM.  Always use your most recent med list.               carvedilol 12.5 MG tablet  Commonly known as:  COREG  Take 12.5 mg by mouth 2 (two) times daily with a meal.     Choline Fenofibrate 135 MG capsule  Take 135 mg by mouth daily. am     glimepiride 1 MG tablet  Commonly known as:  AMARYL     hydrochlorothiazide 25 MG tablet  Commonly known as:  HYDRODIURIL  Take 1 tablet by mouth daily.     omeprazole 20 MG capsule  Commonly known as:  PRILOSEC     pravastatin 40 MG tablet  Commonly known as:  PRAVACHOL  Take 40 mg by mouth daily. pm        Allergies: No Known Allergies  Family History: Family History  Problem Relation Age of Onset  . Heart disease Father   . Heart disease Mother   . Cancer Maternal Aunt   . Heart disease Brother   . Kidney disease Neg Hx   . Prostate cancer Neg Hx     Social History:  reports that he quit smoking about 32 years  ago. His smoking use included Cigarettes. He has a 3.75 pack-year smoking history. He has never used smokeless tobacco. He reports that he drinks about 0.6 oz of alcohol per week. He reports that he does not use illicit drugs.  Physical Exam: BP 204/101 mmHg  Pulse 69  Ht 6' (1.829 m)  Wt 193 lb 4.8 oz (87.68 kg)  BMI 26.21 kg/m2  Constitutional:  Alert and oriented, No acute distress. HEENT: [place] AT, moist mucus membranes.  Trachea midline, no masses. Cardiovascular: No clubbing, cyanosis, or edema.  RRR. Respiratory: Normal respiratory effort, no increased work of breathing.  CTAB. GI: Abdomen is soft, nontender, nondistended, no abdominal masses GU: No CVA tenderness.  Normal circumcised phallus. Skin: No rashes, bruises or suspicious lesions. Neurologic: Grossly intact, no focal deficits, moving all 4 extremities. Psychiatric: Normal mood and affect.  Laboratory Data: Lab  Results  Component Value Date   WBC 6.0 08/01/2014   HGB 13.6 08/01/2014   HCT 41.0 08/01/2014   MCV 90 08/01/2014   PLT 209 08/01/2014    Lab Results  Component Value Date   CREATININE 1.51* 11/13/2014     Urinalysis UA today with many RBC/ WBC, however, nitrite negative.  UCx last week NEGATIVE.     Study Result     CLINICAL DATA: History of kidney stones and bladder cancer, previous history of lithotripsy  EXAM: ABDOMEN - 1 VIEW  COMPARISON: KUB of 07/23/2014  FINDINGS: The larger calculus in the lower pole of the right collecting system has undergone lithotripsy, with only small right renal calculi present. However, there is an oval calcification overlying the low right pelvis of 13 mm in diameter which was not present previously and could represent a distal right ureteral calculus. Additional stone fragments are noted presumably post lithotripsy most likely within the urinary bladder. There are left lower pole renal calculi now noted. The bowel gas pattern is nonspecific.  IMPRESSION: 1. Post lithotripsy there are calcifications probably within urinary bladder consistent with fragmented and passed calculi. 2. In addition there is a 13 mm calcification in the region of the expected distal right ureter which may represent a calculus as well. 3. Bilateral renal calculi.   Electronically Signed  By: Ivar Drape M.D.  On: 05/28/2015 08:59       Cystoscopy Procedure Note  Patient identification was confirmed, informed consent was obtained, and patient was prepped using Betadine solution.  Lidocaine jelly was administered per urethral meatus.    Preoperative abx where received prior to procedure.     Pre-Procedure: - Inspection reveals a normal caliber ureteral meatus.  Procedure: The flexible cystoscope was introduced without difficulty - No urethral strictures/lesions are present. - Normal prostate open prostatic urethra with TURP  defect, relatively short length - Normal bladder neck - Bilateral ureteral orifices identified - Bladder mucosa  reveals flat somewhat erythematous lesion on the posterior right wall of bladder with overlying calcification adherent to the mucosa, somewhat suspicious for underlying tumor.  No other obvious lesions. - No loose bladder stones - No trabeculation  Retroflexion unremarkable.  Post-Procedure: - Patient tolerated the procedure well    Assessment & Plan:    1. 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  He completed 6 week course of BCG  completed  02/2015.  Cystoscopy today suspicious for flat erythematous  lesion with overlying calcification. Recommend proceeding with biopsy given his history of recurrent bladder cancer/CIS. -Plan for plan for small TURBT/ possible mitomycin -preop urine culture  2. Calcium nephrolithiasis Status post multiple procedures including ureteroscopy 2, ESWL, and remote history of PCNL.  KUB today shows a 13 mm right distal stone from which he is symptomatic. I have recommended proceeding with right ureteroscopy laser lithotripsy and stent placement. He's had this procedure before and all the questions were answered. I stressed the importance of more urgent intervention should he develop any significant urinary symptoms, fevers, chills, malaise, or any other worrisome changes given the presence of his presumably obstructing stone.  We will discuss treatment of his new left lower pole stones after his obstructing stone is treated. -HCTZ 25mg  continue -schedule right URS, LL, stent   3. BPH (benign prostatic hyperplasia) s/p TURP on 04/17/14. Pathology negative. Voiding well   [facility] [address] [place], [place] [address] [phone]

## 2015-06-23 NOTE — Op Note (Signed)
Date of procedure: 06/23/2015  Preoperative diagnosis:  1. History of bladder cancer 2. Bladder lesion 3. 13 mm right distal ureteral stone 4. Recurrent nephrolithiasis 5. Hypercalciuria   Postoperative diagnosis:  1. Same as above   Procedure: 1. Cystoscopy 2. TURBT (small) 3. Right ureteroscopy 4. Laser lithotripsy 5. Basket extraction of right ureteral stones 6. Right ureteral stent placement 7. Distillation of intravesical mitomycin/chemotherapy  Surgeon: [name], MD  Anesthesia: General  Complications: None  Intraoperative findings: Small approximately 1 cm bladder lesion on dome of bladder with adherent calcified material suspicious for bladder tumor recurrence. Erythema just beyond right trigone also biopsied. 13 mm right distal ureteral stone treated.  EBL: Minimal  Specimens: Bladder lesion, dome. Right trigone bladder biopsy.  Drains: 6 x 26 French double-J ureteral stent on right  Indication: Cory Hardin is a 69 y.o. patient with a history of recurrent bladder cancer, recurrent nephrolithiasis he was found to have a 13 mm right distal ureteral stone on KUB for routine stone follow-up. He had been having some right flank pain. He was also noted to have a suspicious-appearing bladder lesion on the dome of the bladder was adherent calcification in the office on routine surveillance cystoscopy..  After reviewing the management options for treatment, he elected to proceed with the above surgical procedure(s). We have discussed the potential benefits and risks of the procedure, side effects of the proposed treatment, the likelihood of the patient achieving the goals of the procedure, and any potential problems that might occur during the procedure or recuperation. Informed consent has been obtained.  Description of procedure:  The patient was taken to the operating room and general anesthesia was induced.  The patient was placed in the dorsal lithotomy position,  prepped and draped in the usual sterile fashion, and preoperative antibiotics were administered. A preoperative time-out was performed.   A 21 French rigid cystoscope was advanced per urethra into the bladder. Attention was turned to the right ureteral orifice which was cannulated using a sensor wire up to level of the kidney. The stone was visible on fluoroscopy and the wire was able to bypass the stone without any difficulty. The wire was snapped in place. A rigid ureteroscope was then used to access the distal ureter which is able to be advanced to the stone easily. A 200  laser fiber was then brought in and used to fragment the stone into the multiple smaller pieces using settings of 0.8 J and 10 Hz. Each of these pieces were then individually basketed out the distal ureter using a 1.9 [place] to plus nitinol basket. Once the distal ureter was completely clear of stone, the scope was passed up to the level of the iliacs and there was no other residual stone fragments noted. There was some fairly significant distal ureteral edema noted but no ureteral trauma or injury. The scope was then removed. Formal cystoscopy was then performed at which time the lesion that had been seen in the office surveillance cystoscopy was noted near the right dome/posterior wall of the bladder. This was an approximately 1 cm area of erythema with overlying adherent calcification which was highly suspicious for recurrent tumor. Cold cup biopsy forceps were used to remove both the calcification along with the underlying tissue. The base of this lesion was then fulgurated extensively using electrocautery. The remainder of the bladder was also carefully inspected and there was some area of erythema just beyond the right UO which is mildly suspicious. I did go ahead  and biopsy this area as well and fulgurated the base. These were sent off as 2 separate lesions. At this point in time adequate hemostasis was achieved.   The sensor wire  was backloaded over a rigid cystoscope and a 6 x 26 French double-J ureteral stent was advanced up to level of the right renal pelvis under fluoroscopic guidance. The wire was partially withdrawn until coil was noted within the renal pelvis. Wire was then fully withdrawn and a coil was noted within the bladder. The bladder was drained. No string was left on the stent given the fairly significant ureteral edema noted at the end of the case. The bladder was then drained. A 16 French Foley catheter was advanced into his bladder and the balloon was inflated with 10 cc of sterile water. 40 mg of intravesical mitomycin was then instilled into the bladder and clamped off. This was maintained in the bladder for 1 hour and eventually drained and the PACU which on the Foley was removed. This was deemed to the end of the case and the patient was repositioned supine position, reversed from anesthesia, taken to the PACU in stable condition.  Plan: Patient will return to the office in 2 weeks for cystoscopy, stent removal.  [name], M.D.

## 2015-06-23 NOTE — Discharge Instructions (Signed)

## 2015-06-23 NOTE — Anesthesia Procedure Notes (Signed)
Procedure Name: LMA Insertion Performed by: Jonna Clark Pre-anesthesia Checklist: Patient identified, Patient being monitored, Timeout performed, Emergency Drugs available and Suction available Patient Re-evaluated:Patient Re-evaluated prior to inductionOxygen Delivery Method: Circle system utilized Preoxygenation: Pre-oxygenation with 100% oxygen Intubation Type: IV induction Ventilation: Mask ventilation without difficulty LMA: LMA inserted LMA Size: 4.0 Tube type: Oral Number of attempts: 1 Placement Confirmation: positive ETCO2 and breath sounds checked- equal and bilateral Tube secured with: Tape Dental Injury: Teeth and Oropharynx as per pre-operative assessment

## 2015-06-23 NOTE — Anesthesia Postprocedure Evaluation (Signed)
Anesthesia Post Note  Patient: Cory Hardin  Procedure(s) Performed: Procedure(s) (LRB): URETEROSCOPY WITH HOLMIUM LASER LITHOTRIPSY (Right) CYSTOSCOPY WITH STENT PLACEMENT (Right) TRANSURETHRAL RESECTION OF BLADDER TUMOR (TURBT) (N/A)  Patient location during evaluation: Other Anesthesia Type: General Pain management: pain level controlled Vital Signs Assessment: post-procedure vital signs reviewed and stable Respiratory status: spontaneous breathing Cardiovascular status: blood pressure returned to baseline Anesthetic complications: no    Last Vitals:  Filed Vitals:   06/23/15 1256 06/23/15 1311  BP: 131/79 150/84  Pulse: 64 68  Temp: 36.7 C 35.6 C  Resp:  14    Last Pain:  Filed Vitals:   06/23/15 1312  PainSc: 0-No pain                 Calley Drenning S

## 2015-06-23 NOTE — Interval H&P Note (Signed)
History and Physical Interval Note:  06/23/2015 8:43 AM  Cory Hardin  has presented today for surgery, with the diagnosis of RIGHT DISTAL URETERAL STONE, BLADDER LESION  The various methods of treatment have been discussed with the patient and family. After consideration of risks, benefits and other options for treatment, the patient has consented to  Procedure(s): URETEROSCOPY WITH HOLMIUM LASER LITHOTRIPSY (Right) CYSTOSCOPY WITH STENT PLACEMENT (Right) TRANSURETHRAL RESECTION OF BLADDER TUMOR (TURBT) (N/A) as a surgical intervention .  The patient's history has been reviewed, patient examined, no change in status, stable for surgery.  I have reviewed the patient's chart and labs.  Questions were answered to the patient's satisfaction.     [name]

## 2015-06-24 LAB — SURGICAL PATHOLOGY

## 2015-06-26 ENCOUNTER — Telehealth: Payer: Self-pay

## 2015-06-26 NOTE — Telephone Encounter (Signed)
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  Follow up for stent removal.     [name], MD

## 2015-07-01 NOTE — Telephone Encounter (Signed)
Done ° ° °michelle °

## 2015-07-08 ENCOUNTER — Ambulatory Visit (INDEPENDENT_AMBULATORY_CARE_PROVIDER_SITE_OTHER): Payer: Medicare Other | Admitting: Urology

## 2015-07-08 VITALS — BP 196/98 | HR 64 | Ht 72.0 in | Wt 190.9 lb

## 2015-07-08 DIAGNOSIS — N200 Calculus of kidney: Secondary | ICD-10-CM

## 2015-07-08 DIAGNOSIS — N400 Enlarged prostate without lower urinary tract symptoms: Secondary | ICD-10-CM

## 2015-07-08 DIAGNOSIS — C672 Malignant neoplasm of lateral wall of bladder: Secondary | ICD-10-CM

## 2015-07-08 LAB — URINALYSIS, COMPLETE
Bilirubin, UA: NEGATIVE
Glucose, UA: NEGATIVE
Ketones, UA: NEGATIVE
Nitrite, UA: NEGATIVE
Specific Gravity, UA: >1.03 — ABNORMAL HIGH (ref 1.005–1.030)
Urobilinogen, Ur: 1 mg/dL (ref 0.2–1.0)
pH, UA: 5.5 (ref 5.0–7.5)

## 2015-07-08 LAB — MICROSCOPIC EXAMINATION: RBC, UA: >30 /hpf — ABNORMAL HIGH (ref 0–?)

## 2015-07-08 MED ORDER — LIDOCAINE HCL 2 % EX GEL
1.0000 "application " | Freq: Once | CUTANEOUS | Status: AC
  Administered 2015-07-08: 1 via URETHRAL

## 2015-07-08 MED ORDER — CIPROFLOXACIN HCL 500 MG PO TABS
500.0000 mg | ORAL_TABLET | Freq: Once | ORAL | Status: AC
  Administered 2015-07-08: 500 mg via ORAL

## 2015-07-08 NOTE — Progress Notes (Signed)
3:57 PM  07/08/2015   Cory Hardin 1945/12/10 [id number]  Referring provider: Albina Billet, MD [address]   [place], [place] [address]  Chief Complaint  Patient presents with  . Cysto Stent Removal    HPI: 70 yo M w/ recurrent nephrolithiasis, bladder stones, recurrent bladder CA, BPH s/p TURP  returns to the office today for cystoscopy, right ureteral stent removal. He was most recently taken to the OR on  06/23/2015 for repeat TURBT of an area suspicious for recurrence (pathology benign ) as well as ureteroscopy and treatment of a right distal ureteral stone.   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 He has a recurrence and undenwent TURBT in 08/2014, pathology LgTA.  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  S/p BCG x 6 completed 02/2015.  Cysto 06/2015 suspicious for recurrent, TURBT c/w granulomatous inflammation/ chronic cystitis.    Nephrolithiasis: He has an extensive history of recurrent stones. He is undergone multiple stone procedures in the past. 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  Most recent KUB on 05/28/15 shows a new left lower pole renal calculus along with a 13 mm calcification in the possible right distal ureter s/p R URS, LL on 06/23/15, here for stent removal.  BPH: History of BPH, bladder stones S/p TURP 04/2014.  Voiding well.   PMH: Past Medical History  Diagnosis Date  . Cancer ([place])     bladder  . BPH (benign prostatic hyperplasia)   . Hypertension   . Chronic kidney disease     stones,renal failure  . Hyperlipidemia   . Hematuria   . Bilateral kidney stones   . 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 Diabetes mellitus without complication ([place])   . GERD (gastroesophageal reflux disease)     Surgical History: Past Surgical History    Procedure Laterality Date  . Transurethral resection of bladder tumor with gyrus (turbt-gyrus)    . Cystoscopy    . Bladder stones    . Percutaneous nephrolithotripsy    . Appendectomy    . Cystoscopy with biopsy N/A 11/20/2014    Procedure: CYSTOSCOPY WITH BIOPSY;  Surgeon: [name], MD;  Location: [facility] OR[facility];  Service: Urology;  Laterality: N/A;  . Ureteroscopy with holmium laser lithotripsy Right 06/23/2015    Procedure: URETEROSCOPY WITH HOLMIUM LASER LITHOTRIPSY;  Surgeon: [name], MD;  Location: [facility];  Service: Urology;  Laterality: Right;  . Cystoscopy with stent placement Right 06/23/2015    Procedure: CYSTOSCOPY WITH STENT PLACEMENT;  Surgeon: [name], MD;  Location: [facility];  Service: Urology;  Laterality: Right;  . Transurethral resection of bladder tumor N/A 06/23/2015    Procedure: TRANSURETHRAL RESECTION OF BLADDER TUMOR (TURBT);  Surgeon: [name], MD;  Location: [facility];  Service: Urology;  Laterality: N/A;    Home Medications:    Medication List       This list is accurate as of: 07/08/15  3:57 PM.  Always use your most recent med list.               carvedilol 12.5 MG tablet  Commonly known as:  COREG  Take 12.5 mg by mouth 2 (two) times daily with a meal.     Choline Fenofibrate 135 MG capsule  Take 135 mg by mouth daily. am  glimepiride 1 MG tablet  Commonly known as:  AMARYL  Take 1 mg by mouth daily.     hydrochlorothiazide 25 MG tablet  Commonly known as:  HYDRODIURIL  Take 1 tablet by mouth daily.     HYDROcodone-acetaminophen 5-325 MG tablet  Commonly known as:  NORCO/VICODIN  Take 1-2 tablets by mouth every 6 (six) hours as needed for moderate pain.     omeprazole 20 MG capsule  Commonly known as:  PRILOSEC  Take 20 mg by mouth daily.     oxybutynin 5 MG tablet  Commonly known as:  DITROPAN  Take 1 tablet (5 mg total) by mouth every 8 (eight) hours as needed for bladder spasms.     pravastatin 40 MG tablet   Commonly known as:  PRAVACHOL  Take 40 mg by mouth daily. pm     tamsulosin 0.4 MG Caps capsule  Commonly known as:  FLOMAX  Take 1 capsule (0.4 mg total) by mouth daily.        Allergies: No Known Allergies  Family History: Family History  Problem Relation Age of Onset  . Heart disease Father   . Heart disease Mother   . Cancer Maternal Aunt   . Heart disease Brother   . Kidney disease Neg Hx   . Prostate cancer Neg Hx     Social History:  reports that he quit smoking about 32 years ago. His smoking use included Cigarettes. He has a 3.75 pack-year smoking history. He has never used smokeless tobacco. He reports that he drinks about 0.6 oz of alcohol per week. He reports that he does not use illicit drugs.  Physical Exam: BP 196/98 mmHg  Pulse 64  Ht 6' (1.829 m)  Wt 190 lb 14.4 oz (86.592 kg)  BMI 25.89 kg/m2  Constitutional:  Alert and oriented, No acute distress. HEENT: [place] AT, moist mucus membranes.  Trachea midline, no masses. Cardiovascular: No clubbing, cyanosis, or edema.  RRR. Respiratory: Normal respiratory effort, no increased work of breathing.  CTAB. GI: Abdomen is soft, nontender, nondistended, no abdominal masses GU: No CVA tenderness.  Normal circumcised phallus. Skin: No rashes, bruises or suspicious lesions. Neurologic: Grossly intact, no focal deficits, moving all 4 extremities. Psychiatric: Normal mood and affect.  Laboratory Data: Lab Results  Component Value Date   WBC 5.4 06/19/2015   HGB 14.3 06/19/2015   HCT 42.2 06/19/2015   MCV 91.0 06/19/2015   PLT 139* 06/19/2015    Lab Results  Component Value Date   CREATININE 1.45* 06/19/2015     Urinalysis Results for orders placed or performed during the hospital encounter of 06/23/15  Glucose, capillary  Result Value Ref Range   Glucose-Capillary 98 65 - 99 mg/dL  Glucose, capillary  Result Value Ref Range   Glucose-Capillary 108 (H) 65 - 99 mg/dL  Surgical pathology  Result Value  Ref Range   SURGICAL PATHOLOGY      Surgical Pathology CASE: [id number] PATIENT: Cory Hardin Surgical Pathology Report     SPECIMEN SUBMITTED: A. Bladder tumor, right trigone, biopsy B. Bladder tumor, dome, biopsy  CLINICAL HISTORY: None provided  PRE-OPERATIVE DIAGNOSIS: Right distal ureteral stone, bladder lesion  POST-OPERATIVE DIAGNOSIS: Same as pre-op     DIAGNOSIS: A. BLADDER, RIGHT TRIGONE; BIOPSY: - GRANULOMATOUS INFLAMMATION AND CHRONIC CYSTITIS. - NEGATIVE FOR ATYPIA AND MALIGNANCY.  B. BLADDER, DOME; BIOPSY: - PARTLY DENUDED MUCOSA WITH GRANULOMATOUS INFLAMMATION. - CALCULI. - NEGATIVE FOR ATYPIA AND MALIGNANCY.  Comment: There are scattered granulomas consistent with BCG  therapy. The previous history is noted.   GROSS DESCRIPTION: A. Labeled: bladder tumor right trigone Tissue fragment(s): 3 Size: less than 0.1-0.1 cm Description: Tan, marked orange material  Entirely submitted in one cassette(s).   B. Labeled: bladder tumor dome Tissue fragment(s): 3 Size:  0.1-0.3 cm Description: pink-tan  Entirely submitted in one cassette(s).  Final Diagnosis performed by Bryan Lemma, MD.  Electronically signed 06/24/2015 5:17:12PM    The electronic signature indicates that the named Attending Pathologist has evaluated the specimen  Technical component performed at [facility], [address] [place], [place] [address] Lab: [phone] Dir: Darrick Penna. Evette Doffing, MD  Professional component performed at [facility], [facility], [place], [place], [place] [address] Lab: [phone] Dir: Dellia Nims. Rubinas, MD          Study Result     CLINICAL DATA: History of kidney stones and bladder cancer, previous history of lithotripsy  EXAM: ABDOMEN - 1 VIEW  COMPARISON: KUB of 07/23/2014  FINDINGS: The larger calculus in the lower pole of the right collecting system has undergone lithotripsy, with only small right renal  calculi present. However, there is an oval calcification overlying the low right pelvis of 13 mm in diameter which was not present previously and could represent a distal right ureteral calculus. Additional stone fragments are noted presumably post lithotripsy most likely within the urinary bladder. There are left lower pole renal calculi now noted. The bowel gas pattern is nonspecific.  IMPRESSION: 1. Post lithotripsy there are calcifications probably within urinary bladder consistent with fragmented and passed calculi. 2. In addition there is a 13 mm calcification in the region of the expected distal right ureter which may represent a calculus as well. 3. Bilateral renal calculi.   Electronically Signed  By: Ivar Drape M.D.  On: 05/28/2015 08:59    Cystoscopy/ Stent removal procedure  Patient identification was confirmed, informed consent was obtained, and patient was prepped using Betadine solution.  Lidocaine jelly was administered per urethral meatus.    Preoperative abx where received prior to procedure.    Procedure: - Flexible cystoscope introduced, without any difficulty.   - Thorough search of the bladder revealed:    normal urethral meatus  Stent seen emanating from right ureteral orifice, grasped with stent graspers, and removed in entirety.     Post-Procedure: - Patient tolerated the procedure well   Assessment & Plan:    1. 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  He completed 6 week course of BCG  completed  02/2015.  Repeat TUR 06/2015 negative for malignancy.  2. Calcium nephrolithiasis Status post multiple procedures including ureteroscopy 2, ESWL, and remote history of PCNL and most recent right URS, LL, stent on 06/2015.  Stent removed  today without difficulty. -HCTZ 25mg  continue, consider referral to [facility] (Dr. Fabio Asa) for further evaluation of active metabolic stone formation -schedule right URS, LL, stent   3. BPH (benign prostatic hyperplasia) s/p TURP on 04/17/14. Pathology negative. Voiding well Due for PSA/ DRE, will get a next visit  F/u in 3 months for cystoscopy, RUS in 6 months   [facility] [address] [place], [place] [address] [phone]

## 2015-08-13 ENCOUNTER — Other Ambulatory Visit: Payer: Self-pay

## 2015-08-13 DIAGNOSIS — N200 Calculus of kidney: Secondary | ICD-10-CM

## 2015-08-13 MED ORDER — HYDROCHLOROTHIAZIDE 25 MG PO TABS: 25.0000 mg | ORAL_TABLET | Freq: Every day | ORAL | Status: DC

## 2015-09-29 ENCOUNTER — Ambulatory Visit
Admission: RE | Admit: 2015-09-29 | Discharge: 2015-09-29 | Disposition: A | Discharge status: Home / Self Care | Payer: Medicare Other | Source: Ambulatory Visit | Attending: Urology | Admitting: Urology

## 2015-09-29 DIAGNOSIS — N200 Calculus of kidney: Secondary | ICD-10-CM | POA: Diagnosis not present

## 2015-10-10 ENCOUNTER — Encounter: Payer: Self-pay | Admitting: Urology

## 2015-10-10 ENCOUNTER — Ambulatory Visit (INDEPENDENT_AMBULATORY_CARE_PROVIDER_SITE_OTHER): Payer: Medicare Other | Admitting: Urology

## 2015-10-10 VITALS — BP 173/94 | HR 57 | Ht 72.0 in | Wt 200.6 lb

## 2015-10-10 DIAGNOSIS — Z8551 Personal history of malignant neoplasm of bladder: Secondary | ICD-10-CM | POA: Diagnosis not present

## 2015-10-10 DIAGNOSIS — N400 Enlarged prostate without lower urinary tract symptoms: Secondary | ICD-10-CM | POA: Diagnosis not present

## 2015-10-10 DIAGNOSIS — N200 Calculus of kidney: Secondary | ICD-10-CM

## 2015-10-10 LAB — MICROSCOPIC EXAMINATION: Bacteria, UA: NONE SEEN

## 2015-10-10 LAB — URINALYSIS, COMPLETE
Bilirubin, UA: NEGATIVE
Glucose, UA: NEGATIVE
Ketones, UA: NEGATIVE
Leukocytes, UA: NEGATIVE
Nitrite, UA: NEGATIVE
Protein, UA: NEGATIVE
Specific Gravity, UA: 1.025 (ref 1.005–1.030)
Urobilinogen, Ur: 0.2 mg/dL (ref 0.2–1.0)
pH, UA: 5.5 (ref 5.0–7.5)

## 2015-10-10 MED ORDER — CIPROFLOXACIN HCL 500 MG PO TABS
500.0000 mg | ORAL_TABLET | Freq: Once | ORAL | Status: AC
  Administered 2015-10-10: 500 mg via ORAL

## 2015-10-10 MED ORDER — LIDOCAINE HCL 2 % EX GEL
1.0000 "application " | Freq: Once | CUTANEOUS | Status: AC
  Administered 2015-10-10: 1 via URETHRAL

## 2015-10-10 NOTE — Progress Notes (Signed)
8:31 AM  10/10/2015  Cory Hardin 05-09-1946 [id number]  Referring provider: Albina Billet, MD [address]   [place], [place] [address]   HPI: 70 yo M w/ recurrent nephrolithiasis, bladder stones, recurrent bladder CA, BPH s/p TURP  returns to the office today for cystoscopy.  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 He has a recurrence and undenwent TURBT in 08/2014, pathology LgTA.  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  S/p BCG x 6 completed 02/2015.  Cysto 06/2015 suspicious for recurrent, TURBT c/w granulomatous inflammation/ chronic cystitis.    Nephrolithiasis: He has an extensive history of recurrent stones. He is undergone multiple stone procedures in the past. 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  Most recent KUB on 05/28/15 shows a new left lower pole renal calculus along with a 13 mm calcification in the possible right distal ureter s/p R URS, LL on 06/23/15 s/p URS.   Follow-up renal ultrasound from 09/2015 shows no hydronephrosis. Bilateral nonobstructing stones present. Currently asymptomatic without flank pain.  BPH: History of BPH, bladder stones S/p TURP 04/2014.  Voiding well.   PMH: Past Medical History  Diagnosis Date  . Cancer ([place])     bladder  . BPH (benign prostatic hyperplasia)   . Hypertension   . Chronic kidney disease     stones,renal failure  . Hyperlipidemia   . Hematuria   . Bilateral kidney stones   . 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 Diabetes mellitus without complication ([place])   . GERD (gastroesophageal reflux disease)     Surgical History: Past Surgical History  Procedure Laterality Date  . Transurethral resection of bladder tumor with gyrus (turbt-gyrus)    . Cystoscopy    . Bladder stones    . Percutaneous nephrolithotripsy     . Appendectomy    . Cystoscopy with biopsy N/A 11/20/2014    Procedure: CYSTOSCOPY WITH BIOPSY;  Surgeon: [name], MD;  Location: [facility];  Service: Urology;  Laterality: N/A;  . Ureteroscopy with holmium laser lithotripsy Right 06/23/2015    Procedure: URETEROSCOPY WITH HOLMIUM LASER LITHOTRIPSY;  Surgeon: [name], MD;  Location: [facility];  Service: Urology;  Laterality: Right;  . Cystoscopy with stent placement Right 06/23/2015    Procedure: CYSTOSCOPY WITH STENT PLACEMENT;  Surgeon: [name], MD;  Location: [facility];  Service: Urology;  Laterality: Right;  . Transurethral resection of bladder tumor N/A 06/23/2015    Procedure: TRANSURETHRAL RESECTION OF BLADDER TUMOR (TURBT);  Surgeon: [name], MD;  Location: [facility];  Service: Urology;  Laterality: N/A;    Home Medications:    Medication List       This list is accurate as of: 10/10/15  8:31 AM.  Always use your most recent med list.               carvedilol 12.5 MG tablet  Commonly known as:  COREG  Take 12.5 mg by mouth 2 (two) times daily with a meal.     Choline Fenofibrate 135 MG capsule  Take 135 mg by mouth daily. am     glimepiride 1 MG tablet  Commonly known as:  AMARYL  Take 1 mg by mouth daily.     hydrochlorothiazide 25 MG tablet  Commonly known as:  HYDRODIURIL  Take 1 tablet (  25 mg total) by mouth daily.     HYDROcodone-acetaminophen 5-325 MG tablet  Commonly known as:  NORCO/VICODIN  Take 1-2 tablets by mouth every 6 (six) hours as needed for moderate pain.     omeprazole 20 MG capsule  Commonly known as:  PRILOSEC  Take 20 mg by mouth daily.     oxybutynin 5 MG tablet  Commonly known as:  DITROPAN  Take 1 tablet (5 mg total) by mouth every 8 (eight) hours as needed for bladder spasms.     pravastatin 40 MG tablet  Commonly known as:  PRAVACHOL  Take 40 mg by mouth daily. pm     tamsulosin 0.4 MG Caps capsule  Commonly known as:  FLOMAX  Take 1 capsule (0.4 mg  total) by mouth daily.        Allergies: No Known Allergies  Family History: Family History  Problem Relation Age of Onset  . Heart disease Father   . Heart disease Mother   . Cancer Maternal Aunt   . Heart disease Brother   . Kidney disease Neg Hx   . Prostate cancer Neg Hx     Social History:  reports that he quit smoking about 32 years ago. His smoking use included Cigarettes. He has a 3.75 pack-year smoking history. He has never used smokeless tobacco. He reports that he drinks about 0.6 oz of alcohol per week. He reports that he does not use illicit drugs.  Physical Exam: There were no vitals taken for this visit.  Constitutional:  Alert and oriented, No acute distress. HEENT: [place] AT, moist mucus membranes.  Trachea midline, no masses. Cardiovascular: No clubbing, cyanosis, or edema.  Respiratory: Normal respiratory effort, no increased work of breathing.  GI: Abdomen is soft, nontender, nondistended, no abdominal masses GU: No CVA tenderness.  Normal circumcised phallus. Skin: No rashes, bruises or suspicious lesions. Neurologic: Grossly intact, no focal deficits, moving all 4 extremities. Psychiatric: Normal mood and affect.  Laboratory Data: Lab Results  Component Value Date   WBC 5.4 06/19/2015   HGB 14.3 06/19/2015   HCT 42.2 06/19/2015   MCV 91.0 06/19/2015   PLT 139* 06/19/2015    Lab Results  Component Value Date   CREATININE 1.45* 06/19/2015     Urinalysis Reviewed, no evidence of infection   Renal ultrasound Study Result     CLINICAL DATA: Patient with history of renal stones.  EXAM: RENAL / URINARY TRACT ULTRASOUND COMPLETE  COMPARISON: Abdominal radiograph 06/09/2015.  FINDINGS: Right Kidney:  Length: 12.1 cm. Normal renal cortical thickness and echogenicity. Within the lower pole there is an 8 mm echogenic shadowing focus, compatible with a stone. No hydronephrosis.  Left Kidney:  Length: 11.0 cm. Normal renal cortical  thickness and echogenicity. Within the lower pole there is a 10 mm shadowing echogenic focus, most compatible with a stone. No hydronephrosis.  Bladder:  Mild circumferential urinary bladder wall thickening, demonstrated on prior imaging, nonspecific.  IMPRESSION: Bilateral nephrolithiasis.  No hydronephrosis.   Electronically Signed  By: Lovey Newcomer M.D.  On: 09/29/2015 13:22    Cystoscopy Procedure Note  Patient identification was confirmed, informed consent was obtained, and patient was prepped using Betadine solution.  Lidocaine jelly was administered per urethral meatus.    Preoperative abx where received prior to procedure.     Pre-Procedure: - Inspection reveals a normal caliber ureteral meatus.  Procedure: The flexible cystoscope was introduced without difficulty - No urethral strictures/lesions are present. - TURP defect noted of prostate, widely  patent - Normal bladder neck - Bilateral ureteral orifices identified - Bladder with one area on posterior wall of bladder which is site of previous biopsy, approximately 1 cm in diameter, appears ulcerated with overlying calcification. This is stable and has appearance of previous site which was biopsied and negative for cancer.  Otherwise no other lesions, tumors, or masses.  - No bladder stones - No trabeculation  Retroflexion unremarkable.   Post-Procedure: - Patient tolerated the procedure well   Post-Procedure: - Patient tolerated the procedure well   Assessment & Plan:    1.History of bladder cancer Recurrent low-grade Ta bladder cancer first diagnosed 03/07/2014 with recurrence LgTa 2/016. 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  He completed 6 week course of BCG  completed  02/2015.  Repeat TUR 06/2015 negative for malignancy.  2. Calcium nephrolithiasis Status post multiple procedures including ureteroscopy 2,  ESWL, and remote history of PCNL and most recent right URS, LL, stent on 06/2015.   Bilateral nephrolithiasis on RUS KUB next visit Call if develops flank pain sooner -HCTZ 25mg  continue, consider referral to [facility] (Dr. Fabio Asa) for further evaluation of active metabolic stone formation   3. BPH (benign prostatic hyperplasia) s/p TURP on 04/17/14. Pathology negative. Voiding well PSA drawn today/ refused DRE --> will consider next visit per patient  F/u in 3 months for cystoscopy   [facility] [address] [place], [place] [address] [phone]

## 2015-10-11 LAB — PSA: Prostate Specific Ag, Serum: 4.3 ng/mL — ABNORMAL HIGH (ref 0.0–4.0)

## 2015-12-08 ENCOUNTER — Other Ambulatory Visit: Payer: Self-pay | Admitting: Urology

## 2016-01-14 ENCOUNTER — Other Ambulatory Visit: Payer: Self-pay | Admitting: Urology

## 2016-01-16 ENCOUNTER — Other Ambulatory Visit: Payer: Self-pay

## 2016-01-16 ENCOUNTER — Ambulatory Visit
Admission: RE | Admit: 2016-01-16 | Discharge: 2016-01-16 | Disposition: A | Discharge status: Home / Self Care | Payer: Medicare Other | Source: Ambulatory Visit | Attending: Urology | Admitting: Urology

## 2016-01-16 ENCOUNTER — Ambulatory Visit (INDEPENDENT_AMBULATORY_CARE_PROVIDER_SITE_OTHER): Payer: Medicare Other | Admitting: Urology

## 2016-01-16 VITALS — BP 185/96 | HR 65 | Ht 72.0 in | Wt 201.0 lb

## 2016-01-16 DIAGNOSIS — R972 Elevated prostate specific antigen [PSA]: Secondary | ICD-10-CM

## 2016-01-16 DIAGNOSIS — N200 Calculus of kidney: Secondary | ICD-10-CM | POA: Diagnosis not present

## 2016-01-16 DIAGNOSIS — C679 Malignant neoplasm of bladder, unspecified: Secondary | ICD-10-CM

## 2016-01-16 DIAGNOSIS — Z8551 Personal history of malignant neoplasm of bladder: Secondary | ICD-10-CM

## 2016-01-16 DIAGNOSIS — N400 Enlarged prostate without lower urinary tract symptoms: Secondary | ICD-10-CM | POA: Diagnosis not present

## 2016-01-16 LAB — URINALYSIS, COMPLETE
Bilirubin, UA: NEGATIVE
Glucose, UA: NEGATIVE
Ketones, UA: NEGATIVE
Nitrite, UA: NEGATIVE
Protein, UA: NEGATIVE
RBC, UA: NEGATIVE
Specific Gravity, UA: 1.025 (ref 1.005–1.030)
Urobilinogen, Ur: 0.2 mg/dL (ref 0.2–1.0)
pH, UA: 5.5 (ref 5.0–7.5)

## 2016-01-16 LAB — MICROSCOPIC EXAMINATION

## 2016-01-16 MED ORDER — LIDOCAINE HCL 2 % EX GEL
1.0000 "application " | Freq: Once | CUTANEOUS | Status: AC
  Administered 2016-01-16: 1 via URETHRAL

## 2016-01-16 MED ORDER — HYDROCHLOROTHIAZIDE 25 MG PO TABS: 25.0000 mg | ORAL_TABLET | Freq: Every day | ORAL | Status: DC

## 2016-01-16 MED ORDER — CIPROFLOXACIN HCL 500 MG PO TABS
500.0000 mg | ORAL_TABLET | Freq: Once | ORAL | Status: AC
  Administered 2016-01-16: 500 mg via ORAL

## 2016-01-16 NOTE — Progress Notes (Signed)
   01/16/2016  HPI:  History of bladder cancer  Cystoscopy Procedure Note  Patient identification was confirmed, informed consent was obtained, and patient was prepped using Betadine solution. Lidocaine jelly was administered per urethral meatus.   Preoperative abx where received prior to procedure.    Pre-Procedure: - Inspection reveals a normal caliber ureteral meatus.  Procedure: The flexible cystoscope was introduced without difficulty - No urethral strictures/lesions are present. - TURP defect noted of prostate, widely patent - Normal bladder neck - Bilateral ureteral orifices identified - Bladder with one area on posterior wall of bladder which is site of previous biopsy, less than 1 cm in diameter, appears mildy ulcerated with overlying calcification, slightly less remarkable than previous cysto . This is stable and has appearance of previous site which was biopsied and negative for cancer. Otherwise no other lesions, tumors, or masses.  - No bladder stones - No trabeculation  Retroflexion unremarkable.   Post-Procedure: - Patient tolerated the procedure well   Post-Procedure: - Patient tolerated the procedure well

## 2016-01-16 NOTE — Progress Notes (Addendum)
9:15 AM  01/16/2016  Cory Hardin 1946-02-12 [id number]  Referring provider: Albina Billet, MD [address]   [place], [place] [address]   HPI: 70 yo M w/ recurrent nephrolithiasis, bladder stones, recurrent bladder CA, BPH s/p TURP, elevated PSA who returns for routine follow up.    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 He has a recurrence and undenwent TURBT in 08/2014, pathology LgTA.  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  S/p BCG x 6 completed 02/2015.  Cysto 06/2015 suspicious for recurrent, TURBT c/w granulomatous inflammation/ chronic cystitis.    Cysto today with stable ulceration with on right posterior wall --> biopsy proven negative for malignancy.  Nephrolithiasis: He has an extensive history of recurrent stones. He is undergone multiple stone procedures in the past. 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   Most recent intervention s/p R URS, LL on 06/23/15 s/p URS.   Follow-up renal ultrasound from 09/2015 shows no hydronephrosis. Bilateral nonobstructing stones present. Currently asymptomatic without flank pain. KUB today with 1.6 cm LLP stone, increased from 1.3 6 months ago.  Right small stone stable.    BPH: History of BPH, bladder stones S/p TURP 04/2014.  Voiding well.    Elevated PSA: PSA elevated on 10/10/15 to 4.3.  DRE today.  PSA repeated today.  PMH: Past Medical History  Diagnosis Date  . Cancer ([place])     bladder  . BPH (benign prostatic hyperplasia)   . Hypertension   . Chronic kidney disease     stones,renal failure  . Hyperlipidemia   . Hematuria   . Bilateral kidney stones   . Cancer of lateral wall of urinary bladder ([place]) 12/05/2014  . Diabetes mellitus without complication ([place])   . GERD (gastroesophageal reflux disease)     Surgical History: Past Surgical  History  Procedure Laterality Date  . Transurethral resection of bladder tumor with gyrus (turbt-gyrus)    . Cystoscopy    . Bladder stones    . Percutaneous nephrolithotripsy    . Appendectomy    . Cystoscopy with biopsy N/A 11/20/2014    Procedure: CYSTOSCOPY WITH BIOPSY;  Surgeon: [name], MD;  Location: [facility];  Service: Urology;  Laterality: N/A;  . Ureteroscopy with holmium laser lithotripsy Right 06/23/2015    Procedure: URETEROSCOPY WITH HOLMIUM LASER LITHOTRIPSY;  Surgeon: [name], MD;  Location: [facility];  Service: Urology;  Laterality: Right;  . Cystoscopy with stent placement Right 06/23/2015    Procedure: CYSTOSCOPY WITH STENT PLACEMENT;  Surgeon: [name], MD;  Location: [facility];  Service: Urology;  Laterality: Right;  . Transurethral resection of bladder tumor N/A 06/23/2015    Procedure: TRANSURETHRAL RESECTION OF BLADDER TUMOR (TURBT);  Surgeon: [name], MD;  Location: [facility];  Service: Urology;  Laterality: N/A;    Home Medications:    Medication List       This list is accurate as of: 01/16/16  9:15 AM.  Always use your most recent med list.               ACCU-CHEK AVIVA PLUS test strip  Generic drug:  glucose blood     ACCU-CHEK SOFTCLIX LANCETS lancets     carvedilol 12.5 MG tablet  Commonly known as:  COREG  Take 12.5 mg by mouth 2 (two) times daily with  a meal.     fenofibrate 160 MG tablet     glimepiride 1 MG tablet  Commonly known as:  AMARYL  Take 1 mg by mouth daily.     hydrochlorothiazide 25 MG tablet  Commonly known as:  HYDRODIURIL  TAKE ONE TABLET BY MOUTH ONCE DAILY     omeprazole 20 MG capsule  Commonly known as:  PRILOSEC  Take 20 mg by mouth daily.     pravastatin 40 MG tablet  Commonly known as:  PRAVACHOL  Take 40 mg by mouth daily. pm        Allergies: No Known Allergies  Family History: Family History  Problem Relation Age of Onset  . Heart disease Father   . Heart disease Mother     . Cancer Maternal Aunt   . Heart disease Brother   . Kidney disease Neg Hx   . Prostate cancer Neg Hx     Social History:  reports that he quit smoking about 33 years ago. His smoking use included Cigarettes. He has a 3.75 pack-year smoking history. He has never used smokeless tobacco. He reports that he drinks about 0.6 oz of alcohol per week. He reports that he does not use illicit drugs.  Physical Exam: BP 185/96 mmHg  Pulse 65  Ht 6' (1.829 m)  Wt 201 lb (91.173 kg)  BMI 27.25 kg/m2  Constitutional:  Alert and oriented, No acute distress. HEENT: [place] AT, moist mucus membranes.  Trachea midline, no masses. Cardiovascular: No clubbing, cyanosis, or edema.  Respiratory: Normal respiratory effort, no increased work of breathing.  GI: Abdomen is soft, nontender, nondistended, no abdominal masses GU: No CVA tenderness.  Normal circumcised phallus. DRE: Normal sphincter tone. External hemorrhoids noted. Asymmetric prostate gland, predominant left lateral ridge compared to right without obvious nodules. Nontender. 40 g. Skin: No rashes, bruises or suspicious lesions. Neurologic: Grossly intact, no focal deficits, moving all 4 extremities. Psychiatric: Normal mood and affect.  Laboratory Data: Lab Results  Component Value Date   WBC 5.4 06/19/2015   HGB 14.3 06/19/2015   HCT 42.2 06/19/2015   MCV 91.0 06/19/2015   PLT 139* 06/19/2015    Lab Results  Component Value Date   CREATININE 1.45* 06/19/2015   Component     Latest Ref Rng 10/10/2015  PSA     0.0 - 4.0 ng/mL 4.3 (H)    Urinalysis Reviewed, no evidence of infection   KUB   Study Result     CLINICAL DATA: Possible nephrolithiasis. History of bladder cancer  EXAM: ABDOMEN - 1 VIEW  COMPARISON: 06/09/2015  FINDINGS: Normal small bowel gas pattern. Again noted at least 2 calcified calculi lower pole of the right kidney the largest measures 2.3 mm. Calcified calculus in lower pole region of the left  kidney measures 1.6 cm. Poorly visualized probable bladder calcification mid pelvis. Stable left pelvic phlebolith.  IMPRESSION: Again noted at least 2 calcified calculi lower pole of the right kidney the largest measures 2.3 mm. Calcified calculus in lower pole region of the left kidney measures 1.6 cm. Poorly visualized probable bladder calcification mid pelvis. Stable left pelvic phlebolith.   Electronically Signed  By: Lahoma Crocker M.D.  On: 01/16/2016 08:33    KUB reviewed personally today. This does show stable stone on the right, slight interval increase in the size of left-sided lower pole stone burden.  Assessment & Plan:    1.History of bladder cancer Recurrent low-grade Ta bladder cancer first diagnosed 03/07/2014 with recurrence LgTa 2/016.  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  He completed 6 week course of BCG  completed  02/2015.  Repeat TUR 06/2015 negative for malignancy. NED today, stable granulomatous lesion on posterior wall.  2. Elevated PSA Repeat PSA today.  Asymmetric DRE.  I have counseled the patient that if his PSA remained elevated, I would recommend proceeding prostate biopsy.  We discussed prostate biopsy in detail including the procedure itself, the risks of blood in the urine, stool, and ejaculate, serious infection, and discomfort.  Will call with recommendations/ plan.  3. Calcium nephrolithiasis Status post multiple procedures including ureteroscopy 2, ESWL, and remote history of PCNL and most recent right URS, LL, stent on 06/2015.   Bilateral nephrolithiasis on RUS/ KUB today with slightly increased some burden over past 6 months No interested in intervention at this time, will repeat KUB in 6 months (07/2015) Call if develops flank pain sooner HCTZ 25mg  continue, consider referral to [facility] (Dr. Eulas Post Preminger) for further evaluation of active metabolic stone  formation  4. BPH (benign prostatic hyperplasia) s/p TURP on 04/17/14. Pathology negative. Voiding well   F/u in 3 months for cystoscopy  [facility] [address], [place], [place] [address] [phone]  Addendum:  PSA 3.3.  Recommend close f/u.  Recommend repeat in 6 months.

## 2016-01-16 NOTE — Progress Notes (Signed)
Per Dr. Erlene Quan pt should be on HCTZ indefinitely.

## 2016-01-17 LAB — PSA: Prostate Specific Ag, Serum: 3.3 ng/mL (ref 0.0–4.0)

## 2016-01-19 ENCOUNTER — Telehealth: Payer: Self-pay | Admitting: Family Medicine

## 2016-01-19 NOTE — Telephone Encounter (Signed)
I have making his appts and I noticed that he has an appt for a cysto in October. Do you want him to keep this appt?   Thanks  [employer]

## 2016-01-19 NOTE — Telephone Encounter (Signed)
-----   Message from [name], MD sent at 01/17/2016  8:58 PM EDT ----- PSA back down to 3.3.  Recommend that we continue to follow, recheck in 6 months.  [name], MD

## 2016-01-19 NOTE — Telephone Encounter (Signed)
Ok thank you for clarifying i was told a couple of different things but I'm straight now.   michelle

## 2016-01-19 NOTE — Telephone Encounter (Signed)
Patient notified of lab results, voiced understanding

## 2016-01-19 NOTE — Telephone Encounter (Signed)
Just to clarify, no need for f/u appt.  He will be seeing [place] q3 months for cysto.  Will recheck PSA at visit after next prior to the procedure.    [name], MD

## 2016-02-09 ENCOUNTER — Encounter: Payer: Self-pay | Admitting: Emergency Medicine

## 2016-02-09 ENCOUNTER — Emergency Department
Admission: EM | Admit: 2016-02-09 | Discharge: 2016-02-09 | Disposition: A | Discharge status: Home / Self Care | Payer: Medicare Other | Attending: Emergency Medicine | Admitting: Emergency Medicine

## 2016-02-09 ENCOUNTER — Emergency Department: Payer: Medicare Other

## 2016-02-09 DIAGNOSIS — Z8551 Personal history of malignant neoplasm of bladder: Secondary | ICD-10-CM | POA: Insufficient documentation

## 2016-02-09 DIAGNOSIS — I87091 Postthrombotic syndrome with other complications of right lower extremity: Secondary | ICD-10-CM

## 2016-02-09 DIAGNOSIS — Z79899 Other long term (current) drug therapy: Secondary | ICD-10-CM | POA: Insufficient documentation

## 2016-02-09 DIAGNOSIS — N189 Chronic kidney disease, unspecified: Secondary | ICD-10-CM | POA: Diagnosis not present

## 2016-02-09 DIAGNOSIS — I129 Hypertensive chronic kidney disease with stage 1 through stage 4 chronic kidney disease, or unspecified chronic kidney disease: Secondary | ICD-10-CM | POA: Diagnosis not present

## 2016-02-09 DIAGNOSIS — I82401 Acute embolism and thrombosis of unspecified deep veins of right lower extremity: Secondary | ICD-10-CM | POA: Diagnosis not present

## 2016-02-09 DIAGNOSIS — M79604 Pain in right leg: Secondary | ICD-10-CM | POA: Diagnosis present

## 2016-02-09 DIAGNOSIS — I87391 Chronic venous hypertension (idiopathic) with other complications of right lower extremity: Secondary | ICD-10-CM | POA: Insufficient documentation

## 2016-02-09 DIAGNOSIS — E1122 Type 2 diabetes mellitus with diabetic chronic kidney disease: Secondary | ICD-10-CM | POA: Diagnosis not present

## 2016-02-09 DIAGNOSIS — Z87891 Personal history of nicotine dependence: Secondary | ICD-10-CM | POA: Insufficient documentation

## 2016-02-09 LAB — BASIC METABOLIC PANEL
Anion gap: 8 (ref 5–15)
BUN: 28 mg/dL — ABNORMAL HIGH (ref 6–20)
CO2: 26 mmol/L (ref 22–32)
Calcium: 9.1 mg/dL (ref 8.9–10.3)
Chloride: 102 mmol/L (ref 101–111)
Creatinine, Ser: 1.49 mg/dL — ABNORMAL HIGH (ref 0.61–1.24)
GFR calc Af Amer: 53 mL/min — ABNORMAL LOW (ref >60)
GFR calc non Af Amer: 46 mL/min — ABNORMAL LOW (ref >60)
Glucose, Bld: 286 mg/dL — ABNORMAL HIGH (ref 65–99)
Potassium: 3.5 mmol/L (ref 3.5–5.1)
Sodium: 136 mmol/L (ref 135–145)

## 2016-02-09 LAB — CBC WITH DIFFERENTIAL/PLATELET
Basophils Absolute: 0 10*3/uL (ref 0–0.1)
Basophils Relative: 0 %
Eosinophils Absolute: 0.2 10*3/uL (ref 0–0.7)
Eosinophils Relative: 2 %
HCT: 41.7 % (ref 40.0–52.0)
Hemoglobin: 14.9 g/dL (ref 13.0–18.0)
Lymphocytes Relative: 10 %
Lymphs Abs: 1 10*3/uL (ref 1.0–3.6)
MCH: 32.3 pg (ref 26.0–34.0)
MCHC: 35.7 g/dL (ref 32.0–36.0)
MCV: 90.4 fL (ref 80.0–100.0)
Monocytes Absolute: 0.7 10*3/uL (ref 0.2–1.0)
Monocytes Relative: 7 %
Neutro Abs: 8 10*3/uL — ABNORMAL HIGH (ref 1.4–6.5)
Neutrophils Relative %: 81 %
Platelets: 133 10*3/uL — ABNORMAL LOW (ref 150–440)
RBC: 4.61 MIL/uL (ref 4.40–5.90)
RDW: 13.1 % (ref 11.5–14.5)
WBC: 9.8 10*3/uL (ref 3.8–10.6)

## 2016-02-09 LAB — PROTIME-INR
INR: 1.39
Prothrombin Time: 17.2 seconds — ABNORMAL HIGH (ref 11.4–15.2)

## 2016-02-09 LAB — CK: Total CK: 42 U/L — ABNORMAL LOW (ref 49–397)

## 2016-02-09 MED ORDER — APIXABAN 5 MG PO TABS
10.0000 mg | ORAL_TABLET | Freq: Once | ORAL | Status: AC
  Administered 2016-02-09: 10 mg via ORAL
  Filled 2016-02-09: qty 2

## 2016-02-09 MED ORDER — APIXABAN 5 MG PO TABS: 10.0000 mg | ORAL_TABLET | Freq: Two times a day (BID) | ORAL | 0 refills | Status: DC

## 2016-02-09 NOTE — ED Notes (Addendum)
MD at bedside. 

## 2016-02-09 NOTE — ED Provider Notes (Addendum)
[facility] Emergency Department Provider Note  ____________________________________________   I have reviewed the triage vital signs and the nursing notes.   HISTORY  Chief Complaint Leg Pain    HPI Cory Hardin is a 70 y.o. male with a remote history of bladder cancer, sister has a history of blood clots, states he went for a long walk on Thursday, today is Monday, and since that time he has had pain to the right calf. No numbness no weakness no fever no chills no history of claudication. Is better when he ices it and elevates it, it is worse when he walks. He has not had anything like this before. No personal history of PE or DVT no recent travel. Patient does not take any blood thinners. No chest pain or shortness of breath. Pain is mostly in the posterior popliteal region as well as in the upper calf.      Past Medical History:  Diagnosis Date  . Bilateral kidney stones   . BPH (benign prostatic hyperplasia)   . Cancer ([place])    bladder  . Cancer of lateral wall of urinary bladder ([place]) 12/05/2014  . Chronic kidney disease    stones,renal failure  . Diabetes mellitus without complication ([place])   . GERD (gastroesophageal reflux disease)   . Hematuria   . Hyperlipidemia   . Hypertension     Patient Active Problem List   Diagnosis Date Noted  . Bladder cancer ([place]) 01/28/2015  . 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 Cancer of lateral wall of urinary bladder ([place]) 12/05/2014  . BPH (benign prostatic hyperplasia) 12/05/2014    Past Surgical History:  Procedure Laterality Date  . APPENDECTOMY    . bladder stones    . CYSTOSCOPY    . CYSTOSCOPY WITH BIOPSY N/A 11/20/2014   Procedure: CYSTOSCOPY WITH BIOPSY;  Surgeon: [name], MD;  Location: [facility];  Service: Urology;  Laterality: N/A;  . CYSTOSCOPY WITH STENT PLACEMENT Right 06/23/2015   Procedure: CYSTOSCOPY WITH STENT PLACEMENT;  Surgeon: [name], MD;  Location: [facility];   Service: Urology;  Laterality: Right;  . PERCUTANEOUS NEPHROLITHOTRIPSY    . TRANSURETHRAL RESECTION OF BLADDER TUMOR N/A 06/23/2015   Procedure: TRANSURETHRAL RESECTION OF BLADDER TUMOR (TURBT);  Surgeon: [name], MD;  Location: [facility];  Service: Urology;  Laterality: N/A;  . TRANSURETHRAL RESECTION OF BLADDER TUMOR WITH GYRUS (TURBT-GYRUS)    . URETEROSCOPY WITH HOLMIUM LASER LITHOTRIPSY Right 06/23/2015   Procedure: URETEROSCOPY WITH HOLMIUM LASER LITHOTRIPSY;  Surgeon: [name], MD;  Location: [facility];  Service: Urology;  Laterality: Right;    Current Outpatient Rx  . Order #: [id number] Class: Historical Med  . Order #: [id number] Class: Historical Med  . Order #: [id number] Class: Historical Med  . Order #: [id number] Class: Historical Med  . Order #: [id number] Class: Historical Med  . Order #: [id number] Class: Fax  . Order #: [id number] Class: Historical Med  . Order #: [id number] Class: Historical Med    Allergies Review of patient's allergies indicates no known allergies.  Family History  Problem Relation Age of Onset  . Heart disease Father   . Heart disease Mother   . Cancer Maternal Aunt   . Heart disease Brother   . Kidney disease Neg Hx   . Prostate cancer Neg Hx     Social History Social History  Substance Use Topics  . Smoking status: Former Smoker    Packs/day: 0.25    Years: 15.00    Types: Cigarettes    Quit  date: 11/13/1982  . Smokeless tobacco: Never Used  . Alcohol use 0.6 oz/week    1 Standard drinks or equivalent per week     Comment: rare    Review of Systems Constitutional: No fever/chills Eyes: No visual changes. ENT: No sore throat. No stiff neck no neck pain Cardiovascular: Denies chest pain. Respiratory: Denies shortness of breath. Gastrointestinal:   no vomiting.  No diarrhea.  No constipation. Genitourinary: Negative for dysuria. Musculoskeletal: History of present illness Skin: Negative for rash. Neurological: Negative for  severe headaches, focal weakness or numbness. 10-point ROS otherwise negative.  ____________________________________________   PHYSICAL EXAM:  VITAL SIGNS: ED Triage Vitals  Enc Vitals Group     BP 02/09/16 0834 (!) 147/85     Pulse Rate 02/09/16 0834 67     Resp 02/09/16 0834 16     Temp 02/09/16 0834 97.9 F (36.6 C)     Temp Source 02/09/16 0834 Oral     SpO2 02/09/16 0834 94 %     Weight --      Height --      Head Circumference --      Peak Flow --      Pain Score 02/09/16 0813 10     Pain Loc --      Pain Edu? --      Excl. in [place]? --     Constitutional: Alert and oriented. Well appearing and in no acute distress. Eyes: Conjunctivae are normal. PERRL. EOMI. Head: Atraumatic. Nose: No congestion/rhinnorhea. Mouth/Throat: Mucous membranes are moist.  Oropharynx non-erythematous. Neck: No stridor.   Nontender with no meningismus Cardiovascular: Normal rate, regular rhythm. Grossly normal heart sounds.  Good peripheral circulation. Respiratory: Normal respiratory effort.  No retractions. Lungs CTAB. Abdominal: Soft and nontender. No distention. No guarding no rebound Back:  There is no focal tenderness or step off.  there is no midline tenderness there are no lesions noted. there is no CVA tenderness Musculoskeletal: Right lower extremity reveals some tenderness to palpation and a slight fullness in the popliteal and the proximal calf. Patient has tenderness to palpation but no erythema not red and not hot to touch. Pain with dorsiflexion of the foot is noted. 2+ DP and posterior tibial pulses were strong Refill. There is no evidence of cellulitis. Compartments are soft. There is a slight degree of difference in terms of size between the right and the left leg, the right being slightly larger. No pitting edema., no upper extremity tenderness. No joint effusions, strong distal pulses  Neurologic:  Normal speech and language. No gross focal neurologic deficits are appreciated.   Skin:  Skin is warm, dry and intact. No rash noted. Psychiatric: Mood and affect are normal. Speech and behavior are normal.  ____________________________________________   LABS (all labs ordered are listed, but only abnormal results are displayed)  Labs Reviewed  BASIC METABOLIC PANEL  CBC WITH DIFFERENTIAL/PLATELET  CK  PROTIME-INR   ____________________________________________  EKG  I personally interpreted any EKGs ordered by me or triage  ____________________________________________  RADIOLOGY  I reviewed any imaging ordered by me or triage that were performed during my shift and, if possible, patient and/or family made aware of any abnormal findings. ____________________________________________   PROCEDURES  Procedure(s) performed: None  Procedures  Critical Care performed: None  ____________________________________________   INITIAL IMPRESSION / ASSESSMENT AND PLAN / ED COURSE  Pertinent labs & imaging results that were available during my care of the patient were reviewed by me and considered in my  medical decision making (see chart for details).  Patient with tenderness to palpation in his leg after ambulation on Thursday. Could be a pulled muscle baker cyst or DVT. No evidence of vascular compromise or infection at this time no evidence of bony tenderness in the knee, no joint effusion this does not appear to be a joint or bone issue. He has soft tissue discomfort in that lower extremity. He does have a family history of DVT and he himself has had cancer in the past, so we will order a Doppler ultrasound. No evidence of PE. Recheck basic blood work and reassess.  Clinical Course   ----------------------------------------- 9:40 AM on 02/09/2016 -----------------------------------------  Called by radiology and made aware of dvt.  Awaiting blood work.  Have called lab.   ----------------------------------------- 9:58 AM on  02/09/2016 -----------------------------------------  Paging vascular surgery for possible clot removal.   ----------------------------------------- 10:24 AM on 02/09/2016 -----------------------------------------  Dr. dew recommends Eliquis for the patient and he will see him in the office tomorrow for possible clot extraction. Patient aware that he should come back to the emergency room immediately by ambulance if he has chest pain or shortness of breath or other new or worrisome symptoms. The patient understands the need for follow-up. He understands the need for anticoagulation. He's had no head bleeds, no rectal bleeding, no recent surgery and no other Indications known to anticoagulation. Given that he is to have close follow-up with surgery tomorrow, I'll only write him for 1 week of anticoagulation will she give him time to follow-up. He understands he must see his doctor or his vascular surgeon for re-dosing of this medication before the end of that week. ____________________________________________   FINAL CLINICAL IMPRESSION(S) / ED DIAGNOSES  Final diagnoses:  None      This chart was dictated using voice recognition software.  Despite best efforts to proofread,  errors can occur which can change meaning.      Schuyler Amor, MD 02/09/16 [id number]    Schuyler Amor, MD 02/09/16 [place], MD 02/09/16 [place], MD 02/09/16 1026

## 2016-02-09 NOTE — ED Triage Notes (Signed)
Pt to ed with c/o right leg pain and swelling since Thursday.

## 2016-02-09 NOTE — ED Notes (Signed)
Pt back in room.

## 2016-02-09 NOTE — ED Notes (Signed)
Pt to US.

## 2016-04-19 ENCOUNTER — Other Ambulatory Visit: Payer: Self-pay

## 2016-04-19 ENCOUNTER — Other Ambulatory Visit: Payer: Medicare Other

## 2016-04-19 DIAGNOSIS — R972 Elevated prostate specific antigen [PSA]: Secondary | ICD-10-CM

## 2016-04-20 LAB — PSA: Prostate Specific Ag, Serum: 2.9 ng/mL (ref 0.0–4.0)

## 2016-04-23 ENCOUNTER — Ambulatory Visit (INDEPENDENT_AMBULATORY_CARE_PROVIDER_SITE_OTHER): Payer: Medicare Other | Admitting: Urology

## 2016-04-23 VITALS — BP 179/98 | HR 59 | Ht 72.0 in | Wt 193.0 lb

## 2016-04-23 DIAGNOSIS — C679 Malignant neoplasm of bladder, unspecified: Secondary | ICD-10-CM

## 2016-04-23 DIAGNOSIS — N200 Calculus of kidney: Secondary | ICD-10-CM | POA: Diagnosis not present

## 2016-04-23 DIAGNOSIS — R972 Elevated prostate specific antigen [PSA]: Secondary | ICD-10-CM | POA: Diagnosis not present

## 2016-04-23 DIAGNOSIS — N400 Enlarged prostate without lower urinary tract symptoms: Secondary | ICD-10-CM

## 2016-04-23 LAB — URINALYSIS, COMPLETE
Bilirubin, UA: NEGATIVE
Glucose, UA: NEGATIVE
Ketones, UA: NEGATIVE
Nitrite, UA: NEGATIVE
Protein, UA: NEGATIVE
Specific Gravity, UA: 1.025 (ref 1.005–1.030)
Urobilinogen, Ur: 0.2 mg/dL (ref 0.2–1.0)
pH, UA: 5.5 (ref 5.0–7.5)

## 2016-04-23 LAB — MICROSCOPIC EXAMINATION

## 2016-04-23 MED ORDER — CIPROFLOXACIN HCL 500 MG PO TABS
500.0000 mg | ORAL_TABLET | Freq: Once | ORAL | Status: AC
  Administered 2016-04-23: 500 mg via ORAL

## 2016-04-23 MED ORDER — LIDOCAINE HCL 2 % EX GEL
1.0000 "application " | Freq: Once | CUTANEOUS | Status: AC
  Administered 2016-04-23: 1 via URETHRAL

## 2016-04-23 NOTE — Progress Notes (Signed)
9:21 AM  04/23/16  Cory Hardin 21-Jul-1945 [id number]  Referring provider: Albina Billet, MD [address]   [place], [place] [address]   HPI: 70 yo M w/ recurrent nephrolithiasis, bladder stones, recurrent bladder CA, BPH s/p TURP, history of elevated PSA who returns for routine follow up/ cystoscopy.  Since last visit, he has been diagnosed with another lower extremity DVT.  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 He has a recurrence and undenwent TURBT in 08/2014, pathology LgTA.  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  S/p BCG x 6 completed 02/2015.  Cysto 06/2015 suspicious for recurrent, TURBT c/w granulomatous inflammation/ chronic cystitis.    Cysto today fairly unremarkable.    Nephrolithiasis: He has an extensive history of recurrent stones. He is undergone multiple stone procedures in the past. 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   Most recent intervention s/p R URS, LL on 06/23/15 s/p URS.   Follow-up renal ultrasound from 09/2015 shows no hydronephrosis. Bilateral nonobstructing stones present.Marland Kitchen KUB 01/2016  with 1.6 cm LLP stone, increased from 1.3 6 months ago.  Right small stone stable.  Currently asymptomatic without flank pain, no episodes over past 3 months.  BPH: History of BPH, bladder stones S/p TURP 04/2014.  Voiding well.    Elevated PSA: PSA elevated on 10/10/15 to 4.3,  3.3 7/17, and 2.9 on 10/217.  DRE 7/17 unremarkable .    PMH: Past Medical History:  Diagnosis Date  . Bilateral kidney stones   . BPH (benign prostatic hyperplasia)   . Cancer ([place])    bladder  . Cancer of lateral wall of urinary bladder ([place]) 12/05/2014  . Chronic kidney disease    stones,renal failure  . Diabetes mellitus without complication ([place])   . GERD (gastroesophageal reflux disease)   .  Hematuria   . Hyperlipidemia   . Hypertension     Surgical History: Past Surgical History:  Procedure Laterality Date  . APPENDECTOMY    . bladder stones    . CYSTOSCOPY    . CYSTOSCOPY WITH BIOPSY N/A 11/20/2014   Procedure: CYSTOSCOPY WITH BIOPSY;  Surgeon: [name], MD;  Location: [facility];  Service: Urology;  Laterality: N/A;  . CYSTOSCOPY WITH STENT PLACEMENT Right 06/23/2015   Procedure: CYSTOSCOPY WITH STENT PLACEMENT;  Surgeon: [name], MD;  Location: [facility];  Service: Urology;  Laterality: Right;  . PERCUTANEOUS NEPHROLITHOTRIPSY    . TRANSURETHRAL RESECTION OF BLADDER TUMOR N/A 06/23/2015   Procedure: TRANSURETHRAL RESECTION OF BLADDER TUMOR (TURBT);  Surgeon: [name], MD;  Location: [facility];  Service: Urology;  Laterality: N/A;  . TRANSURETHRAL RESECTION OF BLADDER TUMOR WITH GYRUS (TURBT-GYRUS)    . URETEROSCOPY WITH HOLMIUM LASER LITHOTRIPSY Right 06/23/2015   Procedure: URETEROSCOPY WITH HOLMIUM LASER LITHOTRIPSY;  Surgeon: [name], MD;  Location: [facility];  Service: Urology;  Laterality: Right;    Home Medications:    Medication List       Accurate as of 04/23/16  9:21 AM. Always use your most recent med list.          ACCU-CHEK AVIVA PLUS test strip Generic drug:  glucose blood   ACCU-CHEK SOFTCLIX LANCETS lancets   carvedilol 12.5 MG tablet Commonly known as:  COREG Take 12.5 mg by mouth 2 (two) times daily with a meal.   fenofibrate  160 MG tablet Take 160 mg by mouth daily.   glimepiride 1 MG tablet Commonly known as:  AMARYL Take 1 mg by mouth daily.   hydrochlorothiazide 25 MG tablet Commonly known as:  HYDRODIURIL Take 1 tablet (25 mg total) by mouth daily.   omeprazole 20 MG capsule Commonly known as:  PRILOSEC Take 20 mg by mouth daily.   pravastatin 40 MG tablet Commonly known as:  PRAVACHOL Take 40 mg by mouth daily. pm   XARELTO 20 MG Tabs tablet Generic drug:  rivaroxaban       Allergies: No  Known Allergies  Family History: Family History  Problem Relation Age of Onset  . Heart disease Father   . Heart disease Mother   . Cancer Maternal Aunt   . Heart disease Brother   . Kidney disease Neg Hx   . Prostate cancer Neg Hx     Social History:  reports that he quit smoking about 33 years ago. His smoking use included Cigarettes. He has a 3.75 pack-year smoking history. He has never used smokeless tobacco. He reports that he drinks about 0.6 oz of alcohol per week . He reports that he does not use drugs.  Physical Exam: BP (!) 179/98   Pulse (!) 59   Ht 6' (1.829 m)   Wt 193 lb (87.5 kg)   BMI 26.18 kg/m   Constitutional:  Alert and oriented, No acute distress. HEENT: [place] AT, moist mucus membranes.  Trachea midline, no masses. Cardiovascular: No clubbing, cyanosis, or edema.  Respiratory: Normal respiratory effort, no increased work of breathing.  GI: Abdomen is soft, nontender, nondistended, no abdominal masses GU: No CVA tenderness.  Normal circumcised phallus. Skin: No rashes, bruises or suspicious lesions. Neurologic: Grossly intact, no focal deficits, moving all 4 extremities. Psychiatric: Normal mood and affect.  Laboratory Data: Lab Results  Component Value Date   WBC 9.8 02/09/2016   HGB 14.9 02/09/2016   HCT 41.7 02/09/2016   MCV 90.4 02/09/2016   PLT 133 (L) 02/09/2016    Lab Results  Component Value Date   CREATININE 1.49 (H) 02/09/2016    Component     Latest Ref Rng & Units 10/10/2015 01/16/2016 04/19/2016  PSA     0.0 - 4.0 ng/mL 4.3 (H) 3.3 2.9   Urinalysis Reviewed, no evidence of infection     Assessment & Plan:    1.History of bladder cancer Recurrent low-grade Ta bladder cancer first diagnosed 03/07/2014 with recurrence LgTa 2/016. 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  He completed 6 week course of BCG  completed  02/2015.  Repeat TUR 06/2015 negative for  malignancy. NED today on cysto   2. Elevated PSA Most recent PSA/rectal exam lower, non-concerning Will continue annual PSA screening,  due 04/2017  3. Calcium nephrolithiasis Status post multiple procedures including ureteroscopy 2, ESWL, and remote history of PCNL and most recent right URS, LL, stent on 06/2015.   Bilateral nephrolithiasis on RUS/ KUB today with slightly increased some burden over past 6 months No interested in intervention at this time, will repeat KUB in 6 months (07/2015) Call if develops flank pain sooner HCTZ 25mg  continue, consider referral to [facility] (Dr. Eulas Post Preminger) for further evaluation of active metabolic stone formation  4. BPH (benign prostatic hyperplasia) s/p TURP on 04/17/14. Pathology negative. Voiding well   F/u in 3 months for cystoscopy  [facility] [address], [address]  [place], [place] [address] [phone]  Addendum:  PSA 3.3.  Recommend close f/u.  Recommend repeat in 6 months.

## 2016-04-23 NOTE — Progress Notes (Signed)
   04/23/16  HPI:  History of bladder cancer  Cystoscopy Procedure Note  Patient identification was confirmed, informed consent was obtained, and patient was prepped using Betadine solution. Lidocaine jelly was administered per urethral meatus.   Preoperative abx where received prior to procedure.    Pre-Procedure: - Inspection reveals a normal caliber ureteral meatus.  Procedure: The flexible cystoscope was introduced without difficulty - No urethral strictures/lesions are present. - TURP defect noted of prostate, widely patent - Normal bladder neck - Bilateral ureteral orifices identified - Several stellate scar like lesions including on the posterior wall. No ulceration or calcifications appreciated today.  Otherwise no other lesions, tumors, or masses.  - No bladder stones - No trabeculation  Retroflexion unremarkable.   Post-Procedure: - Patient tolerated the procedure well   Post-Procedure: - Patient tolerated the procedure well

## 2016-05-25 ENCOUNTER — Other Ambulatory Visit (INDEPENDENT_AMBULATORY_CARE_PROVIDER_SITE_OTHER): Payer: Self-pay | Admitting: Vascular Surgery

## 2016-05-25 DIAGNOSIS — I824Z1 Acute embolism and thrombosis of unspecified deep veins of right distal lower extremity: Secondary | ICD-10-CM

## 2016-05-31 ENCOUNTER — Other Ambulatory Visit (INDEPENDENT_AMBULATORY_CARE_PROVIDER_SITE_OTHER): Payer: Self-pay | Admitting: Vascular Surgery

## 2016-05-31 DIAGNOSIS — I82491 Acute embolism and thrombosis of other specified deep vein of right lower extremity: Secondary | ICD-10-CM

## 2016-06-02 ENCOUNTER — Encounter (INDEPENDENT_AMBULATORY_CARE_PROVIDER_SITE_OTHER): Payer: Self-pay

## 2016-06-02 ENCOUNTER — Ambulatory Visit (INDEPENDENT_AMBULATORY_CARE_PROVIDER_SITE_OTHER): Payer: Self-pay | Admitting: Vascular Surgery

## 2016-07-02 ENCOUNTER — Ambulatory Visit (INDEPENDENT_AMBULATORY_CARE_PROVIDER_SITE_OTHER): Payer: Medicare Other | Admitting: Vascular Surgery

## 2016-07-02 ENCOUNTER — Encounter (INDEPENDENT_AMBULATORY_CARE_PROVIDER_SITE_OTHER): Payer: Self-pay | Admitting: Vascular Surgery

## 2016-07-02 ENCOUNTER — Ambulatory Visit (INDEPENDENT_AMBULATORY_CARE_PROVIDER_SITE_OTHER): Payer: Medicare Other

## 2016-07-02 VITALS — BP 199/111 | HR 58 | Resp 16 | Ht 72.0 in | Wt 204.0 lb

## 2016-07-02 DIAGNOSIS — C679 Malignant neoplasm of bladder, unspecified: Secondary | ICD-10-CM

## 2016-07-02 DIAGNOSIS — I82511 Chronic embolism and thrombosis of right femoral vein: Secondary | ICD-10-CM | POA: Diagnosis not present

## 2016-07-02 DIAGNOSIS — I82491 Acute embolism and thrombosis of other specified deep vein of right lower extremity: Secondary | ICD-10-CM | POA: Diagnosis not present

## 2016-07-02 DIAGNOSIS — I87009 Postthrombotic syndrome without complications of unspecified extremity: Secondary | ICD-10-CM

## 2016-07-02 DIAGNOSIS — E785 Hyperlipidemia, unspecified: Secondary | ICD-10-CM

## 2016-07-02 NOTE — Assessment & Plan Note (Signed)
lipid control important in reducing the progression of atherosclerotic disease. Continue statin therapy  

## 2016-07-02 NOTE — Assessment & Plan Note (Signed)
To have an [place] and see Urology next month

## 2016-07-02 NOTE — Assessment & Plan Note (Signed)
His postphlebitic symptoms have been well controlled with compression stockings which have markedly improved swelling. There is really no appreciable swelling today. He should continue to wear compression stockings, elevate his legs, and exercise as tolerated.

## 2016-07-02 NOTE — Assessment & Plan Note (Signed)
Duplex today shows a reasonably extensive but more chronic appearing DVT throughout the right lower extremity extending from the tibial veins to the femoral vein. He has been on 6 months of full anticoagulation. At this point, I think it is safe and reasonable to transition him off of full anticoagulation. I will start him on Plavix today and we'll continue this until his follow-up visit in 6 months.

## 2016-07-02 NOTE — Progress Notes (Signed)
MRN : [id number]  Cory Hardin is a 70 y.o. (1945/09/15) male who presents with chief complaint of  Chief Complaint  Patient presents with  . Re-evaluation    Ultrasound follow up  .  History of Present Illness: Patient returns today in follow up of DVT in the right lower extremity. He has done well and has been wearing compression stockings regularly which have markedly improved his swelling which is minimal at this point. His leg is not really hurting much at this point either. He has tolerated anticoagulation without bleeding issues. Duplex today shows a reasonably extensive but more chronic appearing DVT throughout the right lower extremity extending from the tibial veins to the femoral vein.   Current Outpatient Prescriptions  Medication Sig Dispense Refill  . ACCU-CHEK AVIVA PLUS test strip     . ACCU-CHEK SOFTCLIX LANCETS lancets     . carvedilol (COREG) 12.5 MG tablet Take 12.5 mg by mouth 2 (two) times daily with a meal.    . fenofibrate 160 MG tablet Take 160 mg by mouth daily.     Marland Kitchen glimepiride (AMARYL) 1 MG tablet Take 1 mg by mouth daily.     . hydrochlorothiazide (HYDRODIURIL) 25 MG tablet Take 1 tablet (25 mg total) by mouth daily. 30 tablet 6  . omeprazole (PRILOSEC) 20 MG capsule Take 20 mg by mouth daily.     . pravastatin (PRAVACHOL) 40 MG tablet Take 40 mg by mouth daily. pm    . XARELTO 20 MG TABS tablet      No current facility-administered medications for this visit.     Past Medical History:  Diagnosis Date  . Bilateral kidney stones   . BPH (benign prostatic hyperplasia)   . Cancer ([place])    bladder  . Cancer of lateral wall of urinary bladder ([place]) 12/05/2014  . Chronic kidney disease    stones,renal failure  . Diabetes mellitus without complication ([place])   . GERD (gastroesophageal reflux disease)   . Hematuria   . Hyperlipidemia   . Hypertension     Past Surgical History:  Procedure Laterality Date  . APPENDECTOMY    . bladder stones    .  CYSTOSCOPY    . CYSTOSCOPY WITH BIOPSY N/A 11/20/2014   Procedure: CYSTOSCOPY WITH BIOPSY;  Surgeon: [name], MD;  Location: [facility];  Service: Urology;  Laterality: N/A;  . CYSTOSCOPY WITH STENT PLACEMENT Right 06/23/2015   Procedure: CYSTOSCOPY WITH STENT PLACEMENT;  Surgeon: [name], MD;  Location: [facility];  Service: Urology;  Laterality: Right;  . PERCUTANEOUS NEPHROLITHOTRIPSY    . TRANSURETHRAL RESECTION OF BLADDER TUMOR N/A 06/23/2015   Procedure: TRANSURETHRAL RESECTION OF BLADDER TUMOR (TURBT);  Surgeon: [name], MD;  Location: [facility];  Service: Urology;  Laterality: N/A;  . TRANSURETHRAL RESECTION OF BLADDER TUMOR WITH GYRUS (TURBT-GYRUS)    . URETEROSCOPY WITH HOLMIUM LASER LITHOTRIPSY Right 06/23/2015   Procedure: URETEROSCOPY WITH HOLMIUM LASER LITHOTRIPSY;  Surgeon: [name], MD;  Location: [facility];  Service: Urology;  Laterality: Right;    Social History Social History  Substance Use Topics  . Smoking status: Former Smoker    Packs/day: 0.25    Years: 15.00    Types: Cigarettes    Quit date: 11/13/1982  . Smokeless tobacco: Never Used  . Alcohol use 0.6 oz/week    1 Standard drinks or equivalent per week     Comment: rare     Family History Family History  Problem Relation Age of  Onset  . Heart disease Father   . Heart disease Mother   . Cancer Maternal Aunt   . Heart disease Brother   . Kidney disease Neg Hx   . Prostate cancer Neg Hx      No Known Allergies   REVIEW OF SYSTEMS (Negative unless checked)  Constitutional: [] Weight loss  [] Fever  [] Chills Cardiac: [] Chest pain   [] Chest pressure   [] Palpitations   [] Shortness of breath when laying flat   [] Shortness of breath at rest   [] Shortness of breath with exertion. Vascular:  [] Pain in legs with walking   [] Pain in legs at rest   [] Pain in legs when laying flat   [] Claudication   [] Pain in feet when walking  [] Pain in feet at rest  [] Pain in feet when laying flat    [x] History of DVT   [] Phlebitis   [x] Swelling in legs   [] Varicose veins   [] Non-healing ulcers Pulmonary:   [] Uses home oxygen   [] Productive cough   [] Hemoptysis   [] Wheeze  [] COPD   [] Asthma Neurologic:  [] Dizziness  [] Blackouts   [] Seizures   [] History of stroke   [] History of TIA  [] Aphasia   [] Temporary blindness   [] Dysphagia   [] Weakness or numbness in arms   [] Weakness or numbness in legs Musculoskeletal:  [] Arthritis   [] Joint swelling   [] Joint pain   [] Low back pain Hematologic:  [] Easy bruising  [] Easy bleeding   [] Hypercoagulable state   [] Anemic   Gastrointestinal:  [] Blood in stool   [] Vomiting blood  [] Gastroesophageal reflux/heartburn   [] Abdominal pain Genitourinary:  [] Chronic kidney disease   [] Difficult urination  [] Frequent urination  [] Burning with urination   [] Hematuria  X Bladder tumor Skin:  [] Rashes   [] Ulcers   [] Wounds Psychological:  [] History of anxiety   []  History of major depression.  Physical Examination  BP (!) 199/111 (BP Location: Right Arm)   Pulse (!) 58   Resp 16   Ht 6' (1.829 m)   Wt 204 lb (92.5 kg)   BMI 27.67 kg/m  Gen:  WD/WN, NAD Head: [place]/AT, No temporalis wasting. Ear/Nose/Throat: Hearing grossly intact, nares w/o erythema or drainage, trachea midline Eyes: Conjunctiva clear. Sclera non-icteric Neck: Supple.  No JVD.  Pulmonary:  Good air movement, no use of accessory muscles.  Cardiac: RRR, normal S1, S2 Vascular:  Vessel Right Left  Radial Palpable Palpable  Ulnar Palpable Palpable  Brachial Palpable Palpable  Carotid Palpable, without bruit Palpable, without bruit  Aorta Not palpable N/A  Femoral Palpable Palpable  Popliteal Palpable Palpable  PT Palpable Palpable  DP Palpable Palpable   Gastrointestinal: soft, non-tender/non-distended. No guarding/reflex.  Musculoskeletal: M/S 5/5 throughout.  No deformity or atrophy. No appreciable edema today Neurologic: Sensation grossly intact in extremities.  Symmetrical.  Speech is  fluent.  Psychiatric: Judgment intact, Mood & affect appropriate for pt's clinical situation. Dermatologic: No rashes or ulcers noted.  No cellulitis or open wounds. Lymph : No Cervical, Axillary, or Inguinal lymphadenopathy.      Labs Recent Results (from the past 2160 hour(s))  PSA     Status: None   Collection Time: 04/19/16  8:51 AM  Result Value Ref Range   Prostate Specific Ag, Serum 2.9 0.0 - 4.0 ng/mL    Comment: Roche ECLIA methodology. According to the American Urological Association, Serum PSA should decrease and remain at undetectable levels after radical prostatectomy. The AUA defines biochemical recurrence as an initial PSA value 0.2 ng/mL or greater followed by a  subsequent confirmatory PSA value 0.2 ng/mL or greater. Values obtained with different assay methods or kits cannot be used interchangeably. Results cannot be interpreted as absolute evidence of the presence or absence of malignant disease.   Urinalysis, Complete     Status: Abnormal   Collection Time: 04/23/16  9:03 AM  Result Value Ref Range   Specific Gravity, UA 1.025 1.005 - 1.030   pH, UA 5.5 5.0 - 7.5   Color, UA Yellow Yellow   Appearance Ur Hazy (A) Clear   Leukocytes, UA Trace (A) Negative   Protein, UA Negative Negative/Trace   Glucose, UA Negative Negative   Ketones, UA Negative Negative   RBC, UA 2+ (A) Negative   Bilirubin, UA Negative Negative   Urobilinogen, Ur 0.2 0.2 - 1.0 mg/dL   Nitrite, UA Negative Negative   Microscopic Examination See below:   Microscopic Examination     Status: Abnormal   Collection Time: 04/23/16  9:03 AM  Result Value Ref Range   WBC, UA 6-10 (A) 0 - 5 /hpf   RBC, UA 11-30 (A) 0 - 2 /hpf   Epithelial Cells (non renal) 0-10 0 - 10 /hpf   Mucus, UA Present (A) Not Estab.   Bacteria, UA Few None seen/Few    Radiology No results found.    Assessment/Plan  Bladder cancer To have an [place] and see Urology next month  Hyperlipidemia lipid control  important in reducing the progression of atherosclerotic disease. Continue statin therapy   Chronic deep vein thrombosis (DVT) of femoral vein of right lower extremity (HCC) Duplex today shows a reasonably extensive but more chronic appearing DVT throughout the right lower extremity extending from the tibial veins to the femoral vein. He has been on 6 months of full anticoagulation. At this point, I think it is safe and reasonable to transition him off of full anticoagulation. I will start him on Plavix today and we'll continue this until his follow-up visit in 6 months.  Uncomplicated postphlebitic syndrome His postphlebitic symptoms have been well controlled with compression stockings which have markedly improved swelling. There is really no appreciable swelling today. He should continue to wear compression stockings, elevate his legs, and exercise as tolerated.    Leotis Pain, MD  07/02/2016 12:37 PM    This note was created with Dragon medical transcription system.  Any errors from dictation are purely unintentional

## 2016-07-21 ENCOUNTER — Other Ambulatory Visit: Payer: Medicare Other

## 2016-07-28 ENCOUNTER — Encounter: Payer: Self-pay | Admitting: Urology

## 2016-07-28 ENCOUNTER — Ambulatory Visit
Admission: RE | Admit: 2016-07-28 | Discharge: 2016-07-28 | Disposition: A | Discharge status: Home / Self Care | Payer: Medicare Other | Source: Ambulatory Visit | Attending: Urology | Admitting: Urology

## 2016-07-28 ENCOUNTER — Ambulatory Visit: Payer: Medicare Other | Admitting: Urology

## 2016-07-28 VITALS — BP 160/92 | HR 60 | Ht 72.0 in | Wt 207.0 lb

## 2016-07-28 DIAGNOSIS — N200 Calculus of kidney: Secondary | ICD-10-CM | POA: Insufficient documentation

## 2016-07-28 DIAGNOSIS — C679 Malignant neoplasm of bladder, unspecified: Secondary | ICD-10-CM | POA: Insufficient documentation

## 2016-07-28 LAB — URINALYSIS, COMPLETE
Bilirubin, UA: NEGATIVE
Glucose, UA: NEGATIVE
Ketones, UA: NEGATIVE
Leukocytes, UA: NEGATIVE
Nitrite, UA: NEGATIVE
Protein, UA: NEGATIVE
Specific Gravity, UA: 1.02 (ref 1.005–1.030)
Urobilinogen, Ur: 0.2 mg/dL (ref 0.2–1.0)
pH, UA: 6.5 (ref 5.0–7.5)

## 2016-07-28 LAB — MICROSCOPIC EXAMINATION: RBC, UA: >30 /hpf — AB (ref 0–?)

## 2016-07-28 MED ORDER — CIPROFLOXACIN HCL 500 MG PO TABS
500.0000 mg | ORAL_TABLET | Freq: Once | ORAL | Status: AC
  Administered 2016-07-28: 500 mg via ORAL

## 2016-07-28 MED ORDER — LIDOCAINE HCL 2 % EX GEL
1.0000 | Freq: Once | CUTANEOUS | Status: AC
Start: 2016-07-28 — End: 2016-07-28
  Administered 2016-07-28: 1 via URETHRAL

## 2016-07-28 NOTE — Progress Notes (Signed)
9:25 AM  07/28/16  Cory Hardin 07/05/1946 [id number]  Referring provider: Albina Billet, MD [address]   [place], [place] [address]   HPI: 72 yo M w/ recurrent nephrolithiasis, bladder stones, recurrent bladder CA, BPH s/p TURP, history of elevated PSA who returns for routine follow up/ cystoscopy.  Since last visit, he did have an episode of gross hematuria and RLQ pain which resolved spontaneously c/w with possible stone episode.    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 He has a recurrence and undenwent TURBT in 08/2014, pathology LgTA.  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  S/p BCG x 6 completed 02/2015.  Cysto 06/2015 suspicious for recurrent, TURBT c/w granulomatous inflammation/ chronic cystitis.    Cysto today fairly unremarkable.    Nephrolithiasis: He has an extensive history of recurrent stones. He is undergone multiple stone procedures in the past. 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   Most recent intervention s/p R URS, LL on 06/23/15 s/p URS.   Follow-up renal ultrasound from 09/2015 shows no hydronephrosis. Bilateral nonobstructing stones present.Marland Kitchen KUB 01/2016  with 1.6 cm LLP stone, increased from 1.3 6 months ago.  Right small stone stable.  KUB today, stable unchanged.  New gall stones appreciated.    BPH: History of BPH, bladder stones S/p TURP 04/2014.  Voiding well.    Elevated PSA: PSA elevated on 10/10/15 to 4.3,  3.3 7/17, and 2.9 on 10/217.  DRE 7/17 unremarkable.   PMH: Past Medical History:  Diagnosis Date  . Bilateral kidney stones   . BPH (benign prostatic hyperplasia)   . Cancer ([place])    bladder  . Cancer of lateral wall of urinary bladder ([place]) 12/05/2014  . Chronic kidney disease    stones,renal failure  . Diabetes mellitus without complication ([place])   . GERD  (gastroesophageal reflux disease)   . Hematuria   . Hyperlipidemia   . Hypertension     Surgical History: Past Surgical History:  Procedure Laterality Date  . APPENDECTOMY    . bladder stones    . CYSTOSCOPY    . CYSTOSCOPY WITH BIOPSY N/A 11/20/2014   Procedure: CYSTOSCOPY WITH BIOPSY;  Surgeon: [name], MD;  Location: [facility];  Service: Urology;  Laterality: N/A;  . CYSTOSCOPY WITH STENT PLACEMENT Right 06/23/2015   Procedure: CYSTOSCOPY WITH STENT PLACEMENT;  Surgeon: [name], MD;  Location: [facility];  Service: Urology;  Laterality: Right;  . PERCUTANEOUS NEPHROLITHOTRIPSY    . TRANSURETHRAL RESECTION OF BLADDER TUMOR N/A 06/23/2015   Procedure: TRANSURETHRAL RESECTION OF BLADDER TUMOR (TURBT);  Surgeon: [name], MD;  Location: [facility];  Service: Urology;  Laterality: N/A;  . TRANSURETHRAL RESECTION OF BLADDER TUMOR WITH GYRUS (TURBT-GYRUS)    . URETEROSCOPY WITH HOLMIUM LASER LITHOTRIPSY Right 06/23/2015   Procedure: URETEROSCOPY WITH HOLMIUM LASER LITHOTRIPSY;  Surgeon: [name], MD;  Location: [facility];  Service: Urology;  Laterality: Right;    Home Medications:  Allergies as of 07/28/2016   No Known Allergies     Medication List       Accurate as of 07/28/16  9:25 AM. Always use your most recent med list.          ACCU-CHEK AVIVA PLUS test strip Generic drug:  glucose blood   ACCU-CHEK SOFTCLIX LANCETS lancets   carvedilol 12.5 MG  tablet Commonly known as:  COREG Take 12.5 mg by mouth 2 (two) times daily with a meal.   clopidogrel 75 MG tablet Commonly known as:  PLAVIX   fenofibrate 160 MG tablet Take 160 mg by mouth daily.   glimepiride 1 MG tablet Commonly known as:  AMARYL Take 1 mg by mouth daily.   hydrochlorothiazide 25 MG tablet Commonly known as:  HYDRODIURIL Take 1 tablet (25 mg total) by mouth daily.   omeprazole 20 MG capsule Commonly known as:  PRILOSEC Take 20 mg by mouth daily.   pravastatin 40 MG  tablet Commonly known as:  PRAVACHOL Take 40 mg by mouth daily. pm       Allergies: No Known Allergies  Family History: Family History  Problem Relation Age of Onset  . Heart disease Father   . Heart disease Mother   . Cancer Maternal Aunt   . Heart disease Brother   . Kidney disease Neg Hx   . Prostate cancer Neg Hx     Social History:  reports that he quit smoking about 33 years ago. His smoking use included Cigarettes. He has a 3.75 pack-year smoking history. He has never used smokeless tobacco. He reports that he drinks about 0.6 oz of alcohol per week . He reports that he does not use drugs.  Physical Exam: BP (!) 160/92   Pulse 60   Ht 6' (1.829 m)   Wt 207 lb (93.9 kg)   BMI 28.07 kg/m   Constitutional:  Alert and oriented, No acute distress. HEENT: [place] AT, moist mucus membranes.  Trachea midline, no masses. Cardiovascular: No clubbing, cyanosis, or edema.  Respiratory: Normal respiratory effort, no increased work of breathing.  GI: Abdomen is soft, nontender, nondistended, no abdominal masses GU: No CVA tenderness.  Normal circumcised phallus. Skin: No rashes, bruises or suspicious lesions. Neurologic: Grossly intact, no focal deficits, moving all 4 extremities. Psychiatric: Normal mood and affect.  Laboratory Data: Lab Results  Component Value Date   WBC 9.8 02/09/2016   HGB 14.9 02/09/2016   HCT 41.7 02/09/2016   MCV 90.4 02/09/2016   PLT 133 (L) 02/09/2016    Lab Results  Component Value Date   CREATININE 1.49 (H) 02/09/2016    Component     Latest Ref Rng & Units 10/10/2015 01/16/2016 04/19/2016  PSA     0.0 - 4.0 ng/mL 4.3 (H) 3.3 2.9   Urinalysis Reviewed, no evidence of infection   Cystoscopy Procedure Note  Patient identification was confirmed, informed consent was obtained, and patient was prepped using Betadine solution. Lidocaine jelly was administered per urethral meatus.   Preoperative abx where received prior to procedure.    Pre-Procedure: - Inspection reveals a normal caliber ureteral meatus.  Procedure: The flexible cystoscope was introduced without difficulty - No urethral strictures/lesions are present. - TURP defect noted of prostate, widely patent - Normal bladder neck - Bilateral ureteral orifices identified - Several stellate scar like lesions including on the posterior wall and right lateral wall. No ulceration or calcifications appreciated today.  Otherwise no other lesions, tumors, or masses.  - No bladder stones - No trabeculation  Retroflexion unremarkable.   Post-Procedure: - Patient tolerated the procedure well   Post-Procedure: - Patient tolerated the procedure well  Assessment & Plan:    1.History of bladder cancer Recurrent low-grade Ta bladder cancer first diagnosed 03/07/2014 with recurrence LgTa 2/016. 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  He completed 6 week course of BCG  completed  02/2015.  Repeat TUR 06/2015 negative for malignancy. NED today on cysto   2. Elevated PSA Most recent PSA/rectal exam lower, non-concerning Will continue annual PSA screening,  due 04/2017  3. Calcium nephrolithiasis Status post multiple procedures including ureteroscopy 2, ESWL, and remote history of PCNL and most recent right URS, LL, stent on 06/2015.   Bilateral nephrolithiasis on RUS/ KUB today with slightly increased some burden over past 6 months No interested in intervention at this time, will repeat KUB in 6 months (07/2015) Call if develops flank pain sooner HCTZ 25mg  continue, consider referral to [facility] (Dr. Eulas Post Preminger) for further evaluation of active metabolic stone formation  4. BPH (benign prostatic hyperplasia) s/p TURP on 04/17/14. Pathology negative. Voiding well   F/u in 6 months for cystoscopy  [facility] [address] [place], [place] [address] [phone]

## 2016-08-02 ENCOUNTER — Other Ambulatory Visit: Payer: Self-pay | Admitting: Urology

## 2016-08-08 ENCOUNTER — Other Ambulatory Visit: Payer: Self-pay | Admitting: Urology

## 2016-08-08 DIAGNOSIS — C679 Malignant neoplasm of bladder, unspecified: Secondary | ICD-10-CM

## 2016-08-09 ENCOUNTER — Emergency Department: Payer: Medicare Other

## 2016-08-09 ENCOUNTER — Inpatient Hospital Stay
Admission: EM | Admit: 2016-08-09 | Discharge: 2016-08-13 | DRG: 166 | Disposition: A | Discharge status: Home / Self Care | Payer: Medicare Other | Attending: Internal Medicine | Admitting: Internal Medicine

## 2016-08-09 DIAGNOSIS — K219 Gastro-esophageal reflux disease without esophagitis: Secondary | ICD-10-CM | POA: Diagnosis present

## 2016-08-09 DIAGNOSIS — Z79899 Other long term (current) drug therapy: Secondary | ICD-10-CM | POA: Diagnosis not present

## 2016-08-09 DIAGNOSIS — N400 Enlarged prostate without lower urinary tract symptoms: Secondary | ICD-10-CM | POA: Diagnosis present

## 2016-08-09 DIAGNOSIS — Z8551 Personal history of malignant neoplasm of bladder: Secondary | ICD-10-CM | POA: Diagnosis not present

## 2016-08-09 DIAGNOSIS — E785 Hyperlipidemia, unspecified: Secondary | ICD-10-CM | POA: Diagnosis present

## 2016-08-09 DIAGNOSIS — M6281 Muscle weakness (generalized): Secondary | ICD-10-CM

## 2016-08-09 DIAGNOSIS — Z87891 Personal history of nicotine dependence: Secondary | ICD-10-CM

## 2016-08-09 DIAGNOSIS — Z7984 Long term (current) use of oral hypoglycemic drugs: Secondary | ICD-10-CM

## 2016-08-09 DIAGNOSIS — Z86718 Personal history of other venous thrombosis and embolism: Secondary | ICD-10-CM

## 2016-08-09 DIAGNOSIS — I129 Hypertensive chronic kidney disease with stage 1 through stage 4 chronic kidney disease, or unspecified chronic kidney disease: Secondary | ICD-10-CM | POA: Diagnosis present

## 2016-08-09 DIAGNOSIS — J449 Chronic obstructive pulmonary disease, unspecified: Secondary | ICD-10-CM | POA: Diagnosis present

## 2016-08-09 DIAGNOSIS — I82411 Acute embolism and thrombosis of right femoral vein: Secondary | ICD-10-CM | POA: Diagnosis present

## 2016-08-09 DIAGNOSIS — E1122 Type 2 diabetes mellitus with diabetic chronic kidney disease: Secondary | ICD-10-CM | POA: Diagnosis present

## 2016-08-09 DIAGNOSIS — R262 Difficulty in walking, not elsewhere classified: Secondary | ICD-10-CM

## 2016-08-09 DIAGNOSIS — R071 Chest pain on breathing: Secondary | ICD-10-CM | POA: Diagnosis not present

## 2016-08-09 DIAGNOSIS — E876 Hypokalemia: Secondary | ICD-10-CM | POA: Diagnosis not present

## 2016-08-09 DIAGNOSIS — I82401 Acute embolism and thrombosis of unspecified deep veins of right lower extremity: Secondary | ICD-10-CM | POA: Diagnosis not present

## 2016-08-09 DIAGNOSIS — J9601 Acute respiratory failure with hypoxia: Secondary | ICD-10-CM | POA: Diagnosis not present

## 2016-08-09 DIAGNOSIS — R0602 Shortness of breath: Secondary | ICD-10-CM | POA: Diagnosis present

## 2016-08-09 DIAGNOSIS — I2699 Other pulmonary embolism without acute cor pulmonale: Principal | ICD-10-CM | POA: Diagnosis present

## 2016-08-09 DIAGNOSIS — N183 Chronic kidney disease, stage 3 (moderate): Secondary | ICD-10-CM | POA: Diagnosis present

## 2016-08-09 DIAGNOSIS — R05 Cough: Secondary | ICD-10-CM | POA: Diagnosis not present

## 2016-08-09 DIAGNOSIS — Z7902 Long term (current) use of antithrombotics/antiplatelets: Secondary | ICD-10-CM

## 2016-08-09 HISTORY — DX: Acute embolism and thrombosis of unspecified deep veins of unspecified lower extremity: I82.409

## 2016-08-09 LAB — PROTIME-INR
INR: 1.51
Prothrombin Time: 18.4 seconds — ABNORMAL HIGH (ref 11.4–15.2)

## 2016-08-09 LAB — COMPREHENSIVE METABOLIC PANEL
ALT: 23 U/L (ref 17–63)
AST: 31 U/L (ref 15–41)
Albumin: 3.8 g/dL (ref 3.5–5.0)
Alkaline Phosphatase: 84 U/L (ref 38–126)
Anion gap: 8 (ref 5–15)
BUN: 31 mg/dL — ABNORMAL HIGH (ref 6–20)
CO2: 29 mmol/L (ref 22–32)
Calcium: 9.3 mg/dL (ref 8.9–10.3)
Chloride: 101 mmol/L (ref 101–111)
Creatinine, Ser: 1.61 mg/dL — ABNORMAL HIGH (ref 0.61–1.24)
GFR calc Af Amer: 48 mL/min — ABNORMAL LOW (ref >60)
GFR calc non Af Amer: 41 mL/min — ABNORMAL LOW (ref >60)
Glucose, Bld: 144 mg/dL — ABNORMAL HIGH (ref 65–99)
Potassium: 4.4 mmol/L (ref 3.5–5.1)
Sodium: 138 mmol/L (ref 135–145)
Total Bilirubin: 1.1 mg/dL (ref 0.3–1.2)
Total Protein: 8.5 g/dL — ABNORMAL HIGH (ref 6.5–8.1)

## 2016-08-09 LAB — CBC WITH DIFFERENTIAL/PLATELET
Basophils Absolute: 0.1 10*3/uL (ref 0–0.1)
Basophils Relative: 1 %
Eosinophils Absolute: 0.2 10*3/uL (ref 0–0.7)
Eosinophils Relative: 2 %
HCT: 41.2 % (ref 40.0–52.0)
Hemoglobin: 14.6 g/dL (ref 13.0–18.0)
Lymphocytes Relative: 9 %
Lymphs Abs: 1 10*3/uL (ref 1.0–3.6)
MCH: 32.5 pg (ref 26.0–34.0)
MCHC: 35.6 g/dL (ref 32.0–36.0)
MCV: 91.2 fL (ref 80.0–100.0)
Monocytes Absolute: 1.4 10*3/uL — ABNORMAL HIGH (ref 0.2–1.0)
Monocytes Relative: 12 %
Neutro Abs: 8.6 10*3/uL — ABNORMAL HIGH (ref 1.4–6.5)
Neutrophils Relative %: 76 %
Platelets: 239 10*3/uL (ref 150–440)
RBC: 4.51 MIL/uL (ref 4.40–5.90)
RDW: 13.2 % (ref 11.5–14.5)
WBC: 11.2 10*3/uL — ABNORMAL HIGH (ref 3.8–10.6)

## 2016-08-09 LAB — LACTIC ACID, PLASMA
Lactic Acid, Venous: 2 mmol/L (ref 0.5–1.9)
Lactic Acid, Venous: 0.9 mmol/L (ref 0.5–1.9)

## 2016-08-09 LAB — BRAIN NATRIURETIC PEPTIDE: B Natriuretic Peptide: 103 pg/mL — ABNORMAL HIGH (ref 0.0–100.0)

## 2016-08-09 LAB — APTT: aPTT: 32 seconds (ref 24–36)

## 2016-08-09 LAB — TROPONIN I
Troponin I: <0.03 ng/mL (ref <0.03)
Troponin I: <0.03 ng/mL (ref <0.03)

## 2016-08-09 MED ORDER — HEPARIN BOLUS VIA INFUSION
4000.0000 [IU] | Freq: Once | INTRAVENOUS | Status: AC
  Administered 2016-08-09: 4000 [IU] via INTRAVENOUS
  Filled 2016-08-09: qty 4000

## 2016-08-09 MED ORDER — ALBUTEROL SULFATE (2.5 MG/3ML) 0.083% IN NEBU
INHALATION_SOLUTION | RESPIRATORY_TRACT | Status: AC
  Filled 2016-08-09: qty 3

## 2016-08-09 MED ORDER — CARVEDILOL 12.5 MG PO TABS
12.5000 mg | ORAL_TABLET | Freq: Two times a day (BID) | ORAL | Status: DC
  Administered 2016-08-10 – 2016-08-12 (×3): 12.5 mg via ORAL
  Filled 2016-08-09 (×4): qty 1

## 2016-08-09 MED ORDER — HEPARIN (PORCINE) IN NACL 100-0.45 UNIT/ML-% IJ SOLN
1600.0000 [IU]/h | INTRAMUSCULAR | Status: AC
  Administered 2016-08-09: 1400 [IU]/h via INTRAVENOUS
  Administered 2016-08-10: 1550 [IU]/h via INTRAVENOUS
  Administered 2016-08-10 – 2016-08-12 (×3): 1600 [IU]/h via INTRAVENOUS
  Filled 2016-08-09 (×8): qty 250

## 2016-08-09 MED ORDER — PANTOPRAZOLE SODIUM 40 MG PO TBEC
40.0000 mg | DELAYED_RELEASE_TABLET | Freq: Every day | ORAL | Status: DC
  Administered 2016-08-10 – 2016-08-13 (×3): 40 mg via ORAL
  Filled 2016-08-09 (×3): qty 1

## 2016-08-09 MED ORDER — IOPAMIDOL (ISOVUE-370) INJECTION 76%
60.0000 mL | Freq: Once | INTRAVENOUS | Status: AC | PRN
  Administered 2016-08-09: 60 mL via INTRAVENOUS

## 2016-08-09 MED ORDER — SODIUM CHLORIDE 0.9 % IV SOLN
INTRAVENOUS | Status: DC
  Administered 2016-08-11 (×2): via INTRAVENOUS

## 2016-08-09 MED ORDER — INSULIN ASPART 100 UNIT/ML ~~LOC~~ SOLN
0.0000 [IU] | Freq: Three times a day (TID) | SUBCUTANEOUS | Status: DC
Start: 2016-08-10 — End: 2016-08-13
  Administered 2016-08-10: 1 [IU] via SUBCUTANEOUS
  Administered 2016-08-10: 2 [IU] via SUBCUTANEOUS
  Administered 2016-08-12: 1 [IU] via SUBCUTANEOUS
  Administered 2016-08-12: 3 [IU] via SUBCUTANEOUS
  Filled 2016-08-09: qty 2
  Filled 2016-08-09: qty 3
  Filled 2016-08-09 (×2): qty 1

## 2016-08-09 MED ORDER — HYDROCHLOROTHIAZIDE 25 MG PO TABS
25.0000 mg | ORAL_TABLET | Freq: Every day | ORAL | Status: DC
Start: 2016-08-10 — End: 2016-08-13
  Administered 2016-08-10 – 2016-08-13 (×3): 25 mg via ORAL
  Filled 2016-08-09 (×3): qty 1

## 2016-08-09 MED ORDER — ACETAMINOPHEN 325 MG PO TABS
650.0000 mg | ORAL_TABLET | Freq: Four times a day (QID) | ORAL | Status: DC | PRN
  Administered 2016-08-10: 650 mg via ORAL
  Filled 2016-08-09: qty 2

## 2016-08-09 MED ORDER — INSULIN ASPART 100 UNIT/ML ~~LOC~~ SOLN: 0.0000 [IU] | Freq: Every day | SUBCUTANEOUS | Status: DC

## 2016-08-09 MED ORDER — SODIUM CHLORIDE 0.9 % IV SOLN
Freq: Once | INTRAVENOUS | Status: AC
  Administered 2016-08-09: 19:00:00 via INTRAVENOUS

## 2016-08-09 MED ORDER — FENOFIBRATE 160 MG PO TABS
160.0000 mg | ORAL_TABLET | Freq: Every day | ORAL | Status: DC
Start: 2016-08-10 — End: 2016-08-13
  Administered 2016-08-10 – 2016-08-13 (×3): 160 mg via ORAL
  Filled 2016-08-09 (×3): qty 1

## 2016-08-09 MED ORDER — PRAVASTATIN SODIUM 40 MG PO TABS
40.0000 mg | ORAL_TABLET | Freq: Every day | ORAL | Status: DC
  Administered 2016-08-10 – 2016-08-13 (×3): 40 mg via ORAL
  Filled 2016-08-09 (×3): qty 1

## 2016-08-09 MED ORDER — ACETAMINOPHEN 650 MG RE SUPP: 650.0000 mg | Freq: Four times a day (QID) | RECTAL | Status: DC | PRN

## 2016-08-09 MED ORDER — ALBUTEROL SULFATE (2.5 MG/3ML) 0.083% IN NEBU
5.0000 mg | INHALATION_SOLUTION | Freq: Once | RESPIRATORY_TRACT | Status: AC
  Administered 2016-08-09: 5 mg via RESPIRATORY_TRACT

## 2016-08-09 MED ORDER — GLIMEPIRIDE 2 MG PO TABS
1.0000 mg | ORAL_TABLET | Freq: Every day | ORAL | Status: DC
  Administered 2016-08-10: 1 mg via ORAL
  Filled 2016-08-09: qty 1

## 2016-08-09 NOTE — H&P (Signed)
[facility] NAME: Cory Hardin    MR#:  [id number]  DATE OF BIRTH:  [birth year]  DATE OF ADMISSION:  08/09/2016  PRIMARY CARE PHYSICIAN: Albina Billet, MD   REQUESTING/REFERRING PHYSICIAN: Dr Conni Slipper  CHIEF COMPLAINT:   Chief Complaint  Patient presents with  . Cough  . Shortness of Breath    HISTORY OF PRESENT ILLNESS:  Cory Hardin  is a 71 y.o. male with a known history of DVT in the past. He developed a cough several weeks ago and he went to his primary care physician and was given an antibiotic and things seemed to get better for a period of time then the following Wednesday he developed a bad cough again and his right side started getting sore and he couldn't get comfortable. Then he started getting short of breath. Now he can hardly talk without being short of breath. He is taking short shallow breaths. In the ER he was found to have a pulmonary embolism on CT scan. Hospitalist services were contacted for further evaluation.  PAST MEDICAL HISTORY:   Past Medical History:  Diagnosis Date  . Bilateral kidney stones   . BPH (benign prostatic hyperplasia)   . Cancer ([place])    bladder  . Cancer of lateral wall of urinary bladder ([place]) 12/05/2014  . Chronic kidney disease    stones,renal failure  . Diabetes mellitus without complication ([place])   . GERD (gastroesophageal reflux disease)   . Hematuria   . Hyperlipidemia   . Hypertension     PAST SURGICAL HISTORY:   Past Surgical History:  Procedure Laterality Date  . APPENDECTOMY    . bladder stones    . CYSTOSCOPY    . CYSTOSCOPY WITH BIOPSY N/A 11/20/2014   Procedure: CYSTOSCOPY WITH BIOPSY;  Surgeon: [name], MD;  Location: [facility];  Service: Urology;  Laterality: N/A;  . CYSTOSCOPY WITH STENT PLACEMENT Right 06/23/2015   Procedure: CYSTOSCOPY WITH STENT PLACEMENT;  Surgeon: [name], MD;  Location: [facility];  Service: Urology;  Laterality: Right;   . PERCUTANEOUS NEPHROLITHOTRIPSY    . TRANSURETHRAL RESECTION OF BLADDER TUMOR N/A 06/23/2015   Procedure: TRANSURETHRAL RESECTION OF BLADDER TUMOR (TURBT);  Surgeon: [name], MD;  Location: [facility];  Service: Urology;  Laterality: N/A;  . TRANSURETHRAL RESECTION OF BLADDER TUMOR WITH GYRUS (TURBT-GYRUS)    . URETEROSCOPY WITH HOLMIUM LASER LITHOTRIPSY Right 06/23/2015   Procedure: URETEROSCOPY WITH HOLMIUM LASER LITHOTRIPSY;  Surgeon: [name], MD;  Location: [facility];  Service: Urology;  Laterality: Right;    SOCIAL HISTORY:   Social History  Substance Use Topics  . Smoking status: Former Smoker    Packs/day: 0.25    Years: 15.00    Types: Cigarettes    Quit date: 11/13/1982  . Smokeless tobacco: Never Used  . Alcohol use 0.6 oz/week    1 Standard drinks or equivalent per week     Comment: rare    FAMILY HISTORY:   Family History  Problem Relation Age of Onset  . Heart disease Father   . Heart disease Mother   . Heart failure Mother   . COPD Mother   . Cancer Maternal Aunt   . Heart disease Brother   . Diabetes Brother   . Kidney disease Neg Hx   . Prostate cancer Neg Hx     DRUG ALLERGIES:  No Known Allergies  REVIEW OF SYSTEMS:  CONSTITUTIONAL: No fever, Positive for fatigue.  EYES:  No blurred or double vision. Wears glasses EARS, NOSE, AND THROAT: No tinnitus or ear pain. No sore throat. Decreased hearing RESPIRATORY: Positive for cough and shortness of breath. No wheezing or hemoptysis.  CARDIOVASCULAR: Right-sided chest pain. No orthopnea, edema.  GASTROINTESTINAL: No nausea, vomiting, diarrhea or abdominal pain. No blood in bowel movements. Positive for constipation. GENITOURINARY: Positive for dysuria. No hematuria.  ENDOCRINE: No polyuria, nocturia,  HEMATOLOGY: No anemia, easy bruising or bleeding SKIN: No rash or lesion. MUSCULOSKELETAL: No joint pain or arthritis.   NEUROLOGIC: No tingling, numbness, weakness.  PSYCHIATRY: No anxiety  or depression.   MEDICATIONS AT HOME:   Prior to Admission medications   Medication Sig Start Date End Date Taking? Authorizing Provider  carvedilol (COREG) 12.5 MG tablet Take 12.5 mg by mouth 2 (two) times daily with a meal.   Yes Historical Provider, MD  clopidogrel (PLAVIX) 75 MG tablet Take 75 mg by mouth daily.  07/07/16  Yes Historical Provider, MD  fenofibrate 160 MG tablet Take 160 mg by mouth daily.  10/08/15  Yes Historical Provider, MD  glimepiride (AMARYL) 1 MG tablet Take 1 mg by mouth daily.  05/19/15  Yes Historical Provider, MD  hydrochlorothiazide (HYDRODIURIL) 25 MG tablet TAKE ONE TABLET BY MOUTH ONCE DAILY 08/09/16  Yes [name], MD  omeprazole (PRILOSEC) 20 MG capsule Take 20 mg by mouth daily.  05/19/15  Yes Historical Provider, MD  pravastatin (PRAVACHOL) 40 MG tablet Take 40 mg by mouth daily. pm   Yes Historical Provider, MD  ACCU-CHEK AVIVA PLUS test strip  08/22/15   Historical Provider, MD  ACCU-CHEK SOFTCLIX LANCETS lancets  09/22/15   Historical Provider, MD      VITAL SIGNS:  Blood pressure (!) 143/83, pulse 72, temperature 98.2 F (36.8 C), temperature source Oral, resp. rate (!) 28, height 6' (1.829 m), weight 87.1 kg (192 lb), SpO2 93 %.  PHYSICAL EXAMINATION:  GENERAL:  71 y.o.-year-old patient lying in the bed with Shallow breathing.  EYES: Pupils equal, round, reactive to light and accommodation. No scleral icterus. Extraocular muscles intact.  HEENT: Head atraumatic, normocephalic. Oropharynx and nasopharynx clear.  NECK:  Supple, no jugular venous distention. No thyroid enlargement, no tenderness.  LUNGS: Decreased breath sounds bilaterally, no wheezing, rales,rhonchi or crepitation. No use of accessory muscles of respiration.  CARDIOVASCULAR: S1, S2 normal. No murmurs, rubs, or gallops.  ABDOMEN: Soft, nontender, nondistended. Bowel sounds present. No organomegaly or mass.  EXTREMITIES: No pedal edema, cyanosis, or clubbing.  NEUROLOGIC: Cranial  nerves II through XII are intact. Muscle strength 5/5 in all extremities. Sensation intact. Gait not checked.  PSYCHIATRIC: The patient is alert and oriented x 3.  SKIN: No rash, lesion, or ulcer.   LABORATORY PANEL:   CBC  Recent Labs Lab 08/09/16 1432  WBC 11.2*  HGB 14.6  HCT 41.2  PLT 239   ------------------------------------------------------------------------------------------------------------------  Chemistries   Recent Labs Lab 08/09/16 1432  NA 138  K 4.4  CL 101  CO2 29  GLUCOSE 144*  BUN 31*  CREATININE 1.61*  CALCIUM 9.3  AST 31  ALT 23  ALKPHOS 84  BILITOT 1.1   ------------------------------------------------------------------------------------------------------------------  Cardiac Enzymes  Recent Labs Lab 08/09/16 1843  TROPONINI <0.03   ------------------------------------------------------------------------------------------------------------------  RADIOLOGY:  Dg Chest 2 View  Result Date: 08/09/2016 CLINICAL DATA:  Sick with cough and congestion EXAM: CHEST  2 VIEW COMPARISON:  05/23/2001 CXR report FINDINGS: Heart is top normal. No aortic aneurysm. There is mild aortic atherosclerosis. Low lung volumes  with slight elevation of right hemidiaphragm. Bibasilar atelectasis and/or scarring is noted. Trace fluid within the right major fissure. No pulmonary consolidation or CHF. No acute osseous abnormality. Mild degenerative changes along the dorsal spine. IMPRESSION: No active cardiopulmonary disease. Bibasilar atelectasis and/or scarring. Trace fluid along the right major fissure. Electronically Signed   By: Ashley Royalty M.D.   On: 08/09/2016 15:23   Ct Angio Chest Pe W And/or Wo Contrast  Result Date: 08/09/2016 CLINICAL DATA:  Severe right chest pain, back cough EXAM: CT ANGIOGRAPHY CHEST WITH CONTRAST TECHNIQUE: Multidetector CT imaging of the chest was performed using the standard protocol during bolus administration of intravenous contrast.  Multiplanar CT image reconstructions and MIPs were obtained to evaluate the vascular anatomy. CONTRAST:  60 mL Isovue 370 COMPARISON:  None. FINDINGS: Cardiovascular: Satisfactory opacification of the pulmonary arteries to the segmental level. Acute pulmonary embolus in the lobar, segmental and subsegmental branches of the right lower lobe. Normal heart size. No pericardial effusion. Mediastinum/Nodes: No enlarged mediastinal, hilar, or axillary lymph nodes. Thyroid gland, trachea, and esophagus demonstrate no significant findings. Lungs/Pleura: Small right pleural effusion. Right lower lobe airspace disease likely reflecting atelectasis versus infarct. Left lower lobe airspace disease likely reflecting atelectasis. Upper Abdomen: No acute upper abdominal abnormality. Cholelithiasis. Small hiatal hernia. Musculoskeletal: No acute osseous abnormality. No lytic or sclerotic osseous lesion. Review of the MIP images confirms the above findings. IMPRESSION: 1. Acute pulmonary embolus involving the lobar, segmental and subsegmental branches of the right lower lobe. Positive for acute PE with CT evidence of right heart strain (RV/LV Ratio = 1.2) consistent with at least submassive (intermediate risk) PE. The presence of right heart strain has been associated with an increased risk of morbidity and mortality. Please activate Code PE by paging [phone]. 2. Small right pleural effusion. Right lower lobe airspace disease likely reflecting atelectasis versus infarct. 3. Cholelithiasis. Critical Value/emergent results were called by telephone at the time of interpretation on 08/09/2016 at 7:26 pm to Dr. Conni Slipper , who verbally acknowledged these results. Electronically Signed   By: Kathreen Devoid   On: 08/09/2016 19:26    EKG:   Normal sinus rhythm 70 bpm, LVH  IMPRESSION AND PLAN:   1. Acute submassive pulmonary embolism with possible pulmonary infarct. I ordered heparin drip. We'll get a sonogram of the lower  extremities to rule out DVT since he does have a history of DVT in the past. He will be on lifelong anticoagulation if he can tolerate it. I will also get an echocardiogram. 2. Type 2 diabetes mellitus continue Amaryl and put on sliding Scale 3. Chronic kidney disease stage III. Monitor after IV contrast given with CT scan 4. Essential hypertension continue usual medications 5. History of bladder cancer 6. History of nephrolithiasis  All the records are reviewed and case discussed with ED provider. Management plans discussed with the patient, family and they are in agreement.  CODE STATUS: Full code  TOTAL TIME TAKING CARE OF THIS PATIENT: 50 minutes.    Loletha Grayer M.D on 08/09/2016 at 8:47 PM  Between 7am to 6pm - Pager - [phone]  After 6pm call admission pager [phone]  [facility]  [phone]  CC: Primary care physician; Albina Billet, MD

## 2016-08-09 NOTE — ED Triage Notes (Signed)
Pt has been sick with cough and congestion for over 3 weeks - he has been PCP and obtained atb 3 weeks ago and cough seemed to improve but over the last week the cough has become productive and causing shortness of breath with pain to right side

## 2016-08-09 NOTE — Progress Notes (Signed)
ANTICOAGULATION CONSULT NOTE - Initial Consult  Pharmacy Consult for Heparin Drip  Indication: pulmonary embolus  No Known Allergies  Patient Measurements: Height: 6' (182.9 cm) Weight: 192 lb (87.1 kg) IBW/kg (Calculated) : 77.6  Vital Signs: Temp: 98.2 F (36.8 C) (02/05 1452) Temp Source: Oral (02/05 1452) BP: 143/83 (02/05 1846) Pulse Rate: 72 (02/05 1846)  Labs:  Recent Labs  08/09/16 1432 08/09/16 1843  HGB 14.6  --   HCT 41.2  --   PLT 239  --   CREATININE 1.61*  --   TROPONINI <0.03 <0.03    Estimated Creatinine Clearance: 46.2 mL/min (by C-G formula based on SCr of 1.61 mg/dL (H)).   Medical History: Past Medical History:  Diagnosis Date  . Bilateral kidney stones   . BPH (benign prostatic hyperplasia)   . Cancer ([place])    bladder  . Cancer of lateral wall of urinary bladder ([place]) 12/05/2014  . Chronic kidney disease    stones,renal failure  . Diabetes mellitus without complication ([place])   . GERD (gastroesophageal reflux disease)   . Hematuria   . Hyperlipidemia   . Hypertension    Assessment: 71 yo male with PMH of bladder cancer found to have PE. Pharmacy consulted for heparin dosing and monitoring.   Goal of Therapy:  Heparin level 0.3-0.7 units/ml Monitor platelets by anticoagulation protocol: Yes   Plan:  Baseline labs ordered.  Give 4000 units bolus x 1 Start heparin infusion at 1400 units/hr Check anti-Xa level in 8 hours and daily while on heparin Continue to monitor H&H and platelets  Pernell Dupre, PharmD, BCPS Clinical Pharmacist 08/09/2016 8:20 PM

## 2016-08-09 NOTE — Progress Notes (Signed)
Patient ID: Cory Hardin, male   DOB: 1945-07-22, 71 y.o.   MRN: [id number] Reviewed chart. Saw no blood thinner ordered.  I ordered heparin drip and spoke with the pharmacist to get things going. Now I will go evaluate the patient for admission.  Dr. Leslye Peer

## 2016-08-09 NOTE — ED Notes (Signed)
Unable to access sunquest for this pt, lab called twice due to in process error message. Emergent heparin to be administered. Tubes sent w/ chart labels, lab notifed both calls.

## 2016-08-09 NOTE — ED Provider Notes (Signed)
[facility] Emergency Department Provider No ____________________________________________   First MD Initiated Contact with Patient 08/09/16 1808     (approximate)  I have reviewed the triage vital signs and the nursing notes.   HISTORY  Chief Complaint Cough and Shortness of Breath PI Cory Hardin is a 71 y.o. male patient complains of cough coughing congestion for about 3 weeks. That's of antibiotics, seemed better but now is getting worse again. He is not coughing anything up he does not have a fever me as having pain in his right chest especially when he coughs or takes a deep breath. Pain is sharp stabbing moderate in nature patient reports the pain is making it difficult for him to get enough air.   Past Medical History:  Diagnosis Date  . Bilateral kidney stones   . BPH (benign prostatic hyperplasia)   . Cancer ([place])    bladder  . 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 Chronic kidney disease    stones,renal failure  . Diabetes mellitus without complication ([place])   . GERD (gastroesophageal reflux disease)   . Hematuria   . Hyperlipidemia   . Hypertension     Patient Active Problem List   Diagnosis Date Noted  . Hyperlipidemia 07/02/2016  . Chronic deep vein thrombosis (DVT) of femoral vein of right lower extremity ([place]) 07/02/2016  . 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 Bladder cancer ([place]) 01/28/2015  . 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 BPH (benign prostatic hyperplasia) 12/05/2014    Past Surgical History:  Procedure Laterality Date  . APPENDECTOMY    . bladder stones    . CYSTOSCOPY    . CYSTOSCOPY WITH BIOPSY N/A 11/20/2014   Procedure: CYSTOSCOPY WITH BIOPSY;  Surgeon: [name], MD;  Location: [facility] ORS;  Service: Urology;  Laterality: N/A;  . CYSTOSCOPY WITH STENT PLACEMENT Right 06/23/2015   Procedure: CYSTOSCOPY WITH STENT  PLACEMENT;  Surgeon: [name], MD;  Location: [facility] ORS;  Service: Urology;  Laterality: Right;  . PERCUTANEOUS NEPHROLITHOTRIPSY    . TRANSURETHRAL RESECTION OF BLADDER TUMOR N/A 06/23/2015   Procedure: TRANSURETHRAL RESECTION OF BLADDER TUMOR (TURBT);  Surgeon: [name], MD;  Location: [facility] ORS;  Service: Urology;  Laterality: N/A;  . TRANSURETHRAL RESECTION OF BLADDER TUMOR WITH GYRUS (TURBT-GYRUS)    . URETEROSCOPY WITH HOLMIUM LASER LITHOTRIPSY Right 06/23/2015   Procedure: URETEROSCOPY WITH HOLMIUM LASER LITHOTRIPSY;  Surgeon: [name], MD;  Location: [facility] ORS;  Service: Urology;  Laterality: Right;    Prior to Admission medications   Medication Sig Start Date End Date Taking? Authorizing Provider  carvedilol (COREG) 12.5 MG tablet Take 12.5 mg by mouth 2 (two) times daily with a meal.   Yes Historical Provider, MD  clopidogrel (PLAVIX) 75 MG tablet Take 75 mg by mouth daily.  07/07/16  Yes Historical Provider, MD  fenofibrate 160 MG tablet Take 160 mg by mouth daily.  10/08/15  Yes Historical Provider, MD  glimepiride (AMARYL) 1 MG tablet Take 1 mg by mouth daily.  05/19/15  Yes Historical Provider, MD  hydrochlorothiazide (HYDRODIURIL) 25 MG tablet TAKE ONE TABLET BY MOUTH ONCE DAILY 08/09/16  Yes [name], MD  omeprazole (PRILOSEC) 20 MG capsule Take 20 mg by mouth daily.  05/19/15  Yes Historical Provider, MD  pravastatin (PRAVACHOL) 40 MG tablet Take 40 mg by mouth daily. pm   Yes Historical Provider, MD  ACCU-CHEK AVIVA PLUS test  strip  08/22/15   Historical Provider, MD  ACCU-CHEK SOFTCLIX LANCETS lancets  09/22/15   Historical Provider, MD    Allergies Patient has no known allergies.  Family History  Problem Relation Age of Onset  . Heart disease Father   . Heart disease Mother   . Cancer Maternal Aunt   . Heart disease Brother   . Kidney disease Neg Hx   . Prostate cancer Neg Hx     Social History Social History  Substance Use Topics  . Smoking  status: Former Smoker    Packs/day: 0.25    Years: 15.00    Types: Cigarettes    Quit date: 11/13/1982  . Smokeless tobacco: Never Used  . Alcohol use 0.6 oz/week    1 Standard drinks or equivalent per week     Comment: rare    Review of Systems Constitutional: No fever/chills Eyes: No visual changes. ENT: No sore throat. Cardiovascular: See history of present illness Respiratory: See history of present illness Gastrointestinal: No abdominal pain.  No nausea, no vomiting.  No diarrhea.  No constipation. Genitourinary: Negative for dysuria. Musculoskeletal: Negative for back pain. Skin: Negative for rash. Neurological: Negative for headaches, focal weakness or numbness.  10-point ROS otherwise negative.  ____________________________________________   PHYSICAL EXAM:  VITAL SIGNS: ED Triage Vitals  Enc Vitals Group     BP 08/09/16 1452 132/82     Pulse Rate 08/09/16 1452 73     Resp 08/09/16 1452 (!) 22     Temp 08/09/16 1452 98.2 F (36.8 C)     Temp Source 08/09/16 1452 Oral     SpO2 08/09/16 1452 91 %     Weight 08/09/16 1428 192 lb (87.1 kg)     Height 08/09/16 1428 6' (1.829 m)     Head Circumference --      Peak Flow --      Pain Score 08/09/16 1428 10     Pain Loc --      Pain Edu? --      Excl. in [place]? --     Constitutional: Alert and oriented. Well appearing and in no acute distress. Eyes: Conjunctivae are normal. PERRL. EOMI. Head: Atraumatic. Nose: No congestion/rhinnorhea. Mouth/Throat: Mucous membranes are moist.  Oropharynx non-erythematous. Neck: No stridor.  Cardiovascular: Normal rate, regular rhythm. Grossly normal heart sounds.  Good peripheral circulation.No chest wall tenderness Respiratory: Normal respiratory effort.  No retractions. Lungs CTAB. Gastrointestinal: Soft and nontender. No distention. No abdominal bruits. No CVA tenderness. Musculoskeletal: No lower extremity tenderness nor edema.  No joint effusions. Neurologic:  Normal speech  and language. No gross focal neurologic deficits are appreciated. No gait instability. Skin:  Skin is warm, dry and intact. No rash noted.   ____________________________________________   LABS (all labs ordered are listed, but only abnormal results are displayed)  Labs Reviewed  CBC WITH DIFFERENTIAL/PLATELET - Abnormal; Notable for the following:       Result Value   WBC 11.2 (*)    Neutro Abs 8.6 (*)    Monocytes Absolute 1.4 (*)    All other components within normal limits  COMPREHENSIVE METABOLIC PANEL - Abnormal; Notable for the following:    Glucose, Bld 144 (*)    BUN 31 (*)    Creatinine, Ser 1.61 (*)    Total Protein 8.5 (*)    GFR calc non Af Amer 41 (*)    GFR calc Af Amer 48 (*)    All other components within normal limits  LACTIC ACID, PLASMA - Abnormal; Notable for the following:    Lactic Acid, Venous 2.0 (*)    All other components within normal limits  BRAIN NATRIURETIC PEPTIDE - Abnormal; Notable for the following:    B Natriuretic Peptide 103.0 (*)    All other components within normal limits  TROPONIN I  TROPONIN I  LACTIC ACID, PLASMA   ____________________________________________  EKG  EKG read and interpreted by me shows normal sinus rhythm rate of 78 left axis no acute ST-T wave changes ____________________________________________  RADIOLOGY  Study Result   CLINICAL DATA:  Sick with cough and congestion  EXAM: CHEST  2 VIEW  COMPARISON:  05/23/2001 CXR report  FINDINGS: Heart is top normal. No aortic aneurysm. There is mild aortic atherosclerosis. Low lung volumes with slight elevation of right hemidiaphragm. Bibasilar atelectasis and/or scarring is noted. Trace fluid within the right major fissure. No pulmonary consolidation or CHF. No acute osseous abnormality. Mild degenerative changes along the dorsal spine.  IMPRESSION: No active cardiopulmonary disease. Bibasilar atelectasis and/or scarring. Trace fluid along the right  major fissure.   Electronically Signed   By: Ashley Royalty M.D.   On: 08/09/2016 15:23    These changes do not appear to have been present on the abdominal films done last month on the 24th. At that point the diaphragms appeared to be clear _________________________________________  Radiology calls and reports the chest CT shows a right lower lobe PE mall amount right heart strain___   PROCEDURES  Procedure(s) performed:   Procedures  Critical Care performed  ____________________________________________   INITIAL IMPRESSION / ASSESSMENT AND PLAN / ED COURSE  Pertinent labs & imaging results that were available during my care of the patient were reviewed by me and considered in my medical decision making (see chart for details).        ____________________________________________   FINAL CLINICAL IMPRESSION(S) / ED DIAGNOSES  Final diagnoses:  Other acute pulmonary embolism without acute cor pulmonale (HCC)      NEW MEDICATIONS STARTED DURING THIS VISIT:  New Prescriptions   No medications on file     Note:  This document was prepared using Dragon voice recognition software and may include unintentional dictation errors.    Nena Polio, MD 08/09/16 [phone]

## 2016-08-10 ENCOUNTER — Inpatient Hospital Stay: Payer: Medicare Other

## 2016-08-10 ENCOUNTER — Inpatient Hospital Stay
Admit: 2016-08-10 | Discharge: 2016-08-10 | Disposition: A | Discharge status: Home / Self Care | Payer: Medicare Other | Attending: Internal Medicine | Admitting: Internal Medicine

## 2016-08-10 LAB — CBC
HCT: 37.7 % — ABNORMAL LOW (ref 40.0–52.0)
Hemoglobin: 12.9 g/dL — ABNORMAL LOW (ref 13.0–18.0)
MCH: 31.6 pg (ref 26.0–34.0)
MCHC: 34.3 g/dL (ref 32.0–36.0)
MCV: 92.1 fL (ref 80.0–100.0)
Platelets: 207 10*3/uL (ref 150–440)
RBC: 4.09 MIL/uL — ABNORMAL LOW (ref 4.40–5.90)
RDW: 13.2 % (ref 11.5–14.5)
WBC: 9.4 10*3/uL (ref 3.8–10.6)

## 2016-08-10 LAB — GLUCOSE, CAPILLARY
Glucose-Capillary: 141 mg/dL — ABNORMAL HIGH (ref 65–99)
Glucose-Capillary: 136 mg/dL — ABNORMAL HIGH (ref 65–99)
Glucose-Capillary: 157 mg/dL — ABNORMAL HIGH (ref 65–99)
Glucose-Capillary: 108 mg/dL — ABNORMAL HIGH (ref 65–99)
Glucose-Capillary: 169 mg/dL — ABNORMAL HIGH (ref 65–99)

## 2016-08-10 LAB — HEPARIN LEVEL (UNFRACTIONATED)
Heparin Unfractionated: 0.22 IU/mL — ABNORMAL LOW (ref 0.30–0.70)
Heparin Unfractionated: 0.3 IU/mL (ref 0.30–0.70)

## 2016-08-10 LAB — BASIC METABOLIC PANEL
Anion gap: 8 (ref 5–15)
BUN: 28 mg/dL — ABNORMAL HIGH (ref 6–20)
CO2: 26 mmol/L (ref 22–32)
Calcium: 8.6 mg/dL — ABNORMAL LOW (ref 8.9–10.3)
Chloride: 103 mmol/L (ref 101–111)
Creatinine, Ser: 1.29 mg/dL — ABNORMAL HIGH (ref 0.61–1.24)
GFR calc Af Amer: >60 mL/min (ref >60)
GFR calc non Af Amer: 54 mL/min — ABNORMAL LOW (ref >60)
Glucose, Bld: 150 mg/dL — ABNORMAL HIGH (ref 65–99)
Potassium: 3.6 mmol/L (ref 3.5–5.1)
Sodium: 137 mmol/L (ref 135–145)

## 2016-08-10 MED ORDER — HEPARIN BOLUS VIA INFUSION
1200.0000 [IU] | Freq: Once | INTRAVENOUS | Status: AC
  Administered 2016-08-10: 1200 [IU] via INTRAVENOUS
  Filled 2016-08-10: qty 1200

## 2016-08-10 MED ORDER — OXYCODONE HCL 5 MG PO TABS
5.0000 mg | ORAL_TABLET | ORAL | Status: DC | PRN
  Administered 2016-08-11: 5 mg via ORAL
  Filled 2016-08-10: qty 1

## 2016-08-10 NOTE — Progress Notes (Addendum)
Verbal order from MD Raul Del to make pt NPO effective 0000, in the event pt goes for filter procedure  tomm and oxycodone 5 mg Q4 PRN for pain. Order placed

## 2016-08-10 NOTE — Progress Notes (Addendum)
Date: 08/10/2016,   MRN# [id number] Cory Hardin 26-Jun-1946 Code Status:     Code Status Orders        Start     Ordered   08/09/16 2044  Full code  Continuous     08/09/16 2044    Code Status History    Date Active Date Inactive Code Status Order ID Comments User Context   This patient has a current code status but no historical code status.     Hosp day:@LENGTHOFSTAYDAYS @ Referring MD: @ATDPROV @        AdmissionWeight: 192 lb (87.1 kg)                 CurrentWeight: 200 lb (90.7 kg) . CC: RIGHT PULMONARY EMBOLISM with right heart strain and acute on chronic femoral artery clot  HPI: This is 71 year old  Male with the above presentation. Pulmonary consulted to assist in his care. He is an ex  Administrator. Known to have had a right DVT, was on eliquis, switched to plavix. 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 He is on heparin now, systolic pressure [id number]. Oxygenating fairly well. No hemoptysis. No syncope. Ct suggest right heart strain,. Spoke with vascular regarding need for filter. (Dr.Dew). He has bladder cancer, quit smoking 30 years ago.   PMHX:   Past Medical History:  Diagnosis Date  . Bilateral kidney stones   . BPH (benign prostatic hyperplasia)   . Cancer ([place])    bladder  . 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 Chronic kidney disease    stones,renal failure  . Diabetes mellitus without complication ([place])   . DVT (deep venous thrombosis) ([place])   . GERD (gastroesophageal reflux disease)   . Hematuria   . Hyperlipidemia   . Hypertension    Surgical Hx:  Past Surgical History:  Procedure Laterality Date  . APPENDECTOMY    . bladder stones    . CYSTOSCOPY    . CYSTOSCOPY WITH BIOPSY N/A 11/20/2014   Procedure: CYSTOSCOPY WITH BIOPSY;  Surgeon: [name], MD;  Location: [facility] ORS;  Service: Urology;  Laterality: N/A;  . CYSTOSCOPY WITH STENT PLACEMENT Right 06/23/2015    Procedure: CYSTOSCOPY WITH STENT PLACEMENT;  Surgeon: [name], MD;  Location: [facility] ORS;  Service: Urology;  Laterality: Right;  . PERCUTANEOUS NEPHROLITHOTRIPSY    . TRANSURETHRAL RESECTION OF BLADDER TUMOR N/A 06/23/2015   Procedure: TRANSURETHRAL RESECTION OF BLADDER TUMOR (TURBT);  Surgeon: [name], MD;  Location: [facility] ORS;  Service: Urology;  Laterality: N/A;  . TRANSURETHRAL RESECTION OF BLADDER TUMOR WITH GYRUS (TURBT-GYRUS)    . URETEROSCOPY WITH HOLMIUM LASER LITHOTRIPSY Right 06/23/2015   Procedure: URETEROSCOPY WITH HOLMIUM LASER LITHOTRIPSY;  Surgeon: [name], MD;  Location: [facility] ORS;  Service: Urology;  Laterality: Right;   Family Hx:  Family History  Problem Relation Age of Onset  . Heart disease Father   . Heart disease Mother   . Heart failure Mother   . COPD Mother   . Cancer Maternal Aunt   . Heart disease Brother   . Diabetes Brother   . Kidney disease Neg Hx   . Prostate cancer Neg Hx    Social Hx:   Social History  Substance Use Topics  . Smoking status: Former Smoker    Packs/day: 0.25    Years: 15.00    Types: Cigarettes    Quit date: 11/13/1982  .  Smokeless tobacco: Never Used  . Alcohol use 0.6 oz/week    1 Standard drinks or equivalent per week     Comment: rare   Medication:    Home Medication:    Current Medication: @CURMEDTAB @   Allergies:  Patient has no known allergies.  Review of Systems: Gen:  Denies  fever, sweats, chills HEENT: Denies blurred vision, double vision, ear pain, eye pain, hearing loss, nose bleeds, sore throat Cvc:  No dizziness, chest pain or heaviness Resp:    Gi: Denies swallowing difficulty, stomach pain, nausea or vomiting, diarrhea, constipation, bowel incontinence Gu:  Denies bladder incontinence, burning urine Ext:   No Joint pain, stiffness or swelling Skin: No skin rash, easy bruising or bleeding or hives Endoc:  No polyuria, polydipsia , polyphagia or weight change Psych: No  depression, insomnia or hallucinations  Other:  All other systems negative  Physical Examination:   VS: BP 133/73 (BP Location: Left Arm)   Pulse 64   Temp 99.2 F (37.3 C) (Oral)   Resp 16   Ht 6' (1.829 m)   Wt 200 lb (90.7 kg)   SpO2 93%   BMI 27.12 kg/m   General Appearance: No distress  Neuro: without focal findings, mental status, speech normal, alert and oriented, cranial nerves 2-12 intact, reflexes normal and symmetric, sensation grossly normal  HEENT: PERRLA, EOM intact, no ptosis, no other lesions noticed, Mallampati: Pulmonary:.No wheezing, No rales  Sputum Production:   Cardiovascular:  Normal S1,S2.  No m/r/g.  Abdominal aorta pulsation normal.    Abdomen:Benign, Soft, non-tender, No masses, hepatosplenomegaly, No lymphadenopathy Endoc: No evident thyromegaly, no signs of acromegaly or Cushing features Skin:   warm, no rashes, no ecchymosis  Extremities: normal, no cyanosis, clubbing, no edema, warm with normal capillary refill. Other findings:   Labs results:   Recent Labs     08/09/16  1432  08/09/16  2054  08/10/16  0419  HGB  14.6   --   12.9*  HCT  41.2   --   37.7*  MCV  91.2   --   92.1  WBC  11.2*   --   9.4  BUN  31*   --   28*  CREATININE  1.61*   --   1.29*  GLUCOSE  144*   --   150*  CALCIUM  9.3   --   8.6*  INR   --   1.51   --   ,    No results for input(s): PH in the last 72 hours.  Invalid input(s): PCO2, PO2, BASEEXCESS, BASEDEFICITE, TFT  Culture results:     Rad results:   Ct Angio Chest Pe W And/or Wo Contrast  Result Date: 08/09/2016 CLINICAL DATA:  Severe right chest pain, back cough EXAM: CT ANGIOGRAPHY CHEST WITH CONTRAST TECHNIQUE: Multidetector CT imaging of the chest was performed using the standard protocol during bolus administration of intravenous contrast. Multiplanar CT image reconstructions and MIPs were obtained to evaluate the vascular anatomy. CONTRAST:  60 mL Isovue 370 COMPARISON:  None. FINDINGS:  Cardiovascular: Satisfactory opacification of the pulmonary arteries to the segmental level. Acute pulmonary embolus in the lobar, segmental and subsegmental branches of the right lower lobe. Normal heart size. No pericardial effusion. Mediastinum/Nodes: No enlarged mediastinal, hilar, or axillary lymph nodes. Thyroid gland, trachea, and esophagus demonstrate no significant findings. Lungs/Pleura: Small right pleural effusion. Right lower lobe airspace disease likely reflecting atelectasis versus infarct. Left lower lobe airspace disease likely reflecting atelectasis.  Upper Abdomen: No acute upper abdominal abnormality. Cholelithiasis. Small hiatal hernia. Musculoskeletal: No acute osseous abnormality. No lytic or sclerotic osseous lesion. Review of the MIP images confirms the above findings. IMPRESSION: 1. Acute pulmonary embolus involving the lobar, segmental and subsegmental branches of the right lower lobe. Positive for acute PE with CT evidence of right heart strain (RV/LV Ratio = 1.2) consistent with at least submassive (intermediate risk) PE. The presence of right heart strain has been associated with an increased risk of morbidity and mortality. Please activate Code PE by paging [phone]. 2. Small right pleural effusion. Right lower lobe airspace disease likely reflecting atelectasis versus infarct. 3. Cholelithiasis. Critical Value/emergent results were called by telephone at the time of interpretation on 08/09/2016 at 7:26 pm to Dr. Conni Slipper , who verbally acknowledged these results. Electronically Signed   By: Kathreen Devoid   On: 08/09/2016 19:26   US Venous Img Lower Bilateral  Result Date: 08/10/2016 CLINICAL DATA:  History of pulmonary embolism. History of prior DVT (02/09/2016). Evaluate for acute or chronic DVT. EXAM: BILATERAL LOWER EXTREMITY VENOUS DOPPLER ULTRASOUND TECHNIQUE: Gray-scale sonography with graded compression, as well as color Doppler and duplex ultrasound were performed to  evaluate the lower extremity deep venous systems from the level of the common femoral vein and including the common femoral, femoral, profunda femoral, popliteal and calf veins including the posterior tibial, peroneal and gastrocnemius veins when visible. The superficial great saphenous vein was also interrogated. Spectral Doppler was utilized to evaluate flow at rest and with distal augmentation maneuvers in the common femoral, femoral and popliteal veins. COMPARISON:  Right lower extremity venous Doppler ultrasound - 02/09/2016 FINDINGS: RIGHT LOWER EXTREMITY Saphenofemoral Junction: No evidence of thrombus. Normal compressibility and flow on color Doppler imaging. Profunda Femoral Vein: No evidence of thrombus. Normal compressibility and flow on color Doppler imaging. There is mixed echogenic nonocclusive thrombus within the right common femoral (images 2 and 3) veins, progressed since the 02/09/2016 examination. There is mixed echogenic mixed occlusive and nonocclusive thrombus within the proximal (image 14), mid (image 19) and distal (image 22) aspects of the right femoral vein, similar to the 02/2016. There is mixed echogenic near occlusive thrombus within the imaged course of the right popliteal vein (image 25, improved compared to the 02/09/2016 examination. Other Findings:  None. LEFT LOWER EXTREMITY Common Femoral Vein: No evidence of thrombus. Normal compressibility, respiratory phasicity and response to augmentation. Saphenofemoral Junction: No evidence of thrombus. Normal compressibility and flow on color Doppler imaging. Profunda Femoral Vein: No evidence of thrombus. Normal compressibility and flow on color Doppler imaging. Femoral Vein: No evidence of thrombus. Normal compressibility, respiratory phasicity and response to augmentation. Popliteal Vein: No evidence of thrombus. Normal compressibility, respiratory phasicity and response to augmentation. Calf Veins: No evidence of thrombus. Normal  compressibility and flow on color Doppler imaging. Superficial Great Saphenous Vein: No evidence of thrombus. Normal compressibility and flow on color Doppler imaging. Venous Reflux:  None. Other Findings:  None. IMPRESSION: 1. Suspected acute on chronic nonocclusive DVT involving the right common femoral vein. 2. Unchanged to minimally improved appearance of mixed occlusive and nonocclusive DVT involving the right superficial femoral and popliteal veins. 3. No evidence of acute or chronic DVT within the left lower extremity. Electronically Signed   By: Sandi Mariscal M.D.   On: 08/10/2016 10:41     Assessment and Plan: Right sub acute pulmonary embolism, right DVT. There is right heart strain. Thus far he is maintaining good blood pressure. On heparin.  Oxygenating fairly well. Hence at this time do not think he need to treated with thrombolytics. There is progression of clot in the common femoral artery. With this info, will ask vascular if venous filter is indicated ( done).   I have personally obtained a history, examined the patient, evaluated laboratory and imaging results, formulated the assessment and plan and placed orders.  The Patient requires high complexity decision making for assessment and support, frequent evaluation and titration of therapies, application of advanced monitoring technologies and extensive interpretation of multiple databases.   Amalio Loe,M.D. Pulmonary & Critical care Medicine [facility]

## 2016-08-10 NOTE — Progress Notes (Signed)
ANTICOAGULATION CONSULT NOTE - Initial Consult  Pharmacy Consult for Heparin Drip  Indication: pulmonary embolus  No Known Allergies  Patient Measurements: Height: 6' (182.9 cm) Weight: 200 lb (90.7 kg) IBW/kg (Calculated) : 77.6  Vital Signs: Temp: 99.2 F (37.3 C) (02/06 1436) Temp Source: Oral (02/06 1436) BP: 133/73 (02/06 1436) Pulse Rate: 64 (02/06 1436)  Labs:  Recent Labs  08/09/16 1432 08/09/16 1843 08/09/16 2054 08/10/16 0419 08/10/16 1500  HGB 14.6  --   --  12.9*  --   HCT 41.2  --   --  37.7*  --   PLT 239  --   --  207  --   APTT  --   --  32  --   --   LABPROT  --   --  18.4*  --   --   INR  --   --  1.51  --   --   HEPARINUNFRC  --   --   --  0.22* 0.30  CREATININE 1.61*  --   --  1.29*  --   TROPONINI <0.03 <0.03  --   --   --     Estimated Creatinine Clearance: 57.6 mL/min (by C-G formula based on SCr of 1.29 mg/dL (H)).   Medical History: Past Medical History:  Diagnosis Date  . Bilateral kidney stones   . BPH (benign prostatic hyperplasia)   . Cancer ([place])    bladder  . 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 Chronic kidney disease    stones,renal failure  . Diabetes mellitus without complication ([place])   . DVT (deep venous thrombosis) ([place])   . GERD (gastroesophageal reflux disease)   . Hematuria   . Hyperlipidemia   . Hypertension    Assessment: 71 yo male with PMH of bladder cancer found to have PE. Pharmacy consulted for heparin dosing and monitoring.   Goal of Therapy:  Heparin level 0.3-0.7 units/ml Monitor platelets by anticoagulation protocol: Yes   Plan:  Baseline labs ordered.  Give 4000 units bolus x 1 Start heparin infusion at 1400 units/hr Check anti-Xa level in 8 hours and daily while on heparin Continue to monitor H&H and platelets   2/6 AM heparin level 0.22. 1200 unit bolus and increase rate to 1550 units/hr. Recheck in 8 hours.  2/6 1500 HL 0.30. Level is right at therapeutic. Will  increase infusion to 1600 units/hr and recheck HL in 8 hours. If level remains therapeutic, will move to check HL daily with AM labs. CBC ordered with AM labs.   Pernell Dupre, PharmD, BCPS Clinical Pharmacist 08/10/2016 4:06 PM

## 2016-08-10 NOTE — Progress Notes (Signed)
Prime doc notified to see if patient could come off telemetry. Orders to follow if MD feel this is appropriate

## 2016-08-10 NOTE — Progress Notes (Signed)
ANTICOAGULATION CONSULT NOTE - Initial Consult  Pharmacy Consult for Heparin Drip  Indication: pulmonary embolus  No Known Allergies  Patient Measurements: Height: 6' (182.9 cm) Weight: 200 lb (90.7 kg) IBW/kg (Calculated) : 77.6  Vital Signs: Temp: 98.4 F (36.9 C) (02/06 0429) Temp Source: Oral (02/06 0429) BP: 142/76 (02/06 0429) Pulse Rate: 62 (02/06 0429)  Labs:  Recent Labs  08/09/16 1432 08/09/16 1843 08/09/16 2054 08/10/16 0419  HGB 14.6  --   --  12.9*  HCT 41.2  --   --  37.7*  PLT 239  --   --  207  APTT  --   --  32  --   LABPROT  --   --  18.4*  --   INR  --   --  1.51  --   HEPARINUNFRC  --   --   --  0.22*  CREATININE 1.61*  --   --  1.29*  TROPONINI <0.03 <0.03  --   --     Estimated Creatinine Clearance: 57.6 mL/min (by C-G formula based on SCr of 1.29 mg/dL (H)).   Medical History: Past Medical History:  Diagnosis Date  . Bilateral kidney stones   . BPH (benign prostatic hyperplasia)   . Cancer ([place])    bladder  . 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 Chronic kidney disease    stones,renal failure  . Diabetes mellitus without complication ([place])   . DVT (deep venous thrombosis) ([place])   . GERD (gastroesophageal reflux disease)   . Hematuria   . Hyperlipidemia   . Hypertension    Assessment: 71 yo male with PMH of bladder cancer found to have PE. Pharmacy consulted for heparin dosing and monitoring.   Goal of Therapy:  Heparin level 0.3-0.7 units/ml Monitor platelets by anticoagulation protocol: Yes   Plan:  Baseline labs ordered.  Give 4000 units bolus x 1 Start heparin infusion at 1400 units/hr Check anti-Xa level in 8 hours and daily while on heparin Continue to monitor H&H and platelets   2/6 AM heparin level 0.22. 1200 unit bolus and increase rate to 1550 units/hr. Recheck in 8 hours.  Eloise Harman, PharmD, BCPS Clinical Pharmacist 08/10/2016 6:35 AM

## 2016-08-10 NOTE — Progress Notes (Signed)
[facility] [place] NAME: Avrohom Mcelveen    MR#:  [id number]  DATE OF BIRTH:  [birth year]  SUBJECTIVE:   Patient continues to have right-sided pleuritic chest pain  REVIEW OF SYSTEMS:    Review of Systems  Constitutional: Negative.  Negative for chills, fever and malaise/fatigue.  HENT: Negative.  Negative for ear discharge, ear pain, hearing loss, nosebleeds and sore throat.   Eyes: Negative.  Negative for blurred vision and pain.  Respiratory: Negative.  Negative for cough, hemoptysis, shortness of breath and wheezing.   Cardiovascular: Positive for chest pain. Negative for palpitations and leg swelling.  Gastrointestinal: Negative.  Negative for abdominal pain, blood in stool, diarrhea, nausea and vomiting.  Genitourinary: Negative.  Negative for dysuria.  Musculoskeletal: Negative.  Negative for back pain.  Skin: Negative.   Neurological: Negative for dizziness, tremors, speech change, focal weakness, seizures and headaches.  Endo/Heme/Allergies: Negative.  Does not bruise/bleed easily.  Psychiatric/Behavioral: Negative.  Negative for depression, hallucinations and suicidal ideas.    Tolerating Diet: yes      DRUG ALLERGIES:  No Known Allergies  VITALS:  Blood pressure (!) 144/70, pulse 63, temperature 98.8 F (37.1 C), temperature source Oral, resp. rate 18, height 6' (1.829 m), weight 90.7 kg (200 lb), SpO2 95 %.  PHYSICAL EXAMINATION:   Physical Exam    LABORATORY PANEL:   CBC  Recent Labs Lab 08/10/16 0419  WBC 9.4  HGB 12.9*  HCT 37.7*  PLT 207   ------------------------------------------------------------------------------------------------------------------  Chemistries   Recent Labs Lab 08/09/16 1432 08/10/16 0419  NA 138 137  K 4.4 3.6  CL 101 103  CO2 29 26  GLUCOSE 144* 150*  BUN 31* 28*  CREATININE 1.61* 1.29*  CALCIUM 9.3 8.6*  AST 31  --   ALT 23  --   ALKPHOS 84  --   BILITOT 1.1  --     ------------------------------------------------------------------------------------------------------------------  Cardiac Enzymes  Recent Labs Lab 08/09/16 1432 08/09/16 1843  TROPONINI <0.03 <0.03   ------------------------------------------------------------------------------------------------------------------  RADIOLOGY:  Dg Chest 2 View  Result Date: 08/09/2016 CLINICAL DATA:  Sick with cough and congestion EXAM: CHEST  2 VIEW COMPARISON:  05/23/2001 CXR report FINDINGS: Heart is top normal. No aortic aneurysm. There is mild aortic atherosclerosis. Low lung volumes with slight elevation of right hemidiaphragm. Bibasilar atelectasis and/or scarring is noted. Trace fluid within the right major fissure. No pulmonary consolidation or CHF. No acute osseous abnormality. Mild degenerative changes along the dorsal spine. IMPRESSION: No active cardiopulmonary disease. Bibasilar atelectasis and/or scarring. Trace fluid along the right major fissure. Electronically Signed   By: Ashley Royalty M.D.   On: 08/09/2016 15:23   Ct Angio Chest Pe W And/or Wo Contrast  Result Date: 08/09/2016 CLINICAL DATA:  Severe right chest pain, back cough EXAM: CT ANGIOGRAPHY CHEST WITH CONTRAST TECHNIQUE: Multidetector CT imaging of the chest was performed using the standard protocol during bolus administration of intravenous contrast. Multiplanar CT image reconstructions and MIPs were obtained to evaluate the vascular anatomy. CONTRAST:  60 mL Isovue 370 COMPARISON:  None. FINDINGS: Cardiovascular: Satisfactory opacification of the pulmonary arteries to the segmental level. Acute pulmonary embolus in the lobar, segmental and subsegmental branches of the right lower lobe. Normal heart size. No pericardial effusion. Mediastinum/Nodes: No enlarged mediastinal, hilar, or axillary lymph nodes. Thyroid gland, trachea, and esophagus demonstrate no significant findings. Lungs/Pleura: Small right pleural effusion. Right lower  lobe airspace disease likely reflecting atelectasis versus infarct. Left lower  lobe airspace disease likely reflecting atelectasis. Upper Abdomen: No acute upper abdominal abnormality. Cholelithiasis. Small hiatal hernia. Musculoskeletal: No acute osseous abnormality. No lytic or sclerotic osseous lesion. Review of the MIP images confirms the above findings. IMPRESSION: 1. Acute pulmonary embolus involving the lobar, segmental and subsegmental branches of the right lower lobe. Positive for acute PE with CT evidence of right heart strain (RV/LV Ratio = 1.2) consistent with at least submassive (intermediate risk) PE. The presence of right heart strain has been associated with an increased risk of morbidity and mortality. Please activate Code PE by paging [phone]. 2. Small right pleural effusion. Right lower lobe airspace disease likely reflecting atelectasis versus infarct. 3. Cholelithiasis. Critical Value/emergent results were called by telephone at the time of interpretation on 08/09/2016 at 7:26 pm to Dr. Conni Slipper , who verbally acknowledged these results. Electronically Signed   By: Kathreen Devoid   On: 08/09/2016 19:26     ASSESSMENT AND PLAN:    71 year old male with a past history of DVT who presented with shortness of breath and found to have right lower lobe submassive pulmonary emboli with right heart strain on CT scan.  1. Acute submassive pulmonary emboli with possible pulmonary infarct/Right heart strain: Continue heparin drip Pulmonary consultation Follow up on echocardiogram Will need lifelong anticoagulation  2. Diabetes: Continue sliding scale insulin, ADA diet and Amaryl  3 chronic kidney disease stage III: Creatinine at baseline  4. Hyperlipidemia: Continue fenofibrate and pravastatin  5. Essential hypertension: Continue HCTZ and Coreg    Management plans discussed with the patient and he is in agreement.  CODE STATUS: full  TOTAL TIME TAKING CARE OF THIS PATIENT:  30 minutes.     POSSIBLE D/C tomorrow, DEPENDING ON CLINICAL CONDITION.   Dakwon Wenberg M.D on 08/10/2016 at 9:04 AM  Between 7am to 6pm - Pager - [phone] After 6pm go to [email or web address] - password [email or web address] [facility]  Office  [phone]  CC: Primary care physician; Albina Billet, MD  Note: This dictation was prepared with Dragon dictation along with smaller phrase technology. Any transcriptional errors that result from this process are unintentional.

## 2016-08-11 ENCOUNTER — Encounter
Admission: EM | Disposition: A | Discharge status: Home / Self Care | Payer: Self-pay | Source: Home / Self Care | Attending: Internal Medicine

## 2016-08-11 DIAGNOSIS — I82401 Acute embolism and thrombosis of unspecified deep veins of right lower extremity: Secondary | ICD-10-CM

## 2016-08-11 DIAGNOSIS — R05 Cough: Secondary | ICD-10-CM

## 2016-08-11 DIAGNOSIS — R071 Chest pain on breathing: Secondary | ICD-10-CM

## 2016-08-11 DIAGNOSIS — I2699 Other pulmonary embolism without acute cor pulmonale: Secondary | ICD-10-CM

## 2016-08-11 DIAGNOSIS — R0602 Shortness of breath: Secondary | ICD-10-CM

## 2016-08-11 HISTORY — PX: IVC FILTER INSERTION: CATH118245

## 2016-08-11 HISTORY — PX: PULMONARY THROMBECTOMY: CATH118295

## 2016-08-11 LAB — MAGNESIUM: Magnesium: 2 mg/dL (ref 1.7–2.4)

## 2016-08-11 LAB — GLUCOSE, CAPILLARY
Glucose-Capillary: 123 mg/dL — ABNORMAL HIGH (ref 65–99)
Glucose-Capillary: 101 mg/dL — ABNORMAL HIGH (ref 65–99)
Glucose-Capillary: 111 mg/dL — ABNORMAL HIGH (ref 65–99)
Glucose-Capillary: 180 mg/dL — ABNORMAL HIGH (ref 65–99)
Glucose-Capillary: 198 mg/dL — ABNORMAL HIGH (ref 65–99)

## 2016-08-11 LAB — CBC
HCT: 38.6 % — ABNORMAL LOW (ref 40.0–52.0)
Hemoglobin: 13.5 g/dL (ref 13.0–18.0)
MCH: 31.7 pg (ref 26.0–34.0)
MCHC: 34.9 g/dL (ref 32.0–36.0)
MCV: 90.8 fL (ref 80.0–100.0)
Platelets: 260 10*3/uL (ref 150–440)
RBC: 4.25 MIL/uL — ABNORMAL LOW (ref 4.40–5.90)
RDW: 13.3 % (ref 11.5–14.5)
WBC: 9.3 10*3/uL (ref 3.8–10.6)

## 2016-08-11 LAB — BASIC METABOLIC PANEL
Anion gap: 9 (ref 5–15)
BUN: 30 mg/dL — ABNORMAL HIGH (ref 6–20)
CO2: 26 mmol/L (ref 22–32)
Calcium: 8.7 mg/dL — ABNORMAL LOW (ref 8.9–10.3)
Chloride: 102 mmol/L (ref 101–111)
Creatinine, Ser: 1.3 mg/dL — ABNORMAL HIGH (ref 0.61–1.24)
GFR calc Af Amer: >60 mL/min (ref >60)
GFR calc non Af Amer: 54 mL/min — ABNORMAL LOW (ref >60)
Glucose, Bld: 163 mg/dL — ABNORMAL HIGH (ref 65–99)
Potassium: 3.2 mmol/L — ABNORMAL LOW (ref 3.5–5.1)
Sodium: 137 mmol/L (ref 135–145)

## 2016-08-11 LAB — ECHOCARDIOGRAM COMPLETE
Height: 72 in
Weight: 3200 oz

## 2016-08-11 LAB — HEPARIN LEVEL (UNFRACTIONATED): Heparin Unfractionated: 0.34 IU/mL (ref 0.30–0.70)

## 2016-08-11 SURGERY — IVC FILTER INSERTION
Anesthesia: Moderate Sedation

## 2016-08-11 SURGERY — PULMONARY THROMBECTOMY
Anesthesia: Moderate Sedation

## 2016-08-11 MED ORDER — CEFAZOLIN IN D5W 1 GM/50ML IV SOLN
1.0000 g | Freq: Once | INTRAVENOUS | Status: AC
  Administered 2016-08-11: 1 g via INTRAVENOUS

## 2016-08-11 MED ORDER — HYDROMORPHONE HCL 1 MG/ML IJ SOLN: 1.0000 mg | Freq: Once | INTRAMUSCULAR | Status: DC

## 2016-08-11 MED ORDER — FENTANYL CITRATE (PF) 100 MCG/2ML IJ SOLN
INTRAMUSCULAR | Status: DC | PRN
  Administered 2016-08-11: 50 ug via INTRAVENOUS

## 2016-08-11 MED ORDER — MIDAZOLAM HCL 2 MG/2ML IJ SOLN
INTRAMUSCULAR | Status: DC | PRN
  Administered 2016-08-11: 1 mg via INTRAVENOUS

## 2016-08-11 MED ORDER — POTASSIUM CHLORIDE 20 MEQ PO PACK
40.0000 meq | PACK | Freq: Once | ORAL | Status: AC
  Administered 2016-08-11: 40 meq via ORAL
  Filled 2016-08-11: qty 2

## 2016-08-11 MED ORDER — ALTEPLASE 2 MG IJ SOLR
INTRAMUSCULAR | Status: DC | PRN
  Administered 2016-08-11: 8 mg

## 2016-08-11 MED ORDER — CEFAZOLIN IN D5W 1 GM/50ML IV SOLN
INTRAVENOUS | Status: AC
  Filled 2016-08-11: qty 50

## 2016-08-11 MED ORDER — FENTANYL CITRATE (PF) 100 MCG/2ML IJ SOLN
INTRAMUSCULAR | Status: AC
  Filled 2016-08-11: qty 2

## 2016-08-11 MED ORDER — HEPARIN SODIUM (PORCINE) 1000 UNIT/ML IJ SOLN
INTRAMUSCULAR | Status: AC
Start: 2016-08-11 — End: 2016-08-11
  Filled 2016-08-11: qty 1

## 2016-08-11 MED ORDER — MIDAZOLAM HCL 5 MG/5ML IJ SOLN
INTRAMUSCULAR | Status: AC
  Filled 2016-08-11: qty 5

## 2016-08-11 MED ORDER — RIVAROXABAN 15 MG PO TABS: 15.0000 mg | ORAL_TABLET | Freq: Once | ORAL | Status: DC

## 2016-08-11 MED ORDER — MIDAZOLAM HCL 2 MG/2ML IJ SOLN
INTRAMUSCULAR | Status: DC | PRN
  Administered 2016-08-11: 2 mg via INTRAVENOUS

## 2016-08-11 MED ORDER — IOPAMIDOL (ISOVUE-300) INJECTION 61%
INTRAVENOUS | Status: DC | PRN
  Administered 2016-08-11: 15 mL via INTRAVENOUS

## 2016-08-11 MED ORDER — FENTANYL CITRATE (PF) 100 MCG/2ML IJ SOLN
INTRAMUSCULAR | Status: DC | PRN
  Administered 2016-08-11: 25 ug via INTRAVENOUS

## 2016-08-11 MED ORDER — MIDAZOLAM HCL 2 MG/2ML IJ SOLN
INTRAMUSCULAR | Status: AC
  Filled 2016-08-11: qty 2

## 2016-08-11 MED ORDER — HEPARIN SODIUM (PORCINE) 1000 UNIT/ML IJ SOLN
INTRAMUSCULAR | Status: DC | PRN
  Administered 2016-08-11: 3000 [IU] via INTRAVENOUS

## 2016-08-11 MED ORDER — RIVAROXABAN 15 MG PO TABS: 15.0000 mg | ORAL_TABLET | Freq: Two times a day (BID) | ORAL | Status: DC

## 2016-08-11 MED ORDER — LIDOCAINE-EPINEPHRINE (PF) 2 %-1:200000 IJ SOLN
INTRAMUSCULAR | Status: AC
  Filled 2016-08-11: qty 20

## 2016-08-11 MED ORDER — IOPAMIDOL (ISOVUE-300) INJECTION 61%
INTRAVENOUS | Status: DC | PRN
  Administered 2016-08-11: 50 mL via INTRAVENOUS

## 2016-08-11 MED ORDER — HEPARIN (PORCINE) IN NACL 2-0.9 UNIT/ML-% IJ SOLN
INTRAMUSCULAR | Status: AC
  Filled 2016-08-11: qty 1000

## 2016-08-11 MED ORDER — RIVAROXABAN 20 MG PO TABS: 20.0000 mg | ORAL_TABLET | Freq: Every day | ORAL | Status: DC

## 2016-08-11 SURGICAL SUPPLY — 15 items
CANISTER PENUMBRA MAX (MISCELLANEOUS) ×3 IMPLANT
CATH ANGIO 5F 100CM .035 PIG (CATHETERS) ×3 IMPLANT
CATH INDIGO 8 XTORQ TIP 115CM (CATHETERS) ×6 IMPLANT
CATH VISTA GUIDE 6FR JR4 (CATHETERS) ×3 IMPLANT
PACK ANGIOGRAPHY (CUSTOM PROCEDURE TRAY) ×3 IMPLANT
SHEATH BRITE TIP 5FRX11 (SHEATH) ×3 IMPLANT
SHEATH DESTINATION 8FR 90CM (SHEATH) ×3 IMPLANT
SHEATH RAABE 8FRX55 (SHEATH) ×3 IMPLANT
SYR MEDRAD MARK V 150ML (SYRINGE) ×3 IMPLANT
TUBING ASPIRATION INDIGO (MISCELLANEOUS) ×3 IMPLANT
TUBING CONTRAST HIGH PRESS 72 (TUBING) ×3 IMPLANT
VALVE CHECKFLO PERFORMER (SHEATH) ×3 IMPLANT
WIRE J 3MM .035X145CM (WIRE) ×3 IMPLANT
WIRE MAGIC TOR.035 180C (WIRE) ×3 IMPLANT
WIRE MAGIC TORQUE 260C (WIRE) ×3 IMPLANT

## 2016-08-11 SURGICAL SUPPLY — 4 items
FILTER VC CELECT-FEMORAL (Filter) ×3 IMPLANT
NEEDLE PERC 18GX7CM (NEEDLE) ×3 IMPLANT
PACK ANGIOGRAPHY (CUSTOM PROCEDURE TRAY) ×3 IMPLANT
WIRE J 3MM .035X145CM (WIRE) ×3 IMPLANT

## 2016-08-11 NOTE — Op Note (Signed)
[facility]   OPERATIVE NOTE    PRE-OPERATIVE DIAGNOSIS: Extensive RLE DVT, PE with right heart strain  POST-OPERATIVE DIAGNOSIS: same as above  PROCEDURE: 1.   Ultrasound guidance for vascular access to the left femoral vein 2.   Catheter placement into the inferior vena cava 3.   Inferior venacavogram 4.   Placement of a Cook Celect IVC filter  SURGEON: Leotis Pain, MD  ASSISTANT(S): None  ANESTHESIA: local with Moderate Conscious Sedation for approximately 15 minutes using 1 mg of Versed and 25 mcg of Fentanyl  ESTIMATED BLOOD LOSS: minimal  CONTRAST: 15 cc  FLUORO TIME: less than one minute  FINDING(S): 1.  Patent IVC  SPECIMEN(S):  none  INDICATIONS:   Cory Hardin is a 71 y.o. male who presents with acute on chronic RLE DVT and a large right PE with right heart strain, hypoxia on oxygen, and labored breathing.  Pulmonary lysis and thrombectomy are being considered, and IVC filter is planned to avoid any further embolization that would be fatal.  Inferior vena cava filter is indicated for this reason.  Risks and benefits including filter thrombosis, migration, fracture, bleeding, and infection were all discussed.  We discussed that all IVC filters that we place can be removed if desired from the patient once the need for the filter has passed.    DESCRIPTION: After obtaining full informed written consent, the patient was brought back to the vascular suite. The skin was sterilely prepped and draped in a sterile surgical field was created. Moderate conscious sedation was administered during a face to face encounter with the patient throughout the procedure with my supervision of the RN administering medicines and monitoring the patient's vital signs, pulse oximetry, telemetry and mental status throughout from the start of the procedure until the patient was taken to the recovery room. The left femoral vein was accessed under direct ultrasound guidance  without difficulty with a Seldinger needle and a J-wire was then placed. After skin nick and dilatation, the delivery sheath was placed into the inferior vena cava and an inferior venacavogram was performed. This demonstrated a patent IVC with the level of the renal veins at L1.  The filter was then deployed into the inferior vena cava at the level of the bottom of L1 just below the renal veins. The delivery sheath was then removed. Pressure was held. Sterile dressings were placed. The patient tolerated the procedure well and was taken to the recovery room in stable condition.  COMPLICATIONS: None  CONDITION: Stable  Leotis Pain  08/11/2016, 11:22 AM   This note was created with Dragon Medical transcription system. Any errors in dictation are purely unintentional.

## 2016-08-11 NOTE — Progress Notes (Signed)
[facility] [place] NAME: Cory Hardin    MR#:  [id number]  DATE OF BIRTH:  October 30, 1945  SUBJECTIVE:   Patient still has right-sided pleuritic chest pain and SOB, on O2 [place] 2 L.  REVIEW OF SYSTEMS:    Review of Systems  Constitutional: Positive for malaise/fatigue. Negative for chills and fever.  HENT: Negative.  Negative for ear discharge, ear pain, hearing loss, nosebleeds and sore throat.   Eyes: Negative.  Negative for blurred vision and pain.  Respiratory: Positive for shortness of breath. Negative for cough, hemoptysis and wheezing.   Cardiovascular: Positive for chest pain. Negative for palpitations and leg swelling.  Gastrointestinal: Negative.  Negative for abdominal pain, blood in stool, diarrhea, nausea and vomiting.  Genitourinary: Negative.  Negative for dysuria.  Musculoskeletal: Negative.  Negative for back pain.  Skin: Negative.   Neurological: Negative for dizziness, tremors, speech change, focal weakness, seizures and headaches.  Endo/Heme/Allergies: Negative.  Does not bruise/bleed easily.  Psychiatric/Behavioral: Negative.  Negative for depression, hallucinations and suicidal ideas.    Tolerating Diet: yes DRUG ALLERGIES:  No Known Allergies  VITALS:  Blood pressure (!) 149/81, pulse 60, temperature 98.1 F (36.7 C), temperature source Oral, resp. rate (!) 23, height 6' (1.829 m), weight 200 lb (90.7 kg), SpO2 92 %.  PHYSICAL EXAMINATION:   Physical Exam  Constitutional: He is oriented to person, place, and time and well-developed, well-nourished, and in no distress.  HENT:  Head: Normocephalic.  Mouth/Throat: Oropharynx is clear and moist.  Eyes: Conjunctivae and EOM are normal.  Neck: Normal range of motion. Neck supple. No JVD present. No tracheal deviation present.  Cardiovascular: Normal rate, regular rhythm and normal heart sounds.  Exam reveals no gallop.   No murmur heard. Pulmonary/Chest: Effort normal  and breath sounds normal. No respiratory distress. He has no wheezes. He has no rales.  Abdominal: Soft. Bowel sounds are normal. He exhibits no distension. There is no tenderness.  Musculoskeletal: Normal range of motion. He exhibits no edema or tenderness.  Neurological: He is alert and oriented to person, place, and time. No cranial nerve deficit.  Skin: No rash noted. No erythema.  Psychiatric: Affect and judgment normal.      LABORATORY PANEL:   CBC  Recent Labs Lab 08/11/16 0007  WBC 9.3  HGB 13.5  HCT 38.6*  PLT 260   ------------------------------------------------------------------------------------------------------------------  Chemistries   Recent Labs Lab 08/09/16 1432  08/11/16 0007  NA 138  < > 137  K 4.4  < > 3.2*  CL 101  < > 102  CO2 29  < > 26  GLUCOSE 144*  < > 163*  BUN 31*  < > 30*  CREATININE 1.61*  < > 1.30*  CALCIUM 9.3  < > 8.7*  MG  --   --  2.0  AST 31  --   --   ALT 23  --   --   ALKPHOS 84  --   --   BILITOT 1.1  --   --   < > = values in this interval not displayed. ------------------------------------------------------------------------------------------------------------------  Cardiac Enzymes  Recent Labs Lab 08/09/16 1432 08/09/16 1843  TROPONINI <0.03 <0.03   ------------------------------------------------------------------------------------------------------------------  RADIOLOGY:  Ct Angio Chest Pe W And/or Wo Contrast  Result Date: 08/09/2016 CLINICAL DATA:  Severe right chest pain, back cough EXAM: CT ANGIOGRAPHY CHEST WITH CONTRAST TECHNIQUE: Multidetector CT imaging of the chest was performed using the standard protocol during bolus administration  of intravenous contrast. Multiplanar CT image reconstructions and MIPs were obtained to evaluate the vascular anatomy. CONTRAST:  60 mL Isovue 370 COMPARISON:  None. FINDINGS: Cardiovascular: Satisfactory opacification of the pulmonary arteries to the segmental level.  Acute pulmonary embolus in the lobar, segmental and subsegmental branches of the right lower lobe. Normal heart size. No pericardial effusion. Mediastinum/Nodes: No enlarged mediastinal, hilar, or axillary lymph nodes. Thyroid gland, trachea, and esophagus demonstrate no significant findings. Lungs/Pleura: Small right pleural effusion. Right lower lobe airspace disease likely reflecting atelectasis versus infarct. Left lower lobe airspace disease likely reflecting atelectasis. Upper Abdomen: No acute upper abdominal abnormality. Cholelithiasis. Small hiatal hernia. Musculoskeletal: No acute osseous abnormality. No lytic or sclerotic osseous lesion. Review of the MIP images confirms the above findings. IMPRESSION: 1. Acute pulmonary embolus involving the lobar, segmental and subsegmental branches of the right lower lobe. Positive for acute PE with CT evidence of right heart strain (RV/LV Ratio = 1.2) consistent with at least submassive (intermediate risk) PE. The presence of right heart strain has been associated with an increased risk of morbidity and mortality. Please activate Code PE by paging [phone]. 2. Small right pleural effusion. Right lower lobe airspace disease likely reflecting atelectasis versus infarct. 3. Cholelithiasis. Critical Value/emergent results were called by telephone at the time of interpretation on 08/09/2016 at 7:26 pm to Dr. Conni Slipper , who verbally acknowledged these results. Electronically Signed   By: Kathreen Devoid   On: 08/09/2016 19:26   US Venous Img Lower Bilateral  Result Date: 08/10/2016 CLINICAL DATA:  History of pulmonary embolism. History of prior DVT (02/09/2016). Evaluate for acute or chronic DVT. EXAM: BILATERAL LOWER EXTREMITY VENOUS DOPPLER ULTRASOUND TECHNIQUE: Gray-scale sonography with graded compression, as well as color Doppler and duplex ultrasound were performed to evaluate the lower extremity deep venous systems from the level of the common femoral vein and  including the common femoral, femoral, profunda femoral, popliteal and calf veins including the posterior tibial, peroneal and gastrocnemius veins when visible. The superficial great saphenous vein was also interrogated. Spectral Doppler was utilized to evaluate flow at rest and with distal augmentation maneuvers in the common femoral, femoral and popliteal veins. COMPARISON:  Right lower extremity venous Doppler ultrasound - 02/09/2016 FINDINGS: RIGHT LOWER EXTREMITY Saphenofemoral Junction: No evidence of thrombus. Normal compressibility and flow on color Doppler imaging. Profunda Femoral Vein: No evidence of thrombus. Normal compressibility and flow on color Doppler imaging. There is mixed echogenic nonocclusive thrombus within the right common femoral (images 2 and 3) veins, progressed since the 02/09/2016 examination. There is mixed echogenic mixed occlusive and nonocclusive thrombus within the proximal (image 14), mid (image 19) and distal (image 22) aspects of the right femoral vein, similar to the 02/2016. There is mixed echogenic near occlusive thrombus within the imaged course of the right popliteal vein (image 25, improved compared to the 02/09/2016 examination. Other Findings:  None. LEFT LOWER EXTREMITY Common Femoral Vein: No evidence of thrombus. Normal compressibility, respiratory phasicity and response to augmentation. Saphenofemoral Junction: No evidence of thrombus. Normal compressibility and flow on color Doppler imaging. Profunda Femoral Vein: No evidence of thrombus. Normal compressibility and flow on color Doppler imaging. Femoral Vein: No evidence of thrombus. Normal compressibility, respiratory phasicity and response to augmentation. Popliteal Vein: No evidence of thrombus. Normal compressibility, respiratory phasicity and response to augmentation. Calf Veins: No evidence of thrombus. Normal compressibility and flow on color Doppler imaging. Superficial Great Saphenous Vein: No evidence of  thrombus. Normal compressibility and flow on color  Doppler imaging. Venous Reflux:  None. Other Findings:  None. IMPRESSION: 1. Suspected acute on chronic nonocclusive DVT involving the right common femoral vein. 2. Unchanged to minimally improved appearance of mixed occlusive and nonocclusive DVT involving the right superficial femoral and popliteal veins. 3. No evidence of acute or chronic DVT within the left lower extremity. Electronically Signed   By: Sandi Mariscal M.D.   On: 08/10/2016 10:41     ASSESSMENT AND PLAN:    71 year old male with a past history of DVT who presented with shortness of breath and found to have right lower lobe submassive pulmonary emboli with right heart strain on CT scan.  1. Acute submassive pulmonary emboli with possible pulmonary infarct/Right heart strain: Continue heparin drip, echocardiogram: Systolic function was normal. The estimated ejection fraction was in the range of 60% to 65%. S/p IVC filter placement today.  Mechanical thrombectomy and Catheter directed thrombolytic therapy today per Dr. Lucky Cowboy.  Need Eliquis/xarelto start tomorrow per Dr. Raul Del.  * Acute respiratory failure with hypoxia due to PE and possible underlying COPD. Continue oxygen [place], wean as tolerated. NEB.  2. Diabetes: Continue sliding scale insulin, ADA diet and Amaryl  3 chronic kidney disease stage III: Creatinine at baseline  4. Hyperlipidemia: Continue fenofibrate and pravastatin  5. Essential hypertension: Continue HCTZ and Coreg  Hypokalemia. Give potassium supplement, magnesium level is normal.  I discussed with Dr. Raul Del.  Management plans discussed with the patient, his sister and they are in agreement.  CODE STATUS: full  TOTAL TIME TAKING CARE OF THIS PATIENT: 42 minutes.   POSSIBLE D/C 2 days, DEPENDING ON CLINICAL CONDITION.   Demetrios Loll M.D on 08/11/2016 at 4:47 PM  Between 7am to 6pm - Pager - [phone] After 6pm go to [email or web address] - password [email or web address]  [facility]  Office  [phone]  CC: Primary care physician; Albina Billet, MD  Note: This dictation was prepared with Dragon dictation along with smaller phrase technology. Any transcriptional errors that result from this process are unintentional.

## 2016-08-11 NOTE — Progress Notes (Signed)
PAD removed from left groin. Site is free of hematoma and no bleeding noted at site.  Bandaid placed on site

## 2016-08-11 NOTE — Consult Note (Signed)
[facility] Vascular Consult Note  MRN : [id number]  Cory Hardin is a 71 y.o. (December 23, 1945) male who presents with chief complaint of  Chief Complaint  Patient presents with  . Cough  . Shortness of Breath  .  History of Present Illness: I am asked to see the patient by Dr. 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 The patient had previously seen me in the office in about 2 months ago had a chronic appearing DVT in the right lower extremity. He was doing well and his leg did not have any significant increase in pain or swelling. Approximately 1 day prior to admission he began having a coughing spell and severe shortness of breath. He had pleuritic chest pain in the right lower and lateral chest area. He could not get a sentence out without becoming short of breath. He was significantly hypoxic and remains marginally hypoxic on 2 L of oxygen. 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 CT scan which I have independently reviewed demonstrates segmental and subsegmental right sided pulmonary embolus with likely some pulmonary infarction present and I would agree with the interpretation of possible right heart strain. He has had no recent immobility, surgery, trauma, or injury that was an inciting event. Despite almost 24 hours of anticoagulation he remains highly symptomatic.  Current Facility-Administered Medications  Medication Dose Route Frequency Provider Last Rate Last Dose  . 0.9 %  sodium chloride infusion   Intravenous Continuous Demetrios Loll, MD 75 mL/hr at 08/11/16 1033    . acetaminophen (TYLENOL) tablet 650 mg  650 mg Oral Q6H PRN Loletha Grayer, MD   650 mg at 08/10/16 1753   Or  . acetaminophen (TYLENOL) suppository 650 mg  650 mg Rectal Q6H PRN Loletha Grayer, MD      . carvedilol (COREG) tablet 12.5 mg  12.5 mg Oral BID WC Loletha Grayer, MD   12.5 mg  at 08/10/16 1750  . fenofibrate tablet 160 mg  160 mg Oral Daily Loletha Grayer, MD   160 mg at 08/10/16 1037  . heparin ADULT infusion 100 units/mL (25000 units/227mL sodium chloride 0.45%)  1,600 Units/hr Intravenous Continuous Sheema M Hallaji, RPH 16 mL/hr at 08/10/16 2329 1,600 Units/hr at 08/10/16 2329  . hydrochlorothiazide (HYDRODIURIL) tablet 25 mg  25 mg Oral Daily Loletha Grayer, MD   25 mg at 08/10/16 1037  . HYDROmorphone (DILAUDID) injection 1 mg  1 mg Intravenous Once Algernon Huxley, MD      . insulin aspart (novoLOG) injection 0-5 Units  0-5 Units Subcutaneous QHS Loletha Grayer, MD      . insulin aspart (novoLOG) injection 0-9 Units  0-9 Units Subcutaneous TID WC Loletha Grayer, MD   2 Units at 08/10/16 1150  . oxyCODONE (Oxy IR/ROXICODONE) immediate release tablet 5 mg  5 mg Oral Q4H PRN Erby Pian, MD      . pantoprazole (PROTONIX) EC tablet 40 mg  40 mg Oral Daily Loletha Grayer, MD   40 mg at 08/10/16 1038  . pravastatin (PRAVACHOL) tablet 40 mg  40 mg Oral Daily Loletha Grayer, MD   40 mg at 08/10/16 1037    Past Medical History:  Diagnosis Date  . Bilateral kidney stones   . BPH (benign prostatic hyperplasia)   . Cancer ([place])    bladder  . 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  Chronic kidney disease    stones,renal failure  . Diabetes mellitus without complication ([place])   . DVT (deep venous thrombosis) ([place])   . GERD (gastroesophageal reflux disease)   . Hematuria   . Hyperlipidemia   . Hypertension     Past Surgical History:  Procedure Laterality Date  . APPENDECTOMY    . bladder stones    . CYSTOSCOPY    . CYSTOSCOPY WITH BIOPSY N/A 11/20/2014   Procedure: CYSTOSCOPY WITH BIOPSY;  Surgeon: [name], MD;  Location: [facility] ORS;  Service: Urology;  Laterality: N/A;  . CYSTOSCOPY WITH STENT PLACEMENT Right 06/23/2015   Procedure: CYSTOSCOPY WITH STENT PLACEMENT;  Surgeon: [name], MD;  Location: [facility] ORS;  Service:  Urology;  Laterality: Right;  . PERCUTANEOUS NEPHROLITHOTRIPSY    . TRANSURETHRAL RESECTION OF BLADDER TUMOR N/A 06/23/2015   Procedure: TRANSURETHRAL RESECTION OF BLADDER TUMOR (TURBT);  Surgeon: [name], MD;  Location: [facility] ORS;  Service: Urology;  Laterality: N/A;  . TRANSURETHRAL RESECTION OF BLADDER TUMOR WITH GYRUS (TURBT-GYRUS)    . URETEROSCOPY WITH HOLMIUM LASER LITHOTRIPSY Right 06/23/2015   Procedure: URETEROSCOPY WITH HOLMIUM LASER LITHOTRIPSY;  Surgeon: [name], MD;  Location: [facility] ORS;  Service: Urology;  Laterality: Right;    Social History Social History  Substance Use Topics  . Smoking status: Former Smoker    Packs/day: 0.25    Years: 15.00    Types: Cigarettes    Quit date: 11/13/1982  . Smokeless tobacco: Never Used  . Alcohol use 0.6 oz/week    1 Standard drinks or equivalent per week     Comment: rare  No IV drug use  Family History Family History  Problem Relation Age of Onset  . Heart disease Father   . Heart disease Mother   . Heart failure Mother   . COPD Mother   . Cancer Maternal Aunt   . Heart disease Brother   . Diabetes Brother   . Kidney disease Neg Hx   . Prostate cancer Neg Hx     No Known Allergies   REVIEW OF SYSTEMS (Negative unless checked)  Constitutional: [] Weight loss  [] Fever  [] Chills Cardiac: [] Chest pain   [] Chest pressure   [] Palpitations   [x] Shortness of breath when laying flat   [x] Shortness of breath at rest   [] Shortness of breath with exertion. Vascular:  [] Pain in legs with walking   [] Pain in legs at rest   [] Pain in legs when laying flat   [] Claudication   [] Pain in feet when walking  [] Pain in feet at rest  [] Pain in feet when laying flat   [x] History of DVT   [x] Phlebitis   [x] Swelling in legs   [] Varicose veins   [] Non-healing ulcers Pulmonary:   [] Uses home oxygen   [x] Productive cough   [] Hemoptysis   [x] Wheeze  [] COPD   [] Asthma Neurologic:  [] Dizziness  [] Blackouts   [] Seizures   [] History of  stroke   [] History of TIA  [] Aphasia   [] Temporary blindness   [] Dysphagia   [] Weakness or numbness in arms   [] Weakness or numbness in legs Musculoskeletal:  [] Arthritis   [] Joint swelling   [] Joint pain   [] Low back pain Hematologic:  [] Easy bruising  [] Easy bleeding   [] Hypercoagulable state   [] Anemic  [] Hepatitis Gastrointestinal:  [] Blood in stool   [] Vomiting blood  [] Gastroesophageal reflux/heartburn   [] Difficulty swallowing. Genitourinary:  [] Chronic kidney disease   [] Difficult urination  [] Frequent urination  [] Burning with urination   [] Blood in urine Skin:  []   Rashes   [] Ulcers   [] Wounds Psychological:  [] History of anxiety   []  History of major depression.  Physical Examination  Vitals:   08/11/16 0339 08/11/16 0724 08/11/16 0910 08/11/16 1015  BP: 117/71 (!) 148/57  (!) 157/83  Pulse: (!) 59 62 (!) 58 62  Resp: (!) 22 16  (!) 23  Temp: 97.8 F (36.6 C) 98 F (36.7 C)  98.1 F (36.7 C)  TempSrc: Oral Oral  Oral  SpO2: 92% 91%  93%  Weight:      Height:       Body mass index is 27.12 kg/m. Gen:  WD/WN, Patient is somewhat labored and his respirations and appears quite uncomfortable Head: /AT, No temporalis wasting. Prominent temp pulse not noted. Ear/Nose/Throat: Hearing grossly intact, nares w/o erythema or drainage, oropharynx w/o Erythema/Exudate Eyes: Sclera non-icteric, conjunctiva clear Neck: Trachea midline.  No JVD.  Pulmonary:  Good air movement, respirations labored, using accessory muscles  Cardiac: RRR, normal S1, S2. Vascular:  Vessel Right Left  Radial Palpable Palpable                                   Gastrointestinal: soft, non-tender/non-distended. No guarding/reflex.  Musculoskeletal: M/S 5/5 throughout.  Extremities without ischemic changes.  No deformity or atrophy. 1+ right lower extremity edema. No left lower extremity edema Neurologic: Sensation grossly intact in extremities.  Symmetrical.  Speech is fluent. Motor exam as listed  above. Psychiatric: Judgment intact, Mood & affect appropriate for pt's clinical situation. Dermatologic: No rashes or ulcers noted.  No cellulitis or open wounds. Lymph : No Cervical, Axillary, or Inguinal lymphadenopathy.      CBC Lab Results  Component Value Date   WBC 9.3 08/11/2016   HGB 13.5 08/11/2016   HCT 38.6 (L) 08/11/2016   MCV 90.8 08/11/2016   PLT 260 08/11/2016    BMET    Component Value Date/Time   NA 137 08/11/2016 0007   NA 141 08/01/2014 1146   K 3.2 (L) 08/11/2016 0007   K 4.1 08/01/2014 1146   CL 102 08/11/2016 0007   CL 105 08/01/2014 1146   CO2 26 08/11/2016 0007   CO2 28 08/01/2014 1146   GLUCOSE 163 (H) 08/11/2016 0007   GLUCOSE 171 (H) 08/01/2014 1146   BUN 30 (H) 08/11/2016 0007   BUN 25 (H) 08/01/2014 1146   CREATININE 1.30 (H) 08/11/2016 0007   CREATININE 1.41 (H) 08/01/2014 1146   CALCIUM 8.7 (L) 08/11/2016 0007   CALCIUM 9.1 08/01/2014 1146   GFRNONAA 54 (L) 08/11/2016 0007   GFRNONAA 53 (L) 08/01/2014 1146   GFRNONAA 57 (L) 03/08/2014 0510   GFRAA >60 08/11/2016 0007   GFRAA >60 08/01/2014 1146   GFRAA >60 03/08/2014 0510   Estimated Creatinine Clearance: 57.2 mL/min (by C-G formula based on SCr of 1.3 mg/dL (H)).  COAG Lab Results  Component Value Date   INR 1.51 08/09/2016   INR 1.39 02/09/2016   INR 1.4 03/05/2014    Radiology Dg Chest 2 View  Result Date: 08/09/2016 CLINICAL DATA:  Sick with cough and congestion EXAM: CHEST  2 VIEW COMPARISON:  05/23/2001 CXR report FINDINGS: Heart is top normal. No aortic aneurysm. There is mild aortic atherosclerosis. Low lung volumes with slight elevation of right hemidiaphragm. Bibasilar atelectasis and/or scarring is noted. Trace fluid within the right major fissure. No pulmonary consolidation or CHF. No acute osseous abnormality. Mild degenerative changes along  the dorsal spine. IMPRESSION: No active cardiopulmonary disease. Bibasilar atelectasis and/or scarring. Trace fluid along the  right major fissure. Electronically Signed   By: Ashley Royalty M.D.   On: 08/09/2016 15:23   Dg Abd 1 View  Result Date: 07/28/2016 CLINICAL DATA:  Kidney stones. EXAM: ABDOMEN - 1 VIEW COMPARISON:  01/16/2016 .  06/09/2015.  CT 03/05/2014 . FINDINGS: Soft tissue structures are unremarkable. Right upper quadrant calcifications consistent with gallstones again noted . Bilateral nephrolithiasis. Renal stones appears stable from prior exam. Faint pelvic calcifications cannot be excluded. Overlying bowel stool makes evaluation of the pelvis difficult. Aortoiliac atherosclerotic vascular calcification. Degenerative changes lumbar spine and both hips. IMPRESSION: Stable bilateral nephrolithiasis. Electronically Signed   By: Marcello Moores  Register   On: 07/28/2016 08:52   Ct Angio Chest Pe W And/or Wo Contrast  Result Date: 08/09/2016 CLINICAL DATA:  Severe right chest pain, back cough EXAM: CT ANGIOGRAPHY CHEST WITH CONTRAST TECHNIQUE: Multidetector CT imaging of the chest was performed using the standard protocol during bolus administration of intravenous contrast. Multiplanar CT image reconstructions and MIPs were obtained to evaluate the vascular anatomy. CONTRAST:  60 mL Isovue 370 COMPARISON:  None. FINDINGS: Cardiovascular: Satisfactory opacification of the pulmonary arteries to the segmental level. Acute pulmonary embolus in the lobar, segmental and subsegmental branches of the right lower lobe. Normal heart size. No pericardial effusion. Mediastinum/Nodes: No enlarged mediastinal, hilar, or axillary lymph nodes. Thyroid gland, trachea, and esophagus demonstrate no significant findings. Lungs/Pleura: Small right pleural effusion. Right lower lobe airspace disease likely reflecting atelectasis versus infarct. Left lower lobe airspace disease likely reflecting atelectasis. Upper Abdomen: No acute upper abdominal abnormality. Cholelithiasis. Small hiatal hernia. Musculoskeletal: No acute osseous abnormality. No lytic  or sclerotic osseous lesion. Review of the MIP images confirms the above findings. IMPRESSION: 1. Acute pulmonary embolus involving the lobar, segmental and subsegmental branches of the right lower lobe. Positive for acute PE with CT evidence of right heart strain (RV/LV Ratio = 1.2) consistent with at least submassive (intermediate risk) PE. The presence of right heart strain has been associated with an increased risk of morbidity and mortality. Please activate Code PE by paging [phone]. 2. Small right pleural effusion. Right lower lobe airspace disease likely reflecting atelectasis versus infarct. 3. Cholelithiasis. Critical Value/emergent results were called by telephone at the time of interpretation on 08/09/2016 at 7:26 pm to Dr. Conni Slipper , who verbally acknowledged these results. Electronically Signed   By: Kathreen Devoid   On: 08/09/2016 19:26   US Venous Img Lower Bilateral  Result Date: 08/10/2016 CLINICAL DATA:  History of pulmonary embolism. History of prior DVT (02/09/2016). Evaluate for acute or chronic DVT. EXAM: BILATERAL LOWER EXTREMITY VENOUS DOPPLER ULTRASOUND TECHNIQUE: Gray-scale sonography with graded compression, as well as color Doppler and duplex ultrasound were performed to evaluate the lower extremity deep venous systems from the level of the common femoral vein and including the common femoral, femoral, profunda femoral, popliteal and calf veins including the posterior tibial, peroneal and gastrocnemius veins when visible. The superficial great saphenous vein was also interrogated. Spectral Doppler was utilized to evaluate flow at rest and with distal augmentation maneuvers in the common femoral, femoral and popliteal veins. COMPARISON:  Right lower extremity venous Doppler ultrasound - 02/09/2016 FINDINGS: RIGHT LOWER EXTREMITY Saphenofemoral Junction: No evidence of thrombus. Normal compressibility and flow on color Doppler imaging. Profunda Femoral Vein: No evidence of thrombus.  Normal compressibility and flow on color Doppler imaging. There is mixed echogenic nonocclusive thrombus within  the right common femoral (images 2 and 3) veins, progressed since the 02/09/2016 examination. There is mixed echogenic mixed occlusive and nonocclusive thrombus within the proximal (image 14), mid (image 19) and distal (image 22) aspects of the right femoral vein, similar to the 02/2016. There is mixed echogenic near occlusive thrombus within the imaged course of the right popliteal vein (image 25, improved compared to the 02/09/2016 examination. Other Findings:  None. LEFT LOWER EXTREMITY Common Femoral Vein: No evidence of thrombus. Normal compressibility, respiratory phasicity and response to augmentation. Saphenofemoral Junction: No evidence of thrombus. Normal compressibility and flow on color Doppler imaging. Profunda Femoral Vein: No evidence of thrombus. Normal compressibility and flow on color Doppler imaging. Femoral Vein: No evidence of thrombus. Normal compressibility, respiratory phasicity and response to augmentation. Popliteal Vein: No evidence of thrombus. Normal compressibility, respiratory phasicity and response to augmentation. Calf Veins: No evidence of thrombus. Normal compressibility and flow on color Doppler imaging. Superficial Great Saphenous Vein: No evidence of thrombus. Normal compressibility and flow on color Doppler imaging. Venous Reflux:  None. Other Findings:  None. IMPRESSION: 1. Suspected acute on chronic nonocclusive DVT involving the right common femoral vein. 2. Unchanged to minimally improved appearance of mixed occlusive and nonocclusive DVT involving the right superficial femoral and popliteal veins. 3. No evidence of acute or chronic DVT within the left lower extremity. Electronically Signed   By: Sandi Mariscal M.D.   On: 08/10/2016 10:41      Assessment/Plan 1. Acute on chronic DVT right lower extremity. Extensive DVT remains present. Also with right sided  pulmonary embolus which is highly symptomatic. Would agree with IVC filter placement at this point to avoid further embolization with extensive residual DVT. On heparin and showed go home on full anticoagulation likely indefinitely at this point.  2. Right-sided pulmonary embolus. Highly symptomatic. There is appearance of right heart strain on the CT scan. Have discussed the case with Dr. Raul Del. Will discuss with the patient further potentially performing pulmonary artery thrombectomy percutaneous as well as administration of some TPA in the pulmonary artery to debulk the pulmonary embolus to hopefully improve his symptoms. 3. Bladder cancer. May be the cause of his hypercoagulable state. 4. Diabetes. blood glucose control important in reducing the progression of atherosclerotic disease. Also, involved in wound healing. On appropriate medications. 5. Hypertension. Stable outpatient medications. blood pressure control important in reducing the progression of atherosclerotic disease. On appropriate oral medications.    Leotis Pain, MD  08/11/2016 10:37 AM    This note was created with Dragon medical transcription system.  Any error is purely unintentional

## 2016-08-11 NOTE — Care Management Important Message (Signed)
Important Message  Patient Details  Name: Cory Hardin MRN: [id number] Date of Birth: May 18, 1946   Medicare Important Message Given:  Yes Initial signed copy given   Jolly Mango, RN 08/11/2016, 10:27 AM

## 2016-08-11 NOTE — Progress Notes (Addendum)
Anticoagulation Note:  Patient now for possible pulmonary artery thrombectomy per Vascular. Per Discussion with Dr. Bridgett Larsson and Floor RN- will discontinue Xarelto orders and continue Heparin Drip at this time. Xarelto has not been given per RN  Chinita Greenland PharmD Clinical Pharmacist 08/11/2016

## 2016-08-11 NOTE — Progress Notes (Signed)
Date: 08/11/2016,   MRN# [id number] Cory Hardin Jul 31, 1945 Code Status:     Code Status Orders        Start     Ordered   08/09/16 2044  Full code  Continuous     08/09/16 2044    Code Status History    Date Active Date Inactive Code Status Order ID Comments User Context   This patient has a current code status but no historical code status.     Hosp day:@LENGTHOFSTAYDAYS @ Referring MD: @ATDPROV @      HPI:  Still quite dyspneic with right pleuritic pain. Appreciate vascular input. B/p and oxygenation on 2 liters o2 is holding.   PMHX:   Past Medical History:  Diagnosis Date  . Bilateral kidney stones   . BPH (benign prostatic hyperplasia)   . Cancer ([place])    bladder  . 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 Chronic kidney disease    stones,renal failure  . Diabetes mellitus without complication ([place])   . DVT (deep venous thrombosis) ([place])   . GERD (gastroesophageal reflux disease)   . Hematuria   . Hyperlipidemia   . Hypertension    Surgical Hx:  Past Surgical History:  Procedure Laterality Date  . APPENDECTOMY    . bladder stones    . CYSTOSCOPY    . CYSTOSCOPY WITH BIOPSY N/A 11/20/2014   Procedure: CYSTOSCOPY WITH BIOPSY;  Surgeon: [name], MD;  Location: [facility] ORS;  Service: Urology;  Laterality: N/A;  . CYSTOSCOPY WITH STENT PLACEMENT Right 06/23/2015   Procedure: CYSTOSCOPY WITH STENT PLACEMENT;  Surgeon: [name], MD;  Location: [facility];  Service: Urology;  Laterality: Right;  . PERCUTANEOUS NEPHROLITHOTRIPSY    . TRANSURETHRAL RESECTION OF BLADDER TUMOR N/A 06/23/2015   Procedure: TRANSURETHRAL RESECTION OF BLADDER TUMOR (TURBT);  Surgeon: [name], MD;  Location: [facility];  Service: Urology;  Laterality: N/A;  . TRANSURETHRAL RESECTION OF BLADDER TUMOR WITH GYRUS (TURBT-GYRUS)    . URETEROSCOPY WITH HOLMIUM LASER LITHOTRIPSY Right 06/23/2015   Procedure: URETEROSCOPY WITH HOLMIUM LASER LITHOTRIPSY;  Surgeon: [name], MD;  Location: [facility];  Service: Urology;  Laterality: Right;   Family Hx:  Family History  Problem Relation Age of Onset  . Heart disease Father   . Heart disease Mother   . Heart failure Mother   . COPD Mother   . Cancer Maternal Aunt   . Heart disease Brother   . Diabetes Brother   . Kidney disease Neg Hx   . Prostate cancer Neg Hx    Social Hx:   Social History  Substance Use Topics  . Smoking status: Former Smoker    Packs/day: 0.25    Years: 15.00    Types: Cigarettes    Quit date: 11/13/1982  . Smokeless tobacco: Never Used  . Alcohol use 0.6 oz/week    1 Standard drinks or equivalent per week     Comment: rare   Medication:    Home Medication:    Current Medication: @CURMEDTAB @   Allergies:  Patient has no known allergies.  Review of Systems: Gen:  Denies  fever, sweats, chills HEENT: Denies blurred vision, double vision, ear pain, eye pain, hearing loss, nose bleeds, sore throat Cvc:  No dizziness, chest pain or heaviness Resp: Still sob, + ve right pleuritic pain, no hemoptysis.      Gi: Denies swallowing difficulty, stomach pain, nausea or vomiting, diarrhea, constipation, bowel incontinence Gu:  Denies bladder incontinence, burning  urine Ext:   No Joint pain, stiffness or swelling Skin: No skin rash, easy bruising or bleeding or hives Endoc:  No polyuria, polydipsia , polyphagia or weight change Psych: No depression, insomnia or hallucinations  Other:  All other systems negative  Physical Examination:   VS: BP (!) 150/80 (BP Location: Right Arm)   Pulse 60   Temp 98.1 F (36.7 C) (Oral)   Resp 12   Ht 6' (1.829 m)   Wt 200 lb (90.7 kg)   SpO2 96%   BMI 27.12 kg/m   General Appearance: No distress less painful Neuro: without focal findings, mental status, speech normal, alert and oriented, cranial nerves 2-12 intact, reflexes normal and symmetric, sensation grossly normal  HEENT: PERRLA, EOM intact, no ptosis, no other lesions  noticed, Mallampati: Pulmonary:.No wheezing, No rales    Cardiovascular:  Normal S1,S2.  No m/r/g.  Abdominal aorta pulsation normal.    Abdomen:Benign, Soft, non-tender, No masses, hepatosplenomegaly, No lymphadenopathy Endoc: No evident thyromegaly, no signs of acromegaly or Cushing features Skin:   warm, no rashes, no ecchymosis  Extremities: normal, no cyanosis, clubbing, no edema, warm with normal capillary refill. Other findings:   Labs results:   Recent Labs     08/09/16  1432  08/09/16  2054  08/10/16  0419  08/11/16  0007  HGB  14.6   --   12.9*  13.5  HCT  41.2   --   37.7*  38.6*  MCV  91.2   --   92.1  90.8  WBC  11.2*   --   9.4  9.3  BUN  31*   --   28*  30*  CREATININE  1.61*   --   1.29*  1.30*  GLUCOSE  144*   --   150*  163*  CALCIUM  9.3   --   8.6*  8.7*  INR   --   1.51   --    --   ,     Assessment and Plan: Right sub acute pulmonary embolism, right DVT. There is right heart strain. Thus far he is maintaining good blood pressure. On heparin. On two liters [place] 02 .  -filter placed -intrapulmonary thrombolytic today -continue heparin -start op anticoagulation tomorrow, ? Eliquis -out patient f/u with pulmonary .  Suspect underlying copd which may explain some of his relative hypoxia. -wean o2 as tolerated -prn albuterol -out patient pfts -following     I have personally obtained a history, examined the patient, evaluated laboratory and imaging results, formulated the assessment and plan and placed orders.  The Patient requires high complexity decision making for assessment and support, frequent evaluation and titration of therapies, application of advanced monitoring technologies and extensive interpretation of multiple databases.   Shamira Toutant,M.D. Pulmonary & Critical care Medicine [facility]

## 2016-08-11 NOTE — H&P (Signed)
[place] [facility] History & Physical Update  The patient was interviewed and re-examined.  The patient's previous History and Physical has been reviewed and is unchanged.  Given his significant residual symptoms with large PE with right heart strain, we are going to perform pulmonary thrombectomy/thrombolysis to try to debulk the clot in the right pulmonary artery. We plan to proceed with the scheduled procedure.  Leotis Pain, MD  08/11/2016, 2:43 PM

## 2016-08-11 NOTE — Progress Notes (Signed)
ANTICOAGULATION CONSULT NOTE - Initial Consult  Pharmacy Consult for Heparin Drip  Indication: pulmonary embolus  No Known Allergies  Patient Measurements: Height: 6' (182.9 cm) Weight: 200 lb (90.7 kg) IBW/kg (Calculated) : 77.6  Vital Signs: Temp: 98.4 F (36.9 C) (02/06 2321) Temp Source: Oral (02/06 2321) BP: 138/73 (02/06 2321) Pulse Rate: 62 (02/06 2321)  Labs:  Recent Labs  08/09/16 1432 08/09/16 1843 08/09/16 2054 08/10/16 0419 08/10/16 1500 08/11/16 0006 08/11/16 0007  HGB 14.6  --   --  12.9*  --   --  13.5  HCT 41.2  --   --  37.7*  --   --  38.6*  PLT 239  --   --  207  --   --  260  APTT  --   --  32  --   --   --   --   LABPROT  --   --  18.4*  --   --   --   --   INR  --   --  1.51  --   --   --   --   HEPARINUNFRC  --   --   --  0.22* 0.30 0.34  --   CREATININE 1.61*  --   --  1.29*  --   --  1.30*  TROPONINI <0.03 <0.03  --   --   --   --   --     Estimated Creatinine Clearance: 57.2 mL/min (by C-G formula based on SCr of 1.3 mg/dL (H)).   Medical History: Past Medical History:  Diagnosis Date  . Bilateral kidney stones   . BPH (benign prostatic hyperplasia)   . Cancer ([place])    bladder  . 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 Chronic kidney disease    stones,renal failure  . Diabetes mellitus without complication ([place])   . DVT (deep venous thrombosis) ([place])   . GERD (gastroesophageal reflux disease)   . Hematuria   . Hyperlipidemia   . Hypertension    Assessment: 71 yo male with PMH of bladder cancer found to have PE. Pharmacy consulted for heparin dosing and monitoring.   Goal of Therapy:  Heparin level 0.3-0.7 units/ml Monitor platelets by anticoagulation protocol: Yes   Plan:  Baseline labs ordered.  Give 4000 units bolus x 1 Start heparin infusion at 1400 units/hr Check anti-Xa level in 8 hours and daily while on heparin Continue to monitor H&H and platelets   2/6 AM heparin level 0.22. 1200 unit  bolus and increase rate to 1550 units/hr. Recheck in 8 hours.  2/6 1500 HL 0.30. Level is right at therapeutic. Will increase infusion to 1600 units/hr and recheck HL in 8 hours. If level remains therapeutic, will move to check HL daily with AM labs. CBC ordered with AM labs.   2/7 00:00 heparin level 0.34. Continue current regimen. Recheck heparin level and CBC with tomorrow AM labs.  Eloise Harman, PharmD, BCPS Clinical Pharmacist 08/11/2016 1:36 AM

## 2016-08-11 NOTE — Progress Notes (Addendum)
ANTICOAGULATION CONSULT NOTE - Initial Consult  Pharmacy Consult for Xarelto (Rivaroxaban) Indication: pulmonary embolus  No Known Allergies  Patient Measurements: Height: 6' (182.9 cm) Weight: 200 lb (90.7 kg) IBW/kg (Calculated) : 77.6 Heparin Dosing Weight:    Vital Signs: Temp: 98.1 F (36.7 C) (02/07 1015) Temp Source: Oral (02/07 1015) BP: 157/83 (02/07 1015) Pulse Rate: 62 (02/07 1015)  Labs:  Recent Labs  08/09/16 1432 08/09/16 1843 08/09/16 2054 08/10/16 0419 08/10/16 1500 08/11/16 0006 08/11/16 0007  HGB 14.6  --   --  12.9*  --   --  13.5  HCT 41.2  --   --  37.7*  --   --  38.6*  PLT 239  --   --  207  --   --  260  APTT  --   --  32  --   --   --   --   LABPROT  --   --  18.4*  --   --   --   --   INR  --   --  1.51  --   --   --   --   HEPARINUNFRC  --   --   --  0.22* 0.30 0.34  --   CREATININE 1.61*  --   --  1.29*  --   --  1.30*  TROPONINI <0.03 <0.03  --   --   --   --   --     Estimated Creatinine Clearance: 57.2 mL/min (by C-G formula based on SCr of 1.3 mg/dL (H)).   Medical History: Past Medical History:  Diagnosis Date  . Bilateral kidney stones   . BPH (benign prostatic hyperplasia)   . Cancer ([place])    bladder  . 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 Chronic kidney disease    stones,renal failure  . Diabetes mellitus without complication ([place])   . DVT (deep venous thrombosis) ([place])   . GERD (gastroesophageal reflux disease)   . Hematuria   . Hyperlipidemia   . Hypertension     Assessment: 71 yo male with Pulmonary Embolism currently on Heparin drip and to get IVC filter and to start Xarelto.  Hx Bladder Cancer and DVT.   Goal of Therapy:  Monitor platelets by anticoagulation protocol: Yes   Plan:  Will order Xarelto 15 mg po BID With Food x 21 days then  Xarelto 20 mg Q Supper. Heparin drip to be discontinued when Xarelto dose given   ADDENDUM:  Patient now for possible pulmonary artery  thrombectomy per Vascular. Per Discussion with Dr. Bridgett Larsson and Floor RN- will discontinue Xarelto orders and continue Heparin Drip at this time.   Cory Hardin A 08/11/2016,10:40 AM

## 2016-08-11 NOTE — H&P (Signed)
[facility] History & Physical Update  The patient was interviewed and re-examined.  The patient's previous History and Physical has been reviewed and is unchanged.  There is no change in the plan of care. We plan to proceed with the scheduled procedure.  Leotis Pain, MD  08/11/2016, 11:02 AM

## 2016-08-11 NOTE — Op Note (Signed)
[facility]   OPERATIVE NOTE   PRE-OPERATIVE DIAGNOSIS: extensive RLE DVT, large right pulmonary embolus with right heart strain on CT scan and hypoxia  POST-OPERATIVE DIAGNOSIS: same   PROCEDURE: 1. US guidance for vascular access to left femoral vein 2. Catheter placement into segmental right lower pulmonary artery branches from left femoral vein approach  3. IVC gram and pulmonary arteriogram 4. Catheter directed thrombolytic therapy with 8 mg of TPA in the right lower pulmonary artery 5.   Mechanical thrombectomy with the CAT 8 device in the right lower pulmonary artery branch    SURGEON: Leotis Pain, MD  ASSISTANT(S): none  ANESTHESIA: local with moderate conscious sedation for 45 minutes using 2 mg of Versed and 50 mcg of Fentanyl  ESTIMATED BLOOD LOSS: 300 cc  SPECIMEN(S): none  INDICATIONS:  Patient is a 71 y.o. male who presents with right PE with right heart strain. He has already had an IVC filter placed. He remains hypoxic on supplemental oxygen severe chest pain. He is brought in for debulking of his right pulmonary embolus to try to improve his oxygenation and ventilation.  DESCRIPTION: After obtaining full informed written consent, the patient was brought back to the vascular suite and placed supine upon the table.Moderate conscious sedation was administered during a face to face encounter with the patient throughout the procedure with my supervision of the RN administering medicines and monitoring the patient's vital signs, pulse oximetry, telemetry and mental status throughout from the start of the procedure until the patient was taken to the recovery room. After obtaining adequate anesthesia, the patient was prepped and draped in the standard fashion. The left femoral vein was then accessed under US guidance and found to be widely patent. It was accessed without difficulty and a permanent image was recorded.I then advanced a  pigtail catheter into the retrohepatic inferior vena cava. At this level and an inferior venacavogram was performed for delineation of the caval anatomy and to determine the entry into the right atrium and the pulmonary artery outflow tract. This showed a widely patent retrohepatic vena cava with brisk flow into the main pulmonary arteries. More distal imaging was not really visible in the pulmonary arteries on this image. I then placed an 8 French sheath and advanced a JR4 catheter into the right atrium and then out the pulmonary artery outflow tract. I was able selectively cannulate the main right pulmonary artery. A CT scan was known that the pulmonary embolus was in the lower primary pulmonary artery branch and out into subsegmental branches. I then cannulated the lower right pulmonary artery branch perform selective imaging. 8 mg of TPA was then instilled with JR4 catheter and the lower right pulmonary artery branch. This was allowed to dwell for between 15-20 minutes. I then used a Magic torque wire and exchanged for the penumbra mechanical thrombectomy device. I used the large CAT 8 catheter and advanced this into the subsegmental lower lobe pulmonary artery branches on the right. It then was slowly pulled back into the lower right pulmonary artery branch and then into the main pulmonary artery. Approximately 5 passes with the number device were performed to withdraw as much clot as possible. On completion imaging, no large primary pulmonary artery branch thrombus was seen with some diminished flow into the subsegmental lower right pulmonary artery branches but improved from the baseline. On removing the catheter from the body and filtering the effluent returned, a large plug of thrombus was identified with several smaller plugs  seen as well. The patient had good oxygenation during the procedure and remained hemodynamically stable. I then elected to terminate the procedure. The sheath was removed and a  dressing was placed. She was taken to the recovery room in stable condition having tolerated the procedure well.   COMPLICATIONS: None  CONDITION: Stable  Leotis Pain 08/11/2016 4:03 PM

## 2016-08-12 ENCOUNTER — Encounter: Payer: Self-pay | Admitting: Vascular Surgery

## 2016-08-12 DIAGNOSIS — I82401 Acute embolism and thrombosis of unspecified deep veins of right lower extremity: Secondary | ICD-10-CM

## 2016-08-12 DIAGNOSIS — I2699 Other pulmonary embolism without acute cor pulmonale: Secondary | ICD-10-CM

## 2016-08-12 LAB — GLUCOSE, CAPILLARY
Glucose-Capillary: 132 mg/dL — ABNORMAL HIGH (ref 65–99)
Glucose-Capillary: 207 mg/dL — ABNORMAL HIGH (ref 65–99)
Glucose-Capillary: 99 mg/dL (ref 65–99)
Glucose-Capillary: 155 mg/dL — ABNORMAL HIGH (ref 65–99)

## 2016-08-12 LAB — CBC
HCT: 36.3 % — ABNORMAL LOW (ref 40.0–52.0)
Hemoglobin: 12.7 g/dL — ABNORMAL LOW (ref 13.0–18.0)
MCH: 31.9 pg (ref 26.0–34.0)
MCHC: 35 g/dL (ref 32.0–36.0)
MCV: 91.1 fL (ref 80.0–100.0)
Platelets: 284 10*3/uL (ref 150–440)
RBC: 3.98 MIL/uL — ABNORMAL LOW (ref 4.40–5.90)
RDW: 13.2 % (ref 11.5–14.5)
WBC: 8.8 10*3/uL (ref 3.8–10.6)

## 2016-08-12 LAB — BASIC METABOLIC PANEL
Anion gap: 6 (ref 5–15)
BUN: 22 mg/dL — ABNORMAL HIGH (ref 6–20)
CO2: 24 mmol/L (ref 22–32)
Calcium: 8.4 mg/dL — ABNORMAL LOW (ref 8.9–10.3)
Chloride: 107 mmol/L (ref 101–111)
Creatinine, Ser: 1.15 mg/dL (ref 0.61–1.24)
GFR calc Af Amer: >60 mL/min (ref >60)
GFR calc non Af Amer: >60 mL/min (ref >60)
Glucose, Bld: 134 mg/dL — ABNORMAL HIGH (ref 65–99)
Potassium: 3.9 mmol/L (ref 3.5–5.1)
Sodium: 137 mmol/L (ref 135–145)

## 2016-08-12 LAB — HEPARIN LEVEL (UNFRACTIONATED): Heparin Unfractionated: 0.36 IU/mL (ref 0.30–0.70)

## 2016-08-12 MED ORDER — APIXABAN 5 MG PO TABS
10.0000 mg | ORAL_TABLET | Freq: Two times a day (BID) | ORAL | Status: DC
  Administered 2016-08-12 – 2016-08-13 (×3): 10 mg via ORAL
  Filled 2016-08-12 (×3): qty 2

## 2016-08-12 MED ORDER — APIXABAN 5 MG PO TABS: 5.0000 mg | ORAL_TABLET | Freq: Two times a day (BID) | ORAL | Status: DC

## 2016-08-12 NOTE — Progress Notes (Signed)
[facility]  Daily Progress Note   Subjective  - 1 Day Post-Op  Pain  Much better Still SOB although sats appear improved No major events overnight No signs of bleeding  Objective Vitals:   08/11/16 1742 08/11/16 1951 08/12/16 0407 08/12/16 0722  BP: (!) 161/78 133/78 135/81 (!) 150/77  Pulse: 74 70 62 65  Resp: (!) 22 18 19 16   Temp: 98.7 F (37.1 C) 98.3 F (36.8 C) 98.7 F (37.1 C) 98.2 F (36.8 C)  TempSrc: Oral Oral Oral Oral  SpO2: 100% 94% 91% 94%  Weight:      Height:        Intake/Output Summary (Last 24 hours) at 08/12/16 1452 Last data filed at 08/12/16 1420  Gross per 24 hour  Intake          2529.79 ml  Output             1450 ml  Net          1079.79 ml    PULM  Still with some labored respirations  CV  RRR VASC  Access site C/D/I  Laboratory CBC    Component Value Date/Time   WBC 8.8 08/12/2016 0435   HGB 12.7 (L) 08/12/2016 0435   HGB 13.6 08/01/2014 1146   HCT 36.3 (L) 08/12/2016 0435   HCT 41.0 08/01/2014 1146   PLT 284 08/12/2016 0435   PLT 209 08/01/2014 1146    BMET    Component Value Date/Time   NA 137 08/12/2016 0435   NA 141 08/01/2014 1146   K 3.9 08/12/2016 0435   K 4.1 08/01/2014 1146   CL 107 08/12/2016 0435   CL 105 08/01/2014 1146   CO2 24 08/12/2016 0435   CO2 28 08/01/2014 1146   GLUCOSE 134 (H) 08/12/2016 0435   GLUCOSE 171 (H) 08/01/2014 1146   BUN 22 (H) 08/12/2016 0435   BUN 25 (H) 08/01/2014 1146   CREATININE 1.15 08/12/2016 0435   CREATININE 1.41 (H) 08/01/2014 1146   CALCIUM 8.4 (L) 08/12/2016 0435   CALCIUM 9.1 08/01/2014 1146   GFRNONAA >60 08/12/2016 0435   GFRNONAA 53 (L) 08/01/2014 1146   GFRNONAA 57 (L) 03/08/2014 0510   GFRAA >60 08/12/2016 0435   GFRAA >60 08/01/2014 1146   GFRAA >60 03/08/2014 0510    Assessment/Planning: POD #1 s/p IVC filter and thrombectomy/thrombolysis of right sided PE   Subjectively better.  Continue anticoagulation  Can start oral  anticoagulants and begin discharge planning for next 1-2 days  Follow up in office with me in several weeks (patient says he already has appointment)  Increase activity    Leotis Pain  08/12/2016, 2:52 PM

## 2016-08-12 NOTE — Progress Notes (Signed)
ANTICOAGULATION CONSULT NOTE - Initial Consult  Pharmacy Consult for Apixaban Indication: pulmonary embolus  No Known Allergies  Patient Measurements: Height: 6' (182.9 cm) Weight: 200 lb (90.7 kg) IBW/kg (Calculated) : 77.6   Vital Signs: Temp: 98.2 F (36.8 C) (02/08 0722) Temp Source: Oral (02/08 0722) BP: 150/77 (02/08 0722) Pulse Rate: 65 (02/08 0722)  Labs:  Recent Labs  08/09/16 1432 08/09/16 1843 08/09/16 2054  08/10/16 0419 08/10/16 1500 08/11/16 0006 08/11/16 0007 08/12/16 0435  HGB 14.6  --   --   --  12.9*  --   --  13.5 12.7*  HCT 41.2  --   --   --  37.7*  --   --  38.6* 36.3*  PLT 239  --   --   --  207  --   --  260 284  APTT  --   --  32  --   --   --   --   --   --   LABPROT  --   --  18.4*  --   --   --   --   --   --   INR  --   --  1.51  --   --   --   --   --   --   HEPARINUNFRC  --   --   --   < > 0.22* 0.30 0.34  --  0.36  CREATININE 1.61*  --   --   --  1.29*  --   --  1.30* 1.15  TROPONINI <0.03 <0.03  --   --   --   --   --   --   --   < > = values in this interval not displayed.  Estimated Creatinine Clearance: 64.7 mL/min (by C-G formula based on SCr of 1.15 mg/dL).   Assessment: 71 yo male with Pulmonary Embolism currently on Heparin drip and s/p IVC filter to start Apixaban.  Hx Bladder Cancer and DVT.   Goal of Therapy:  Monitor platelets by anticoagulation protocol: Yes   Plan:  Will order Apixaban 10 mg PO BID x 7 days then apixaban 5 mg PO BID. SCr and CBC at least every three days per policy. Heparin drip to be stopped when first apixaban dose given - spoke to RN about plan  Pharmacy will continue to follow.   Cory Hardin 08/12/2016,12:33 PM

## 2016-08-12 NOTE — Progress Notes (Signed)
Date: 08/12/2016,   MRN# [id number] Cory Hardin Dec 28, 1945 Code Status:     Code Status Orders        Start     Ordered   08/09/16 2044  Full code  Continuous     08/09/16 2044    Code Status History    Date Active Date Inactive Code Status Order ID Comments User Context   This patient has a current code status but no historical code status.     Hosp day:@LENGTHOFSTAYDAYS @ Referring MD: @ATDPROV @      HPI: treating pulmonary embolism/right dvt. S/p intrapulmonary lytic agent and catheter thrombectomy. Improved, eliquis started  PMHX:   Past Medical History:  Diagnosis Date  . Bilateral kidney stones   . BPH (benign prostatic hyperplasia)   . Cancer ([place])    bladder  . 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 Chronic kidney disease    stones,renal failure  . Diabetes mellitus without complication ([place])   . DVT (deep venous thrombosis) ([place])   . GERD (gastroesophageal reflux disease)   . Hematuria   . Hyperlipidemia   . Hypertension    Surgical Hx:  Past Surgical History:  Procedure Laterality Date  . APPENDECTOMY    . bladder stones    . CYSTOSCOPY    . CYSTOSCOPY WITH BIOPSY N/A 11/20/2014   Procedure: CYSTOSCOPY WITH BIOPSY;  Surgeon: [name], MD;  Location: [facility];  Service: Urology;  Laterality: N/A;  . CYSTOSCOPY WITH STENT PLACEMENT Right 06/23/2015   Procedure: CYSTOSCOPY WITH STENT PLACEMENT;  Surgeon: [name], MD;  Location: [facility];  Service: Urology;  Laterality: Right;  . IVC FILTER INSERTION N/A 08/11/2016   Procedure: IVC Filter Insertion;  Surgeon: Algernon Huxley, MD;  Location: [facility];  Service: Cardiovascular;  Laterality: N/A;  . PERCUTANEOUS NEPHROLITHOTRIPSY    . TRANSURETHRAL RESECTION OF BLADDER TUMOR N/A 06/23/2015   Procedure: TRANSURETHRAL RESECTION OF BLADDER TUMOR (TURBT);  Surgeon: [name], MD;  Location: [facility];  Service: Urology;  Laterality: N/A;  . TRANSURETHRAL RESECTION OF BLADDER  TUMOR WITH GYRUS (TURBT-GYRUS)    . URETEROSCOPY WITH HOLMIUM LASER LITHOTRIPSY Right 06/23/2015   Procedure: URETEROSCOPY WITH HOLMIUM LASER LITHOTRIPSY;  Surgeon: [name], MD;  Location: [facility];  Service: Urology;  Laterality: Right;   Family Hx:  Family History  Problem Relation Age of Onset  . Heart disease Father   . Heart disease Mother   . Heart failure Mother   . COPD Mother   . Cancer Maternal Aunt   . Heart disease Brother   . Diabetes Brother   . Kidney disease Neg Hx   . Prostate cancer Neg Hx    Social Hx:   Social History  Substance Use Topics  . Smoking status: Former Smoker    Packs/day: 0.25    Years: 15.00    Types: Cigarettes    Quit date: 11/13/1982  . Smokeless tobacco: Never Used  . Alcohol use 0.6 oz/week    1 Standard drinks or equivalent per week     Comment: rare   Medication:    Home Medication:    Current Medication: @CURMEDTAB @   Allergies:  Patient has no known allergies.  Review of Systems: Gen:  Denies  fever, sweats, chills HEENT: Denies blurred vision, double vision, ear pain, eye pain, hearing loss, nose bleeds, sore throat Cvc:  No dizziness, chest pain or heaviness Resp:   Less sob, less pain.  Gi: Denies  swallowing difficulty, stomach pain, nausea or vomiting, diarrhea, constipation, bowel incontinence Gu:  Denies bladder incontinence, burning urine Ext:   No Joint pain, stiffness or swelling Skin: No skin rash, easy bruising or bleeding or hives Endoc:  No polyuria, polydipsia , polyphagia or weight change Psych: No depression, insomnia or hallucinations  Other:  All other systems negative  Physical Examination:   VS: BP 137/76 (BP Location: Right Arm)   Pulse (!) 59   Temp 98.4 F (36.9 C) (Oral)   Resp 16   Ht 6' (1.829 m)   Wt 200 lb (90.7 kg)   SpO2 94%   BMI 27.12 kg/m   General Appearance: No distress, less sob  Neuro: without focal findings, mental status, speech normal, alert and oriented, cranial  nerves 2-12 intact, reflexes normal and symmetric, sensation grossly normal  HEENT: PERRLA, EOM intact, no ptosis, no other lesions noticed, Mallampati: Pulmonary:.No wheezing, No rales     Cardiovascular:  Normal S1,S2.  No m/r/g.  Abdominal aorta pulsation normal.    Abdomen:Benign, Soft, non-tender, No masses, hepatosplenomegaly, No lymphadenopathy Endoc: No evident thyromegaly, no signs of acromegaly or Cushing features Skin:   warm, no rashes, no ecchymosis  Extremities: normal, no cyanosis, clubbing, no edema, warm with normal capillary refill. Other findings:   Labs results:   Recent Labs     08/09/16  2054  08/10/16  0419  08/11/16  0007  08/12/16  0435  HGB   --   12.9*  13.5  12.7*  HCT   --   37.7*  38.6*  36.3*  MCV   --   92.1  90.8  91.1  WBC   --   9.4  9.3  8.8  BUN   --   28*  30*  22*  CREATININE   --   1.29*  1.30*  1.15  GLUCOSE   --   150*  163*  134*  CALCIUM   --   8.6*  8.7*  8.4*  INR  1.51   --    --    --   ,    Assessment and Plan:  Right sub acute pulmonary embolism, right DVT. There was right heart strain. Filter  Placed, s/p -intrapulmonary thrombolytic and thrombectomy --start po anticoagulation -out patient f/u with pulmonary .  Suspect underlying copd which may explain some of his relative hypoxia. -wean o2 as tolerated -prn albuterol -out patient pfts -following     I have personally obtained a history, examined the patient, evaluated laboratory and imaging results, formulated the assessment and plan and placed orders.  The Patient requires high complexity decision making for assessment and support, frequent evaluation and titration of therapies, application of advanced monitoring technologies and extensive interpretation of multiple databases.   Barbar Brede,M.D. Pulmonary & Critical care Medicine [facility]

## 2016-08-12 NOTE — Progress Notes (Signed)
ANTICOAGULATION CONSULT NOTE - Initial Consult  Pharmacy Consult for Heparin Drip  Indication: pulmonary embolus  No Known Allergies  Patient Measurements: Height: 6' (182.9 cm) Weight: 200 lb (90.7 kg) IBW/kg (Calculated) : 77.6  Vital Signs: Temp: 98.7 F (37.1 C) (02/08 0407) Temp Source: Oral (02/08 0407) BP: 135/81 (02/08 0407) Pulse Rate: 62 (02/08 0407)  Labs:  Recent Labs  08/09/16 1432 08/09/16 1843 08/09/16 2054  08/10/16 0419 08/10/16 1500 08/11/16 0006 08/11/16 0007 08/12/16 0435  HGB 14.6  --   --   --  12.9*  --   --  13.5 12.7*  HCT 41.2  --   --   --  37.7*  --   --  38.6* 36.3*  PLT 239  --   --   --  207  --   --  260 284  APTT  --   --  32  --   --   --   --   --   --   LABPROT  --   --  18.4*  --   --   --   --   --   --   INR  --   --  1.51  --   --   --   --   --   --   HEPARINUNFRC  --   --   --   < > 0.22* 0.30 0.34  --  0.36  CREATININE 1.61*  --   --   --  1.29*  --   --  1.30* 1.15  TROPONINI <0.03 <0.03  --   --   --   --   --   --   --   < > = values in this interval not displayed.  Estimated Creatinine Clearance: 64.7 mL/min (by C-G formula based on SCr of 1.15 mg/dL).   Medical History: Past Medical History:  Diagnosis Date  . Bilateral kidney stones   . BPH (benign prostatic hyperplasia)   . Cancer ([place])    bladder  . 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 Chronic kidney disease    stones,renal failure  . Diabetes mellitus without complication ([place])   . DVT (deep venous thrombosis) ([place])   . GERD (gastroesophageal reflux disease)   . Hematuria   . Hyperlipidemia   . Hypertension    Assessment: 71 yo male with PMH of bladder cancer found to have PE. Pharmacy consulted for heparin dosing and monitoring.   Goal of Therapy:  Heparin level 0.3-0.7 units/ml Monitor platelets by anticoagulation protocol: Yes   Plan:  Baseline labs ordered.  Give 4000 units bolus x 1 Start heparin infusion at 1400  units/hr Check anti-Xa level in 8 hours and daily while on heparin Continue to monitor H&H and platelets   2/6 AM heparin level 0.22. 1200 unit bolus and increase rate to 1550 units/hr. Recheck in 8 hours.  2/6 1500 HL 0.30. Level is right at therapeutic. Will increase infusion to 1600 units/hr and recheck HL in 8 hours. If level remains therapeutic, will move to check HL daily with AM labs. CBC ordered with AM labs.   2/7 00:00 heparin level 0.34. Continue current regimen. Recheck heparin level and CBC with tomorrow AM labs.  2/8 AM heparin level 0.36. Continue current regimen. Recheck with tomorrow AM labs.   Eloise Harman, PharmD, BCPS Clinical Pharmacist 08/12/2016 6:03 AM

## 2016-08-12 NOTE — Progress Notes (Addendum)
[facility] NAME: Cory Hardin    MR#:  [id number]  DATE OF BIRTH:  08/10/1945  SUBJECTIVE:   Patient still has right-sided pleuritic chest pain and SOB, on O2 [place] 3 L.  REVIEW OF SYSTEMS:    Review of Systems  Constitutional: Positive for malaise/fatigue. Negative for chills and fever.  HENT: Negative.  Negative for ear discharge, ear pain, hearing loss, nosebleeds and sore throat.   Eyes: Negative.  Negative for blurred vision and pain.  Respiratory: Positive for shortness of breath. Negative for cough, hemoptysis and wheezing.   Cardiovascular: Positive for chest pain. Negative for palpitations and leg swelling.  Gastrointestinal: Negative.  Negative for abdominal pain, blood in stool, diarrhea, nausea and vomiting.  Genitourinary: Negative.  Negative for dysuria.  Musculoskeletal: Negative.  Negative for back pain.  Skin: Negative.   Neurological: Negative for dizziness, tremors, speech change, focal weakness, seizures and headaches.  Endo/Heme/Allergies: Negative.  Does not bruise/bleed easily.  Psychiatric/Behavioral: Negative.  Negative for depression, hallucinations and suicidal ideas.    Tolerating Diet: yes DRUG ALLERGIES:  No Known Allergies  VITALS:  Blood pressure (!) 150/77, pulse 65, temperature 98.2 F (36.8 C), temperature source Oral, resp. rate 16, height 6' (1.829 m), weight 200 lb (90.7 kg), SpO2 94 %.  PHYSICAL EXAMINATION:   Physical Exam  Constitutional: He is oriented to person, place, and time and well-developed, well-nourished, and in no distress.  HENT:  Head: Normocephalic.  Mouth/Throat: Oropharynx is clear and moist.  Eyes: Conjunctivae and EOM are normal.  Neck: Normal range of motion. Neck supple. No JVD present. No tracheal deviation present.  Cardiovascular: Normal rate, regular rhythm and normal heart sounds.  Exam reveals no gallop.   No murmur heard. Pulmonary/Chest: Effort normal and  breath sounds normal. No respiratory distress. He has no wheezes. He has no rales.  Abdominal: Soft. Bowel sounds are normal. He exhibits no distension. There is no tenderness.  Musculoskeletal: Normal range of motion. He exhibits no edema or tenderness.  Neurological: He is alert and oriented to person, place, and time. No cranial nerve deficit.  Skin: No rash noted. No erythema.  Psychiatric: Affect and judgment normal.      LABORATORY PANEL:   CBC  Recent Labs Lab 08/12/16 0435  WBC 8.8  HGB 12.7*  HCT 36.3*  PLT 284   ------------------------------------------------------------------------------------------------------------------  Chemistries   Recent Labs Lab 08/09/16 1432  08/11/16 0007 08/12/16 0435  NA 138  < > 137 137  K 4.4  < > 3.2* 3.9  CL 101  < > 102 107  CO2 29  < > 26 24  GLUCOSE 144*  < > 163* 134*  BUN 31*  < > 30* 22*  CREATININE 1.61*  < > 1.30* 1.15  CALCIUM 9.3  < > 8.7* 8.4*  MG  --   --  2.0  --   AST 31  --   --   --   ALT 23  --   --   --   ALKPHOS 84  --   --   --   BILITOT 1.1  --   --   --   < > = values in this interval not displayed. ------------------------------------------------------------------------------------------------------------------  Cardiac Enzymes  Recent Labs Lab 08/09/16 1432 08/09/16 1843  TROPONINI <0.03 <0.03   ------------------------------------------------------------------------------------------------------------------  RADIOLOGY:  No results found.   ASSESSMENT AND PLAN:    71 year old male with a past history of DVT  who presented with shortness of breath and found to have right lower lobe submassive pulmonary emboli with right heart strain on CT scan.  1. Acute submassive pulmonary emboli with possible pulmonary infarct/Right heart strain: Continue heparin drip, echocardiogram: Systolic function was normal. The estimated ejection fraction was in the range of 60% to 65%. S/p IVC filter  placement and mechanical thrombectomy and Catheter directed thrombolytic therapy by Dr. Lucky Cowboy.  Start Eliquis and discontinue heparin drip.  * Acute respiratory failure with hypoxia due to PE and possible underlying COPD. Continue oxygen [place], wean as tolerated. NEB.  2. Diabetes: Continue sliding scale insulin, ADA diet and Amaryl  3 chronic kidney disease stage III: Creatinine at baseline  4. Hyperlipidemia: Continue fenofibrate and pravastatin  5. Essential hypertension: Continue HCTZ and Coreg  Hypokalemia. Improved with potassium supplement, magnesium level is normal.  Management plans discussed with the patient, his sister and they are in agreement.  CODE STATUS: full  TOTAL TIME TAKING CARE OF THIS PATIENT: 38 minutes.   POSSIBLE D/C 2 days, DEPENDING ON CLINICAL CONDITION.   Demetrios Loll M.D on 08/12/2016 at 1:01 PM  Between 7am to 6pm - Pager - [phone] After 6pm go to [email or web address] - password [email or web address] [facility]  Office  [phone]  CC: Primary care physician; Albina Billet, MD  Note: This dictation was prepared with Dragon dictation along with smaller phrase technology. Any transcriptional errors that result from this process are unintentional.

## 2016-08-13 ENCOUNTER — Encounter: Payer: Self-pay | Admitting: Vascular Surgery

## 2016-08-13 LAB — CBC
HCT: 36.1 % — ABNORMAL LOW (ref 40.0–52.0)
Hemoglobin: 12.6 g/dL — ABNORMAL LOW (ref 13.0–18.0)
MCH: 31.7 pg (ref 26.0–34.0)
MCHC: 34.9 g/dL (ref 32.0–36.0)
MCV: 90.9 fL (ref 80.0–100.0)
Platelets: 287 10*3/uL (ref 150–440)
RBC: 3.98 MIL/uL — ABNORMAL LOW (ref 4.40–5.90)
RDW: 13.2 % (ref 11.5–14.5)
WBC: 9.1 10*3/uL (ref 3.8–10.6)

## 2016-08-13 LAB — GLUCOSE, CAPILLARY
Glucose-Capillary: 138 mg/dL — ABNORMAL HIGH (ref 65–99)
Glucose-Capillary: 173 mg/dL — ABNORMAL HIGH (ref 65–99)

## 2016-08-13 MED ORDER — GUAIFENESIN-DM 100-10 MG/5ML PO SYRP
10.0000 mL | ORAL_SOLUTION | ORAL | Status: DC | PRN
  Administered 2016-08-13: 10 mL via ORAL
  Filled 2016-08-13: qty 10

## 2016-08-13 MED ORDER — APIXABAN 5 MG PO TABS: ORAL_TABLET | ORAL | 2 refills | Status: DC

## 2016-08-13 MED ORDER — GUAIFENESIN-DM 100-10 MG/5ML PO SYRP: 10.0000 mL | ORAL_SOLUTION | ORAL | 0 refills | Status: DC | PRN

## 2016-08-13 NOTE — Discharge Summary (Signed)
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 Acute respiratory failure with hypoxia due to PE and possible underlying COPD. SECONDARY DIAGNOSIS:   Past Medical History:  Diagnosis Date  . Bilateral kidney stones   . BPH (benign prostatic hyperplasia)   . Cancer ([place])    bladder  . 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 Chronic kidney disease    stones,renal failure  . Diabetes mellitus without complication ([place])   . DVT (deep venous thrombosis) ([place])   . GERD (gastroesophageal reflux disease)   . Hematuria   . Hyperlipidemia   . Hypertension    HOSPITAL COURSE:  71 year old male with a past history of DVT who presented with shortness of breath and found to have right lower lobe submassive pulmonary emboli with right heart strain on CT scan.  1. 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 He was on heparin drip, echocardiogram: Systolic function was normal. The estimated ejection fraction was in the range of 60% to 65%. S/p IVC filter placement and mechanical thrombectomy and Catheter directed thrombolytic therapy by Dr. Lucky Cowboy.  Started Eliquis and discontinued heparin drip per Dr. Gust Brooms recommendation.  * Acute respiratory failure with hypoxia due to PE and possible underlying COPD. off oxygen [place]. NEB.  2. Diabetes:  Continue sliding scale insulin, ADA diet and Amaryl  3 chronic kidney disease stage III: Creatinine at baseline  4. Hyperlipidemia: Continue fenofibrate and pravastatin  5. Essential hypertension: Continue HCTZ and Coreg  Hypokalemia. Improved with potassium supplement, magnesium level is normal.  DISCHARGE CONDITIONS:  Medically stable, he was discharged to home today. CONSULTS OBTAINED:   DRUG ALLERGIES:  No Known Allergies DISCHARGE MEDICATIONS:   Allergies as of 08/13/2016   No Known Allergies     Medication List    STOP taking these medications   clopidogrel 75 MG tablet Commonly known as:  PLAVIX     TAKE these medications   ACCU-CHEK AVIVA PLUS test strip Generic drug:  glucose blood   ACCU-CHEK SOFTCLIX LANCETS lancets   apixaban 5 MG Tabs tablet Commonly known as:  ELIQUIS 10 mg po bid for 6 days, then 5 mg po bid.   carvedilol 12.5 MG tablet Commonly known as:  COREG Take 12.5 mg by mouth 2 (two) times daily with a meal.   fenofibrate 160 MG tablet Take 160 mg by mouth daily.   glimepiride 1 MG tablet Commonly known as:  AMARYL Take 1 mg by mouth daily.   guaiFENesin-dextromethorphan 100-10 MG/5ML syrup Commonly known as:  ROBITUSSIN DM Take 10 mLs by mouth every 4 (four) hours as needed for cough.   hydrochlorothiazide 25 MG tablet Commonly known as:  HYDRODIURIL TAKE ONE TABLET BY MOUTH ONCE DAILY   omeprazole 20 MG capsule Commonly known as:  PRILOSEC Take 20 mg by mouth daily.   pravastatin 40 MG tablet Commonly known as:  PRAVACHOL Take 40 mg  by mouth daily. pm        DISCHARGE INSTRUCTIONS:  See AVS.  If you experience worsening of your admission symptoms, develop shortness of breath, life threatening emergency, suicidal or homicidal thoughts you must seek medical attention immediately by calling 911 or calling your MD immediately  if symptoms less severe.  You Must read complete instructions/literature along with all the  possible adverse reactions/side effects for all the Medicines you take and that have been prescribed to you. Take any new Medicines after you have completely understood and accpet all the possible adverse reactions/side effects.   Please note  You were cared for by a hospitalist during your hospital stay. If you have any questions about your discharge medications or the care you received while you were in the hospital after you are discharged, you can call the unit and asked to speak with the hospitalist on call if the hospitalist that took care of you is not available. Once you are discharged, your primary care physician will handle any further medical issues. Please note that NO REFILLS for any discharge medications will be authorized once you are discharged, as it is imperative that you return to your primary care physician (or establish a relationship with a primary care physician if you do not have one) for your aftercare needs so that they can reassess your need for medications and monitor your lab values.    On the day of Discharge:  VITAL SIGNS:  Blood pressure 131/69, pulse 67, temperature 97.5 F (36.4 C), temperature source Oral, resp. rate 17, height 6' (1.829 m), weight 200 lb (90.7 kg), SpO2 94 %. PHYSICAL EXAMINATION:  GENERAL:  71 y.o.-year-old patient lying in the bed with no acute distress.  EYES: Pupils equal, round, reactive to light and accommodation. No scleral icterus. Extraocular muscles intact.  HEENT: Head atraumatic, normocephalic. Oropharynx and nasopharynx clear.  NECK:  Supple, no jugular venous distention. No thyroid enlargement, no tenderness.  LUNGS: Normal breath sounds bilaterally, no wheezing, rales,rhonchi or crepitation. No use of accessory muscles of respiration.  CARDIOVASCULAR: S1, S2 normal. No murmurs, rubs, or gallops.  ABDOMEN: Soft, non-tender, non-distended. Bowel sounds present. No organomegaly or mass.  EXTREMITIES: No pedal edema, cyanosis, or  clubbing.  NEUROLOGIC: Cranial nerves II through XII are intact. Muscle strength 5/5 in all extremities. Sensation intact. Gait not checked.  PSYCHIATRIC: The patient is alert and oriented x 3.  SKIN: No obvious rash, lesion, or ulcer.  DATA REVIEW:   CBC  Recent Labs Lab 08/13/16 0428  WBC 9.1  HGB 12.6*  HCT 36.1*  PLT 287    Chemistries   Recent Labs Lab 08/09/16 1432  08/11/16 0007 08/12/16 0435  NA 138  < > 137 137  K 4.4  < > 3.2* 3.9  CL 101  < > 102 107  CO2 29  < > 26 24  GLUCOSE 144*  < > 163* 134*  BUN 31*  < > 30* 22*  CREATININE 1.61*  < > 1.30* 1.15  CALCIUM 9.3  < > 8.7* 8.4*  MG  --   --  2.0  --   AST 31  --   --   --   ALT 23  --   --   --   ALKPHOS 84  --   --   --   BILITOT 1.1  --   --   --   < > = values in this interval not displayed.   Microbiology Results  Results for orders placed or performed in visit on 07/28/16  Microscopic Examination     Status: Abnormal   Collection Time: 07/28/16  8:33 AM  Result Value Ref Range Status   WBC, UA 0-5 0 - 5 /hpf Final   RBC, UA >30 (A) 0 - 2 /hpf Final   Epithelial Cells (non renal) 0-10 0 - 10 /hpf Final   Mucus, UA Present (A) Not Estab. Final   Bacteria, UA Few None seen/Few Final    RADIOLOGY:  No results found.   Management plans discussed with the patient, His sister and they are in agreement.  CODE STATUS:  Code Status History    Date Active Date Inactive Code Status Order ID Comments User Context   08/09/2016  8:44 PM 08/13/2016  4:47 PM Full Code [id number]  Loletha Grayer, MD ED      TOTAL TIME TAKING CARE OF THIS PATIENT: 33 minutes.    Cory Hardin M.D on 08/13/2016 at 5:13 PM  Between 7am to 6pm - Pager - [phone]  After 6pm go to [email or web address] - Proofreader  [facility]  Office  [phone]  CC: Primary care physician; Albina Billet, MD   Note: This dictation was prepared with Dragon dictation along with smaller phrase technology.  Any transcriptional errors that result from this process are unintentional.

## 2016-08-13 NOTE — Progress Notes (Signed)
Pt on room air saturating at 94%. I ambulated in hallway with patient approximately 100 ft and back to room. His oxygen saturation never dropped below 93% on room air while ambulating. He had no complaints of significant DOE or SOB while ambulating. Will notify MD

## 2016-08-13 NOTE — Progress Notes (Signed)
ANTICOAGULATION CONSULT NOTE - Follow up [place] for Apixaban Indication: pulmonary embolus  No Known Allergies  Patient Measurements: Height: 6' (182.9 cm) Weight: 200 lb (90.7 kg) IBW/kg (Calculated) : 77.6   Vital Signs: Temp: 98.5 F (36.9 C) (02/09 0752) Temp Source: Oral (02/09 0752) BP: 130/74 (02/09 0752) Pulse Rate: 65 (02/09 0752)  Labs:  Recent Labs  08/10/16 1500 08/11/16 0006  08/11/16 0007 08/12/16 0435 08/13/16 0428  HGB  --   --   < > 13.5 12.7* 12.6*  HCT  --   --   --  38.6* 36.3* 36.1*  PLT  --   --   --  260 284 287  HEPARINUNFRC 0.30 0.34  --   --  0.36  --   CREATININE  --   --   --  1.30* 1.15  --   < > = values in this interval not displayed.  Estimated Creatinine Clearance: 64.7 mL/min (by C-G formula based on SCr of 1.15 mg/dL).   Assessment: 71 yo male with Pulmonary Embolism currently on Heparin drip and s/p IVC filter to start Apixaban.  Hx Bladder Cancer and DVT.   Goal of Therapy:  Monitor platelets by anticoagulation protocol: Yes   Plan:  Will continue Apixaban 10 mg PO BID x 7 days then apixaban 5 mg PO BID. SCr and CBC at least every three days per policy. Hgb and plt count today stable from yesterday.  Pt counseled on apixaban on 08/13/2016.  Pharmacy will continue to follow.   Rocky Morel 08/13/2016,9:53 AM

## 2016-08-13 NOTE — Care Management Note (Signed)
Case Management Note  Patient Details  Name: Cory Hardin MRN: [id number] Date of Birth: 1945-12-26  Subjective/Objective:   Eliquis coupon given                 Action/Plan:   Expected Discharge Date:  08/11/16               Expected Discharge Plan:  Home/Gear Care  In-House Referral:     Discharge planning Services     Post Acute Care Choice:    Choice offered to:     DME Arranged:    DME Agency:     HH Arranged:    [place] Agency:     Status of Service:  Completed, signed off  If discussed at [employer] of Stay Meetings, dates discussed:    Additional Comments:  Jolly Mango, RN 08/13/2016, 9:38 AM

## 2016-08-13 NOTE — Discharge Instructions (Signed)
Heart healthy and ADA diet. Watch for bleeding.

## 2016-08-13 NOTE — Evaluation (Signed)
Physical Therapy Evaluation Patient Details Name: ENGLISH COIT MRN: [id number] DOB: 03-25-1946 Today's Date: 08/13/2016   History of Present Illness  71 y/o male here with PE, he had thrombectomy/thrombolysis and IVC placed.  Clinical Impression  Pt showed good strength, balance and confidence with ambulation but was fatigued t/o the effort and even on 2-3 liters O2 during activity his sats dropped into the mid 80s relatively quickly.  He had no safety concerns regarding PT specific tasks, but had weak/painful cough t/o the effort and fatigued with ~75 ft of ambulation (including steps).  Overall pt will likely not require HHPT once cardio-pulm issues are stable.     Follow Up Recommendations No PT follow up (will maintain PT services in the hospital per cardio-pulm)    Equipment Recommendations  None recommended by PT (may need O2)    Recommendations for Other Services       Precautions / Restrictions Restrictions Weight Bearing Restrictions: No      Mobility  Bed Mobility Overal bed mobility: Independent             General bed mobility comments: Pt able to easily rise to sitting EOB w/o assist  Transfers Overall transfer level: Independent Equipment used: None             General transfer comment: Pt able to rise w/o issue, initially cautious, but no safety issues  Ambulation/Gait Ambulation/Gait assistance: Supervision Ambulation Distance (Feet): 75 Feet Assistive device: None       General Gait Details: Pt was able to ambulate w/ good safety but did fatigue with the effort on 2-3 liters.  His O2 sats dropped from low 90s to high 80s and he did report some fatigue.  Pt with no LOBs and good confidence/cadence.   Stairs Stairs: Yes Stairs assistance: Supervision Stair Management: One rail Left Number of Stairs: 4 General stair comments: Pt able to negotiate up/down steps w/o issue  Wheelchair Mobility    Modified Rankin (Stroke Patients Only)        Balance Overall balance assessment: Modified Independent                                           Pertinent Vitals/Pain Pain Assessment:  (no pain at rest, chest pain with coughing)    Home Living Family/patient expects to be discharged to:: Private residence Living Arrangements: Alone Available Help at Discharge: Family;Friend(s)   Home Access: Stairs to enter Entrance Stairs-Rails: Left Entrance Stairs-Number of Steps: 3   Home Equipment: None      Prior Function Level of Independence: Independent         Comments: Pt reports that he is normally able to be fairly active, runs errands, etc     Hand Dominance        Extremity/Trunk Assessment   Upper Extremity Assessment Upper Extremity Assessment: Overall WFL for tasks assessed    Lower Extremity Assessment Lower Extremity Assessment: Overall WFL for tasks assessed       Communication   Communication: No difficulties (some consistent weak coughing )  Cognition Arousal/Alertness: Awake/alert Behavior During Therapy: WFL for tasks assessed/performed Overall Cognitive Status: Within Functional Limits for tasks assessed                      General Comments      Exercises     Assessment/Plan  PT Assessment Patient needs continued PT services  PT Problem List Decreased activity tolerance;Cardiopulmonary status limiting activity          PT Treatment Interventions Gait training;Functional mobility training;Therapeutic activities;Therapeutic exercise;Balance training    PT Goals (Current goals can be found in the Care Plan section)  Acute Rehab PT Goals Patient Stated Goal: go home PT Goal Formulation: With patient Time For Goal Achievement: 08/27/16 Potential to Achieve Goals: Good    Frequency Min 2X/week   Barriers to discharge        Co-evaluation               End of Session Equipment Utilized During Treatment: Gait belt;Oxygen (2-3 liters t/o  session) Activity Tolerance: Patient tolerated treatment well;Patient limited by fatigue Patient left: in chair;with call bell/phone within reach           Time: 0850-0906 PT Time Calculation (min) (ACUTE ONLY): 16 min   Charges:   PT Evaluation $PT Eval Low Complexity: 1 Procedure     PT G CodesKreg Shropshire, DPT 08/13/2016, 11:21 AM

## 2016-08-13 NOTE — Progress Notes (Signed)
Patient being discharged home. PIV removed. Discharge instructions reviewed with pt, all questions answered. He will scheduled his follow up appointment with Pulmonology and PCP, phone numbers and addresses were provided in instructions. He was given a copy of his prescriptions to have filled at pharmacy as well as a coupon for his Eliquis. He has no needed DME or HH needs at discharge. He is leaving with all his belongings, will be transported via family member.

## 2016-08-16 ENCOUNTER — Telehealth: Payer: Self-pay | Admitting: Urology

## 2016-08-16 NOTE — Telephone Encounter (Signed)
Called patient to discuss his urine cytology results. Unfortunately, since his last visit, he had a massive PE requiring IVC filter placement and thrombectomy. On the phone today, he short of breath and is struggling with hypoxia.  I have recommended repeat urine cytology, voided in approximately one month if doing better from a pulmonary standpoint. I will have our office call him around that time to remind him.    He is scheduled again for cystoscopy in July and is hesitant to come in earlier given his current medical issues.  We will plan to keep this follow-up as scheduled and repeat the urine cytology sooner.  [name], MD

## 2016-08-31 DIAGNOSIS — I2699 Other pulmonary embolism without acute cor pulmonale: Secondary | ICD-10-CM | POA: Insufficient documentation

## 2016-08-31 HISTORY — DX: Other pulmonary embolism without acute cor pulmonale: I26.99

## 2016-09-09 NOTE — Telephone Encounter (Signed)
Spoke with patient he is coming to the office tomorrow to do repeat cytology, apt was made on the lab @8 :30

## 2016-09-10 ENCOUNTER — Other Ambulatory Visit: Payer: Medicare Other

## 2016-09-10 NOTE — Progress Notes (Signed)
Urine cytology sent today per Dr. Erlene Quan.

## 2016-09-16 ENCOUNTER — Other Ambulatory Visit: Payer: Self-pay | Admitting: Urology

## 2016-09-16 ENCOUNTER — Telehealth: Payer: Self-pay

## 2016-09-16 NOTE — Telephone Encounter (Signed)
Spoke with pt in reference to cytology results and cystos. Pt voiced understanding.

## 2016-09-16 NOTE — Telephone Encounter (Signed)
-----   Message from [name], MD sent at 09/16/2016  9:38 AM EDT ----- Please let [name] know his repeat cytology was normal.  This is great news.  We will continue to follow his cystoscopically.    [name], MD

## 2016-12-31 ENCOUNTER — Ambulatory Visit (INDEPENDENT_AMBULATORY_CARE_PROVIDER_SITE_OTHER): Payer: Medicare Other | Admitting: Vascular Surgery

## 2016-12-31 ENCOUNTER — Encounter (INDEPENDENT_AMBULATORY_CARE_PROVIDER_SITE_OTHER): Payer: Self-pay

## 2016-12-31 VITALS — BP 185/96 | HR 64 | Resp 16 | Ht 72.0 in | Wt 203.0 lb

## 2016-12-31 DIAGNOSIS — I2609 Other pulmonary embolism with acute cor pulmonale: Secondary | ICD-10-CM | POA: Diagnosis not present

## 2016-12-31 DIAGNOSIS — I2782 Chronic pulmonary embolism: Secondary | ICD-10-CM

## 2016-12-31 DIAGNOSIS — E785 Hyperlipidemia, unspecified: Secondary | ICD-10-CM

## 2016-12-31 DIAGNOSIS — I87009 Postthrombotic syndrome without complications of unspecified extremity: Secondary | ICD-10-CM

## 2016-12-31 DIAGNOSIS — Z95828 Presence of other vascular implants and grafts: Secondary | ICD-10-CM

## 2016-12-31 DIAGNOSIS — C679 Malignant neoplasm of bladder, unspecified: Secondary | ICD-10-CM

## 2016-12-31 HISTORY — DX: Presence of other vascular implants and grafts: Z95.828

## 2016-12-31 NOTE — Assessment & Plan Note (Signed)
Tolerating anticoagulation. No further need for his IVC filter. Plan to remove this next month.

## 2016-12-31 NOTE — Assessment & Plan Note (Signed)
He has been on full anticoagulation for over 3 months and now with a second unprovoked thrombotic incident, would likely need lifelong anticoagulation. At a minimum, he will need one year of anticoagulation.

## 2016-12-31 NOTE — Assessment & Plan Note (Signed)
Swelling currently pretty mild. Compression stockings and elevation as needed.

## 2016-12-31 NOTE — Progress Notes (Signed)
MRN : [id number]  Cory Hardin is a 71 y.o. (Oct 06, 1945) male who presents with chief complaint of  Chief Complaint  Patient presents with  . Follow-up  .  History of Present Illness: Patient returns today in follow up of DVT and a separate episode of pulmonary embolus several months later after being off of anticoagulation. He actually had thrombectomy and thrombolytic therapy for his pulmonary embolus as well as the placement of an IVC filter. He still notices occasional pain in his right long and some shortness of breath with activity, but this is markedly improved and he is generally doing better. He has been on full anticoagulation for over 3 months and now with a second unprovoked thrombotic incident, would likely need lifelong anticoagulation. At a minimum, he will need one year of anticoagulation.  Current Outpatient Prescriptions  Medication Sig Dispense Refill  . ACCU-CHEK AVIVA PLUS test strip     . ACCU-CHEK SOFTCLIX LANCETS lancets     . apixaban (ELIQUIS) 5 MG TABS tablet 10 mg po bid for 6 days, then 5 mg po bid. 60 tablet 2  . carvedilol (COREG) 12.5 MG tablet Take 12.5 mg by mouth 2 (two) times daily with a meal.    . fenofibrate 160 MG tablet Take 160 mg by mouth daily.     Marland Kitchen glimepiride (AMARYL) 1 MG tablet Take 1 mg by mouth daily.     . hydrochlorothiazide (HYDRODIURIL) 25 MG tablet TAKE ONE TABLET BY MOUTH ONCE DAILY 90 tablet 3  . omeprazole (PRILOSEC) 20 MG capsule Take 20 mg by mouth daily.     . pravastatin (PRAVACHOL) 40 MG tablet Take 40 mg by mouth daily. pm    . guaiFENesin-dextromethorphan (ROBITUSSIN DM) 100-10 MG/5ML syrup Take 10 mLs by mouth every 4 (four) hours as needed for cough. (Patient not taking: Reported on 12/31/2016) 118 mL 0   No current facility-administered medications for this visit.     Past Medical History:  Diagnosis Date  . Bilateral kidney stones   . BPH (benign prostatic hyperplasia)   . Cancer ([place])    bladder  . 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 Chronic kidney disease    stones,renal failure  . Diabetes mellitus without complication ([place])   . DVT (deep venous thrombosis) ([place])   . GERD (gastroesophageal reflux disease)   . Hematuria   . Hyperlipidemia   . Hypertension     Past Surgical History:  Procedure Laterality Date  . APPENDECTOMY    . bladder stones    . CYSTOSCOPY    . CYSTOSCOPY WITH BIOPSY N/A 11/20/2014   Procedure: CYSTOSCOPY WITH BIOPSY;  Surgeon: [name], MD;  Location: [facility];  Service: Urology;  Laterality: N/A;  . CYSTOSCOPY WITH STENT PLACEMENT Right 06/23/2015   Procedure: CYSTOSCOPY WITH STENT PLACEMENT;  Surgeon: [name], MD;  Location: [facility];  Service: Urology;  Laterality: Right;  . IVC FILTER INSERTION N/A 08/11/2016   Procedure: IVC Filter Insertion;  Surgeon: Algernon Huxley, MD;  Location: [facility];  Service: Cardiovascular;  Laterality: N/A;  . PERCUTANEOUS NEPHROLITHOTRIPSY    . PULMONARY THROMBECTOMY N/A 08/11/2016   Procedure: Pulmonary Thrombectomy;  Surgeon: Algernon Huxley, MD;  Location: [facility];  Service: Cardiovascular;  Laterality: N/A;  . TRANSURETHRAL RESECTION OF BLADDER TUMOR N/A 06/23/2015   Procedure: TRANSURETHRAL RESECTION OF BLADDER TUMOR (TURBT);  Surgeon: [name], MD;  Location: [facility];  Service: Urology;  Laterality: N/A;  . TRANSURETHRAL RESECTION OF BLADDER TUMOR WITH GYRUS (TURBT-GYRUS)    . URETEROSCOPY WITH HOLMIUM LASER LITHOTRIPSY Right 06/23/2015   Procedure: URETEROSCOPY WITH HOLMIUM LASER LITHOTRIPSY;  Surgeon: [name], MD;  Location: [facility];  Service: Urology;  Laterality: Right;    Social History        Social History  Substance Use Topics  . Smoking status: Former Smoker    Packs/day: 0.25    Years: 15.00    Types: Cigarettes    Quit date: 11/13/1982  . Smokeless tobacco: Never Used  . Alcohol use 0.6 oz/week     1 Standard drinks or equivalent  per week      Comment: rare     Family History      Family History  Problem Relation Age of Onset  . Heart disease Father   . Heart disease Mother   . Cancer Maternal Aunt   . Heart disease Brother   . Kidney disease Neg Hx   . Prostate cancer Neg Hx      No Known Allergies   REVIEW OF SYSTEMS (Negative unless checked)  Constitutional: [] Weight loss  [] Fever  [] Chills Cardiac: [] Chest pain   [] Chest pressure   [] Palpitations   [] Shortness of breath when laying flat   [] Shortness of breath at rest   [] Shortness of breath with exertion. Vascular:  [] Pain in legs with walking   [] Pain in legs at rest   [] Pain in legs when laying flat   [] Claudication   [] Pain in feet when walking  [] Pain in feet at rest  [] Pain in feet when laying flat   [x] History of DVT   [x] Phlebitis   [x] Swelling in legs   [] Varicose veins   [] Non-healing ulcers Pulmonary:   [] Uses home oxygen   [] Productive cough   [] Hemoptysis   [] Wheeze  [] COPD   [] Asthma Neurologic:  [] Dizziness  [] Blackouts   [] Seizures   [] History of stroke   [] History of TIA  [] Aphasia   [] Temporary blindness   [] Dysphagia   [] Weakness or numbness in arms   [] Weakness or numbness in legs Musculoskeletal:  [] Arthritis   [] Joint swelling   [] Joint pain   [] Low back pain Hematologic:  [] Easy bruising  [] Easy bleeding   [] Hypercoagulable state   [] Anemic   Gastrointestinal:  [] Blood in stool   [] Vomiting blood  [] Gastroesophageal reflux/heartburn   [] Abdominal pain Genitourinary:  [] Chronic kidney disease   [] Difficult urination  [] Frequent urination  [] Burning with urination   [] Hematuria  X Bladder tumor Skin:  [] Rashes   [] Ulcers   [] Wounds Psychological:  [] History of anxiety   []  History of major depression.    Physical Examination  BP (!) 185/96   Pulse 64   Resp 16   Ht 6' (1.829 m)   Wt 92.1 kg (203 lb)   BMI 27.53 kg/m  Gen:  WD/WN, NAD Head: [place]/AT, No temporalis wasting. Ear/Nose/Throat: Hearing grossly  intact, nares w/o erythema or drainage, trachea midline Eyes: Conjunctiva clear. Sclera non-icteric Neck: Supple.  No JVD.  Pulmonary:  Good air movement, no use of accessory muscles.  Cardiac: RRR, normal S1, S2 Vascular:  Vessel Right Left  Radial Palpable Palpable  Ulnar Palpable Palpable  Brachial Palpable Palpable  Carotid Palpable, without bruit Palpable, without bruit  Aorta Not palpable N/A  Femoral Palpable Palpable  Popliteal Palpable Palpable  PT Palpable Palpable  DP Palpable Palpable   Gastrointestinal: soft, non-tender/non-distended. No guarding/reflex.  Musculoskeletal: M/S 5/5 throughout.  No deformity or  atrophy. Trace lower extremity edema. Neurologic: Sensation grossly intact in extremities.  Symmetrical.  Speech is fluent.  Psychiatric: Judgment intact, Mood & affect appropriate for pt's clinical situation. Dermatologic: No rashes or ulcers noted.  No cellulitis or open wounds.       Labs No results found for this or any previous visit (from the past 2160 hour(s)).  Radiology No results found.    Assessment/Plan Bladder cancer Followed by Urology  Hyperlipidemia lipid control important in reducing the progression of atherosclerotic disease. Continue statin therapy  Pulmonary embolism ([place]) He has been on full anticoagulation for over 3 months and now with a second unprovoked thrombotic incident, would likely need lifelong anticoagulation. At a minimum, he will need one year of anticoagulation.  Uncomplicated postphlebitic syndrome Swelling currently pretty mild. Compression stockings and elevation as needed.  S/P IVC filter Tolerating anticoagulation. No further need for his IVC filter. Plan to remove this next month.    Leotis Pain, MD  12/31/2016 10:17 AM    This note was created with Dragon medical transcription system.  Any errors from dictation are purely unintentional

## 2017-01-07 ENCOUNTER — Other Ambulatory Visit (INDEPENDENT_AMBULATORY_CARE_PROVIDER_SITE_OTHER): Payer: Self-pay | Admitting: Vascular Surgery

## 2017-01-09 MED ORDER — CEFAZOLIN SODIUM-DEXTROSE 2-4 GM/100ML-% IV SOLN: 2.0000 g | Freq: Once | INTRAVENOUS | Status: DC

## 2017-01-10 ENCOUNTER — Encounter: Payer: Self-pay | Admitting: Vascular Surgery

## 2017-01-10 ENCOUNTER — Ambulatory Visit
Admission: RE | Admit: 2017-01-10 | Discharge: 2017-01-10 | Disposition: A | Discharge status: Home / Self Care | Payer: Medicare Other | Source: Ambulatory Visit | Attending: Vascular Surgery | Admitting: Vascular Surgery

## 2017-01-10 ENCOUNTER — Encounter
Admission: RE | Disposition: A | Discharge status: Home / Self Care | Payer: Self-pay | Source: Ambulatory Visit | Attending: Vascular Surgery

## 2017-01-10 DIAGNOSIS — Z7902 Long term (current) use of antithrombotics/antiplatelets: Secondary | ICD-10-CM | POA: Insufficient documentation

## 2017-01-10 DIAGNOSIS — I2609 Other pulmonary embolism with acute cor pulmonale: Secondary | ICD-10-CM | POA: Insufficient documentation

## 2017-01-10 DIAGNOSIS — K219 Gastro-esophageal reflux disease without esophagitis: Secondary | ICD-10-CM | POA: Diagnosis not present

## 2017-01-10 DIAGNOSIS — I129 Hypertensive chronic kidney disease with stage 1 through stage 4 chronic kidney disease, or unspecified chronic kidney disease: Secondary | ICD-10-CM | POA: Diagnosis not present

## 2017-01-10 DIAGNOSIS — E785 Hyperlipidemia, unspecified: Secondary | ICD-10-CM | POA: Insufficient documentation

## 2017-01-10 DIAGNOSIS — C679 Malignant neoplasm of bladder, unspecified: Secondary | ICD-10-CM | POA: Insufficient documentation

## 2017-01-10 DIAGNOSIS — N189 Chronic kidney disease, unspecified: Secondary | ICD-10-CM | POA: Diagnosis not present

## 2017-01-10 DIAGNOSIS — Z8249 Family history of ischemic heart disease and other diseases of the circulatory system: Secondary | ICD-10-CM | POA: Diagnosis not present

## 2017-01-10 DIAGNOSIS — I87009 Postthrombotic syndrome without complications of unspecified extremity: Secondary | ICD-10-CM | POA: Insufficient documentation

## 2017-01-10 DIAGNOSIS — Z9889 Other specified postprocedural states: Secondary | ICD-10-CM | POA: Diagnosis not present

## 2017-01-10 DIAGNOSIS — I8290 Acute embolism and thrombosis of unspecified vein: Secondary | ICD-10-CM

## 2017-01-10 DIAGNOSIS — Z809 Family history of malignant neoplasm, unspecified: Secondary | ICD-10-CM | POA: Insufficient documentation

## 2017-01-10 DIAGNOSIS — Z7984 Long term (current) use of oral hypoglycemic drugs: Secondary | ICD-10-CM | POA: Diagnosis not present

## 2017-01-10 DIAGNOSIS — I2782 Chronic pulmonary embolism: Secondary | ICD-10-CM | POA: Diagnosis not present

## 2017-01-10 DIAGNOSIS — N400 Enlarged prostate without lower urinary tract symptoms: Secondary | ICD-10-CM | POA: Diagnosis not present

## 2017-01-10 DIAGNOSIS — Z87891 Personal history of nicotine dependence: Secondary | ICD-10-CM | POA: Insufficient documentation

## 2017-01-10 DIAGNOSIS — E1122 Type 2 diabetes mellitus with diabetic chronic kidney disease: Secondary | ICD-10-CM | POA: Diagnosis not present

## 2017-01-10 HISTORY — PX: IVC FILTER REMOVAL: CATH118246

## 2017-01-10 LAB — GLUCOSE, CAPILLARY: Glucose-Capillary: 135 mg/dL — ABNORMAL HIGH (ref 65–99)

## 2017-01-10 SURGERY — IVC FILTER REMOVAL
Anesthesia: Moderate Sedation

## 2017-01-10 MED ORDER — FENTANYL CITRATE (PF) 100 MCG/2ML IJ SOLN
INTRAMUSCULAR | Status: AC
  Filled 2017-01-10: qty 2

## 2017-01-10 MED ORDER — ONDANSETRON HCL 4 MG/2ML IJ SOLN: 4.0000 mg | Freq: Four times a day (QID) | INTRAMUSCULAR | Status: DC | PRN

## 2017-01-10 MED ORDER — FENTANYL CITRATE (PF) 100 MCG/2ML IJ SOLN
INTRAMUSCULAR | Status: DC | PRN
  Administered 2017-01-10 (×2): 50 ug via INTRAVENOUS

## 2017-01-10 MED ORDER — CEFAZOLIN SODIUM-DEXTROSE 2-4 GM/100ML-% IV SOLN
INTRAVENOUS | Status: AC
  Filled 2017-01-10: qty 100

## 2017-01-10 MED ORDER — IOPAMIDOL (ISOVUE-300) INJECTION 61%
INTRAVENOUS | Status: DC | PRN
  Administered 2017-01-10: 15 mL via INTRAVENOUS

## 2017-01-10 MED ORDER — HEPARIN (PORCINE) IN NACL 2-0.9 UNIT/ML-% IJ SOLN
INTRAMUSCULAR | Status: AC
  Filled 2017-01-10: qty 500

## 2017-01-10 MED ORDER — SODIUM CHLORIDE 0.9 % IV SOLN
INTRAVENOUS | Status: DC
  Administered 2017-01-10: 08:00:00 via INTRAVENOUS

## 2017-01-10 MED ORDER — HYDROMORPHONE HCL 1 MG/ML IJ SOLN: 1.0000 mg | Freq: Once | INTRAMUSCULAR | Status: DC | PRN

## 2017-01-10 MED ORDER — LIDOCAINE-EPINEPHRINE (PF) 2 %-1:200000 IJ SOLN
INTRAMUSCULAR | Status: AC
  Filled 2017-01-10: qty 20

## 2017-01-10 MED ORDER — MIDAZOLAM HCL 2 MG/2ML IJ SOLN
INTRAMUSCULAR | Status: AC
  Filled 2017-01-10: qty 2

## 2017-01-10 MED ORDER — MIDAZOLAM HCL 2 MG/2ML IJ SOLN
INTRAMUSCULAR | Status: DC | PRN
  Administered 2017-01-10: 2 mg via INTRAVENOUS

## 2017-01-10 SURGICAL SUPPLY — 4 items
CANNULA 5F STIFF (CANNULA) ×3 IMPLANT
PACK ANGIOGRAPHY (CUSTOM PROCEDURE TRAY) ×3 IMPLANT
SET VENACAVA FILTER RETRIEVAL (MISCELLANEOUS) ×3 IMPLANT
WIRE J 3MM .035X145CM (WIRE) ×3 IMPLANT

## 2017-01-10 NOTE — H&P (Signed)
[facility] History & Physical Update  The patient was interviewed and re-examined.  The patient's previous History and Physical has been reviewed and is unchanged.  There is no change in the plan of care. We plan to proceed with the scheduled procedure.  Leotis Pain, MD  01/10/2017, 8:10 AM

## 2017-01-10 NOTE — Op Note (Signed)
[facility]   OPERATIVE NOTE    PRE-OPERATIVE DIAGNOSIS:  1. DVT 2. status post IVC filter placement  POST-OPERATIVE DIAGNOSIS: Same as above  PROCEDURE: 1. Ultrasound guidance for vascular access right jugular vein 2. Catheter placement into inferior vena cava from right jugular vein 3. Inferior venacavogram 4. Retrieval of Cook Celect IVC filter  SURGEON: Leotis Pain, MD  ASSISTANT(S): None  ANESTHESIA: Local with moderate conscious sedation for approximately 15 minutes using 2 mg of Versed and 100 mcg of Fentanyl  ESTIMATED BLOOD LOSS: 5 cc  CONTRAST:  15 cc  FLUORO TIME:  0.7 minutes  FINDING(S): 1. patent IVC  SPECIMEN(S): IVC filter  INDICATIONS:  Patient is a 71 y.o. male who presents with a previous history of IVC filter placement. Patient has tolerated anticoagulation and no longer needs this filter. The patient remains on anticoagulation. Risks and benefits were discussed, and informed consent was obtained.  DESCRIPTION: After obtaining full informed written consent, the patient was brought back to the vascular suite and placed supine upon the table.Moderate conscious sedation was administered during a face to face encounter with the patient throughout the procedure with my supervision of the RN administering medicines and monitoring the patient's vital signs, pulse oximetry, telemetry and mental status throughout from the start of the procedure until the patient was taken to the recovery room.  After obtaining adequate anesthesia, the patient was prepped and draped in the standard fashion. The right jugular vein was visualized with ultrasound and found to be widely patent. It was then accessed under direct ultrasound guidance without difficulty with the Seldinger needle and a permanent image was recorded. A J-wire was placed. After skin nick and dilatation, the retrieval sheath was placed over the wire and advanced into the  inferior vena cava. Inferior vena cava was imaged and found to be widely patent on inferior venacavogram. The filter was straight in its orientation. The retrieval snare was then placed through the sheath and the hook of the filter was snared without difficulty. The sheath was then advanced, and the filter was collapsed and brought into the sheath in its entirety. It was then removed from the body in its entirety. The retrieval sheath was then removed. Pressure was held at the access site and sterile dressing was placed. The patient was taken to the recovery room in stable condition having tolerated the procedure well.  COMPLICATIONS: None  CONDITION: Stable   Leotis Pain 01/10/2017 8:38 AM  This note was created with Dragon Medical transcription system. Any errors in dictation are purely unintentional.

## 2017-01-10 NOTE — Discharge Instructions (Signed)
Inferior Vena Cava Filter Insertion, Care After °This sheet gives you information about how to care for yourself after your procedure. Your health care provider may also give you more specific instructions. If you have problems or questions, contact your health care provider. °What can I expect after the procedure? °After your procedure, it is common to have: °· Mild pain in the area where the filter was inserted. °· Mild bruising in the area where the filter was inserted. ° °Follow these instructions at home: °Insertion site care °· Follow instructions from your health care provider about how to take care of the site where a catheter was inserted at your neck or groin (insertion site). Make sure you: °? Wash your hands with soap and water before you change your bandage (dressing). If soap and water are not available, use hand sanitizer. °? Change your dressing as told by your health care provider. °· Check your insertion site every day for signs of infection. Check for: °? More redness, swelling, or pain. °? More fluid or blood. °? Warmth. °? Pus or a bad smell. °· Keep the insertion site clean and dry. °· Do not shower, bathe, use a hot tub, or let the dressing get wet until your health care provider approves. °General instructions °· Take over-the-counter and prescription medicines only as told by your health care provider. °· Avoid heavy lifting or hard activities for 48 hours after the procedure or as told by your health care provider. °· Do not drive for 24 hours if you were given a a medicine to help you relax (sedative). °· Do not drive or use heavy machinery while taking prescription pain medicine. °· Do not go back to school or work until your health care provider approves. °· Keep all follow-up visits as told by your health care provider. This is important. °Contact a health care provider if: °· You have more redness, swelling, or pain around your insertion site. °· You have more fluid or blood coming  from your insertion site. °· Your insertion site feels warm to the touch. °· You have pus or a bad smell coming from your insertion site. °· You have a fever. °· You are dizzy. °· You have nausea and vomiting. °· You develop a rash. °Get help right away if: °· You develop chest pain, a cough, or difficulty breathing. °· You develop shortness of breath, feel faint, or pass out. °· You cough up blood. °· You have severe pain in your abdomen. °· You develop swelling and discoloration or pain in your legs. °· Your legs become pale and cold or blue. °· You develop weakness, difficulty moving your arms or legs, or balance problems. °· You develop problems with speech or vision. °These symptoms may represent a serious problem that is an emergency. Do not wait to see if the symptoms will go away. Get medical help right away. Call your local emergency services (911 in the [place] Do not drive yourself to the hospital. °Summary °· After your insertion procedure, it is common to have mild pain and bruising. °· Do not shower, bathe, use a hot tub, or let the dressing get wet until your health care provider approves. °· Every day, check for signs of infection where a catheter was inserted at your neck or groin (insertion site). °This information is not intended to replace advice given to you by your health care provider. Make sure you discuss any questions you have with your health care provider. °Document Released: 04/11/2013 Document   Revised: 05/12/2016 Document Reviewed: 05/12/2016 °Elsevier Interactive Patient Education © 2017 [employer] Inc[employer] ° °

## 2017-01-26 ENCOUNTER — Encounter: Payer: Self-pay | Admitting: Urology

## 2017-01-26 ENCOUNTER — Other Ambulatory Visit: Payer: Self-pay | Admitting: Family Medicine

## 2017-01-26 ENCOUNTER — Ambulatory Visit
Admission: RE | Admit: 2017-01-26 | Discharge: 2017-01-26 | Disposition: A | Discharge status: Home / Self Care | Payer: Medicare Other | Source: Ambulatory Visit | Attending: Urology | Admitting: Urology

## 2017-01-26 ENCOUNTER — Ambulatory Visit: Payer: Medicare Other | Admitting: Urology

## 2017-01-26 VITALS — BP 170/88 | HR 66 | Ht 72.0 in | Wt 193.0 lb

## 2017-01-26 DIAGNOSIS — N200 Calculus of kidney: Secondary | ICD-10-CM | POA: Insufficient documentation

## 2017-01-26 DIAGNOSIS — C679 Malignant neoplasm of bladder, unspecified: Secondary | ICD-10-CM | POA: Diagnosis not present

## 2017-01-26 LAB — MICROSCOPIC EXAMINATION: Bacteria, UA: NONE SEEN

## 2017-01-26 LAB — URINALYSIS, COMPLETE
Bilirubin, UA: NEGATIVE
Ketones, UA: NEGATIVE
Leukocytes, UA: NEGATIVE
Nitrite, UA: NEGATIVE
Protein, UA: NEGATIVE
Specific Gravity, UA: 1.025 (ref 1.005–1.030)
Urobilinogen, Ur: 0.2 mg/dL (ref 0.2–1.0)
pH, UA: 5 (ref 5.0–7.5)

## 2017-01-26 MED ORDER — LIDOCAINE HCL 2 % EX GEL
1.0000 "application " | Freq: Once | CUTANEOUS | Status: AC
  Administered 2017-01-26: 1 via URETHRAL

## 2017-01-26 MED ORDER — CIPROFLOXACIN HCL 500 MG PO TABS
500.0000 mg | ORAL_TABLET | Freq: Once | ORAL | Status: AC
  Administered 2017-01-26: 500 mg via ORAL

## 2017-01-26 NOTE — Progress Notes (Signed)
9:20 AM  01/26/17  Cory Hardin 1946-01-21 [id number]  Referring provider: Albina Billet, MD [address]   [place], [place] [address]   HPI: 71 yo M w/ recurrent nephrolithiasis, bladder stones, recurrent bladder CA, BPH s/p TURP, history of elevated PSA who returns for routine follow up/ cystoscopy.  Since his last visit, he had a massive PE requiring thrombectomy and IVC placement.  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 He has a recurrence and undenwent TURBT in 08/2014, pathology LgTA.  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  S/p BCG x 6 completed 02/2015.  Cysto 06/2015 suspicious for recurrent, TURBT c/w granulomatous inflammation/ chronic cystitis.  Urine cytology from 07/2016 dysplastic without cystoscopic cystoscopic findings.  Repeat urine cytology in 09/2016 negative.  Returns today for routine cystoscopy, cytology.  Nephrolithiasis: He has an extensive history of recurrent stones. He is undergone multiple stone procedures in the past. 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   Most recent intervention s/p R URS, LL on 06/23/15 s/p URS.   Follow-up renal ultrasound from 09/2015 shows no hydronephrosis. Bilateral nonobstructing stones present.Marland Kitchen KUB 01/2016  with 1.6 cm LLP stone, increased from 1.3 6 months ago.  Right small stone stable.  KUB 07/2016 stable. New gall stones appreciated.  KUB today, stable unchanged.    He has been having intermittent right lower quadrant pain for the past several months with bending.  No associated voiding symptoms, nausea, or vomiting.  BPH: History of BPH, bladder stones S/p TURP 04/2014.  Voiding well.    Elevated PSA: PSA elevated on 10/10/15 to 4.3,  3.3 7/17, and 2.9 on 10/217.  DRE 7/17 unremarkable.   PMH: Past Medical History:  Diagnosis Date  .  Bilateral kidney stones   . BPH (benign prostatic hyperplasia)   . Cancer ([place])    bladder  . Cancer of lateral wall of urinary bladder ([place]) 12/05/2014  . Chronic kidney disease    stones,renal failure  . Diabetes mellitus without complication ([place])   . DVT (deep venous thrombosis) ([place])   . GERD (gastroesophageal reflux disease)   . Hematuria   . Hyperlipidemia   . Hypertension     Surgical History: Past Surgical History:  Procedure Laterality Date  . APPENDECTOMY    . bladder stones    . CYSTOSCOPY    . CYSTOSCOPY WITH BIOPSY N/A 11/20/2014   Procedure: CYSTOSCOPY WITH BIOPSY;  Surgeon: [name], MD;  Location: [facility];  Service: Urology;  Laterality: N/A;  . CYSTOSCOPY WITH STENT PLACEMENT Right 06/23/2015   Procedure: CYSTOSCOPY WITH STENT PLACEMENT;  Surgeon: [name], MD;  Location: [facility];  Service: Urology;  Laterality: Right;  . IVC FILTER INSERTION N/A 08/11/2016   Procedure: IVC Filter Insertion;  Surgeon: Algernon Huxley, MD;  Location: [facility];  Service: Cardiovascular;  Laterality: N/A;  . IVC FILTER REMOVAL N/A 01/10/2017   Procedure: IVC Filter Removal;  Surgeon: Algernon Huxley, MD;  Location: [facility];  Service: Cardiovascular;  Laterality: N/A;  . PERCUTANEOUS NEPHROLITHOTRIPSY    . PULMONARY THROMBECTOMY N/A 08/11/2016   Procedure: Pulmonary Thrombectomy;  Surgeon: Algernon Huxley, MD;  Location: [facility];  Service: Cardiovascular;  Laterality: N/A;  . TRANSURETHRAL RESECTION OF BLADDER TUMOR N/A 06/23/2015   Procedure: TRANSURETHRAL RESECTION OF BLADDER  TUMOR (TURBT);  Surgeon: [name], MD;  Location: [facility];  Service: Urology;  Laterality: N/A;  . TRANSURETHRAL RESECTION OF BLADDER TUMOR WITH GYRUS (TURBT-GYRUS)    . URETEROSCOPY WITH HOLMIUM LASER LITHOTRIPSY Right 06/23/2015   Procedure: URETEROSCOPY WITH HOLMIUM LASER LITHOTRIPSY;  Surgeon: [name], MD;  Location: [facility];  Service: Urology;  Laterality:  Right;    Home Medications:  Allergies as of 01/26/2017   No Known Allergies     Medication List       Accurate as of 01/26/17  9:20 AM. Always use your most recent med list.          apixaban 5 MG Tabs tablet Commonly known as:  ELIQUIS 10 mg po bid for 6 days, then 5 mg po bid.   carvedilol 12.5 MG tablet Commonly known as:  COREG Take 12.5 mg by mouth 2 (two) times daily with a meal.   fenofibrate 160 MG tablet Take 160 mg by mouth daily.   glimepiride 1 MG tablet Commonly known as:  AMARYL Take 1 mg by mouth daily.   hydrochlorothiazide 25 MG tablet Commonly known as:  HYDRODIURIL TAKE ONE TABLET BY MOUTH ONCE DAILY   omeprazole 20 MG capsule Commonly known as:  PRILOSEC Take 20 mg by mouth daily.   pravastatin 40 MG tablet Commonly known as:  PRAVACHOL Take 40 mg by mouth daily. pm       Allergies: No Known Allergies  Family History: Family History  Problem Relation Age of Onset  . Heart disease Father   . Heart disease Mother   . Heart failure Mother   . COPD Mother   . Cancer Maternal Aunt   . Heart disease Brother   . Diabetes Brother   . Kidney disease Neg Hx   . Prostate cancer Neg Hx     Social History:  reports that he quit smoking about 34 years ago. His smoking use included Cigarettes. He has a 3.75 pack-year smoking history. He has never used smokeless tobacco. He reports that he drinks about 0.6 oz of alcohol per week . He reports that he does not use drugs.  Physical Exam: There were no vitals taken for this visit.  Constitutional:  Alert and oriented, No acute distress. HEENT: [place] AT, moist mucus membranes.  Trachea midline, no masses. Cardiovascular: No clubbing, cyanosis, or edema.  Respiratory: Normal respiratory effort, no increased work of breathing.  GI: Abdomen is soft, nontender, nondistended, no abdominal masses.  Bulging in the right inguinal region appreciated with Valsalva. GU: No CVA tenderness.  Normal circumcised  phallus. Skin: No rashes, bruises or suspicious lesions. Neurologic: Grossly intact, no focal deficits, moving all 4 extremities. Psychiatric: Normal mood and affect.  Laboratory Data: Lab Results  Component Value Date   WBC 9.1 08/13/2016   HGB 12.6 (L) 08/13/2016   HCT 36.1 (L) 08/13/2016   MCV 90.9 08/13/2016   PLT 287 08/13/2016    Lab Results  Component Value Date   CREATININE 1.15 08/12/2016    Component     Latest Ref Rng & Units 10/10/2015 01/16/2016 04/19/2016  PSA     0.0 - 4.0 ng/mL 4.3 (H) 3.3 2.9   Urinalysis Reviewed, no evidence of infection   Cystoscopy Procedure Note  Patient identification was confirmed, informed consent was obtained, and patient was prepped using Betadine solution. Lidocaine jelly was administered per urethral meatus.   Preoperative abx where received prior to procedure.   Pre-Procedure: - Inspection reveals a normal caliber  ureteral meatus.  Procedure: The flexible cystoscope was introduced without difficulty - No urethral strictures/lesions are present. - TURP defect noted of prostate, widely patent - Normal bladder neck - Bilateral ureteral orifices identified - Several stellate scar like lesions including on the posterior wall and right lateral wall. No ulceration or calcifications appreciated today.  Otherwise no other lesions, tumors, or masses.  - No bladder stones - No trabeculation  Retroflexion unremarkable.   Post-Procedure: - Patient tolerated the procedure well   Post-Procedure: - Patient tolerated the procedure well  Assessment & Plan:    1.History of bladder cancer Recurrent low-grade Ta bladder cancer first diagnosed 03/07/2014 with recurrence LgTa 2/016. 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  He completed 6 week course of BCG  completed  02/2015.  Repeat TUR 06/2015 negative for malignancy. NED today on  cysto Cytology pending Cysto in 6 months   2. Elevated PSA Most recent PSA/rectal exam lower, non-concerning Given his multiple medical comorbidities as well as age, we discussed discontinuation of PSA screening today which the patient is agreeable to  3. Calcium nephrolithiasis Status post multiple procedures including ureteroscopy 2, ESWL, and remote history of PCNL and most recent right URS, LL, stent on 06/2015.   Bilateral nephrolithiasis on RUS/ KUB today essentially stable over the past year Call if develops flank pain sooner HCTZ 25mg  continue, consider referral to [facility] (Dr. Fabio Asa) for further evaluation of active metabolic stone formation KUB in 1 year to assess stability Right lower quadrant pain unlikely related to stone, not visualized on KUB, bulging in the right inguinal area with possible fat containing hernia   4. BPH (benign prostatic hyperplasia) s/p TURP on 04/17/14. Pathology negative. Voiding well   F/u in 6 months for cystoscopy  [facility] [address] [place], [place] [address] [phone]

## 2017-02-02 ENCOUNTER — Other Ambulatory Visit: Payer: Self-pay | Admitting: Urology

## 2017-03-11 ENCOUNTER — Encounter (INDEPENDENT_AMBULATORY_CARE_PROVIDER_SITE_OTHER): Payer: Self-pay | Admitting: Vascular Surgery

## 2017-03-11 ENCOUNTER — Ambulatory Visit (INDEPENDENT_AMBULATORY_CARE_PROVIDER_SITE_OTHER): Payer: Medicare Other | Admitting: Vascular Surgery

## 2017-03-11 VITALS — BP 149/89 | HR 67 | Resp 15 | Ht 72.0 in | Wt 202.0 lb

## 2017-03-11 DIAGNOSIS — C679 Malignant neoplasm of bladder, unspecified: Secondary | ICD-10-CM

## 2017-03-11 DIAGNOSIS — I2609 Other pulmonary embolism with acute cor pulmonale: Secondary | ICD-10-CM

## 2017-03-11 DIAGNOSIS — I87009 Postthrombotic syndrome without complications of unspecified extremity: Secondary | ICD-10-CM

## 2017-03-11 DIAGNOSIS — I2782 Chronic pulmonary embolism: Secondary | ICD-10-CM

## 2017-03-11 NOTE — Progress Notes (Signed)
MRN : [id number]  Cory Hardin is a 71 y.o. (1945/07/16) male who presents with chief complaint of No chief complaint on file. Marland Kitchen  History of Present Illness: Patient returns today in follow up of DVT and PE. He had a second, unprovoked DVT with pulmonary embolus about 6 months ago. He is remain on anticoagulation and has tolerated this well. He had venous intervention including an IVC filter which has since been removed. He is doing well today. He has minimal chest pain or shortness of breath at this point. He has no bleeding issues on anticoagulation. He has had bladder tumors but has not had any recent hematuria. His leg swelling at this point is very mild and not that bothersome  Current Outpatient Prescriptions  Medication Sig Dispense Refill  . apixaban (ELIQUIS) 5 MG TABS tablet 10 mg po bid for 6 days, then 5 mg po bid. (Patient taking differently: Take 5 mg by mouth 2 (two) times daily. ) 60 tablet 2  . carvedilol (COREG) 12.5 MG tablet Take 12.5 mg by mouth 2 (two) times daily with a meal.    . fenofibrate 160 MG tablet Take 160 mg by mouth daily.     Marland Kitchen glimepiride (AMARYL) 1 MG tablet Take 1 mg by mouth daily.     . hydrochlorothiazide (HYDRODIURIL) 25 MG tablet TAKE ONE TABLET BY MOUTH ONCE DAILY 90 tablet 3  . omeprazole (PRILOSEC) 20 MG capsule Take 20 mg by mouth daily.     . pravastatin (PRAVACHOL) 40 MG tablet Take 40 mg by mouth daily. pm     No current facility-administered medications for this visit.     Past Medical History:  Diagnosis Date  . Bilateral kidney stones   . BPH (benign prostatic hyperplasia)   . Cancer ([place])    bladder  . 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 Chronic kidney disease    stones,renal failure  . Diabetes mellitus without complication ([place])   . DVT (deep venous thrombosis) ([place])   . GERD (gastroesophageal reflux disease)   . Hematuria   . Hyperlipidemia   . Hypertension     Past Surgical History:  Procedure  Laterality Date  . APPENDECTOMY    . bladder stones    . CYSTOSCOPY    . CYSTOSCOPY WITH BIOPSY N/A 11/20/2014   Procedure: CYSTOSCOPY WITH BIOPSY;  Surgeon: [name], MD;  Location: [facility];  Service: Urology;  Laterality: N/A;  . CYSTOSCOPY WITH STENT PLACEMENT Right 06/23/2015   Procedure: CYSTOSCOPY WITH STENT PLACEMENT;  Surgeon: [name], MD;  Location: [facility];  Service: Urology;  Laterality: Right;  . IVC FILTER INSERTION N/A 08/11/2016   Procedure: IVC Filter Insertion;  Surgeon: Algernon Huxley, MD;  Location: [facility];  Service: Cardiovascular;  Laterality: N/A;  . IVC FILTER REMOVAL N/A 01/10/2017   Procedure: IVC Filter Removal;  Surgeon: Algernon Huxley, MD;  Location: [facility];  Service: Cardiovascular;  Laterality: N/A;  . PERCUTANEOUS NEPHROLITHOTRIPSY    . PULMONARY THROMBECTOMY N/A 08/11/2016   Procedure: Pulmonary Thrombectomy;  Surgeon: Algernon Huxley, MD;  Location: [facility];  Service: Cardiovascular;  Laterality: N/A;  . TRANSURETHRAL RESECTION OF BLADDER TUMOR N/A 06/23/2015   Procedure: TRANSURETHRAL RESECTION OF BLADDER TUMOR (TURBT);  Surgeon: [name], MD;  Location: [facility];  Service: Urology;  Laterality: N/A;  . TRANSURETHRAL RESECTION OF BLADDER TUMOR WITH GYRUS (TURBT-GYRUS)    . URETEROSCOPY WITH  HOLMIUM LASER LITHOTRIPSY Right 06/23/2015   Procedure: URETEROSCOPY WITH HOLMIUM LASER LITHOTRIPSY;  Surgeon: [name], MD;  Location: [facility];  Service: Urology;  Laterality: Right;     Social History        Social History  Substance Use Topics  . Smoking status: Former Smoker    Packs/day: 0.25    Years: 15.00    Types: Cigarettes    Quit date: 11/13/1982  . Smokeless tobacco: Never Used  . Alcohol use 0.6 oz/week     1 Standard drinks or equivalent per week      Comment: rare     Family History      Family History  Problem Relation Age of Onset  . Heart disease Father     . Heart disease Mother   . Cancer Maternal Aunt   . Heart disease Brother   . Kidney disease Neg Hx   . Prostate cancer Neg Hx      No Known Allergies   REVIEW OF SYSTEMS(Negative unless checked)  Constitutional: [] Weight loss[] Fever[] Chills Cardiac:[] Chest pain[] Chest pressure[] Palpitations [] Shortness of breath when laying flat [] Shortness of breath at rest [] Shortness of breath with exertion. Vascular: [] Pain in legs with walking[] Pain in legsat rest[] Pain in legs when laying flat [] Claudication [] Pain in feet when walking [] Pain in feet at rest [] Pain in feet when laying flat [x] History of DVT [x] Phlebitis [x] Swelling in legs [] Varicose veins [] Non-healing ulcers Pulmonary: [] Uses home oxygen [] Productive cough[] Hemoptysis [] Wheeze [] COPD [] Asthma Neurologic: [] Dizziness [] Blackouts [] Seizures [] History of stroke [] History of TIA[] Aphasia [] Temporary blindness[] Dysphagia [] Weaknessor numbness in arms [] Weakness or numbnessin legs Musculoskeletal: [] Arthritis [] Joint swelling [] Joint pain [] Low back pain Hematologic:[] Easy bruising[] Easy bleeding [] Hypercoagulable state [] Anemic  Gastrointestinal:[] Blood in stool[] Vomiting blood[] Gastroesophageal reflux/heartburn[] Abdominal pain Genitourinary: [] Chronic kidney disease [] Difficulturination [] Frequenturination [] Burning with urination[] Hematuria X Bladder tumor Skin: [] Rashes [] Ulcers [] Wounds Psychological: [] History of anxiety[] History of major depression.   Physical Examination  There were no vitals taken for this visit. Gen:  WD/WN, NAD Head: [place]/AT, No temporalis wasting. Ear/Nose/Throat: Hearing grossly intact, nares w/o erythema or drainage, trachea midline Eyes: Conjunctiva clear. Sclera non-icteric Neck: Supple.  No JVD.  Pulmonary:  Good air movement, no use of accessory muscles.  Cardiac:  RRR, normal S1, S2 Vascular:  Vessel Right Left  Radial Palpable Palpable                          PT Palpable Palpable  DP Palpable Palpable   Musculoskeletal: M/S 5/5 throughout.  No deformity or atrophy. Trace LE edema. Neurologic: Sensation grossly intact in extremities.  Symmetrical.  Speech is fluent.  Psychiatric: Judgment intact, Mood & affect appropriate for pt's clinical situation. Dermatologic: No rashes or ulcers noted.  No cellulitis or open wounds.       Labs Recent Results (from the past 2160 hour(s))  Glucose, capillary     Status: Abnormal   Collection Time: 01/10/17  7:18 AM  Result Value Ref Range   Glucose-Capillary 135 (H) 65 - 99 mg/dL  Urinalysis, Complete     Status: Abnormal   Collection Time: 01/26/17  9:13 AM  Result Value Ref Range   Specific Gravity, UA 1.025 1.005 - 1.030   pH, UA 5.0 5.0 - 7.5   Color, UA Yellow Yellow   Appearance Ur Clear Clear   Leukocytes, UA Negative Negative   Protein, UA Negative Negative/Trace   Glucose, UA Trace (A) Negative   Ketones, UA Negative Negative   RBC, UA 2+ (A) Negative   Bilirubin, UA  Negative Negative   Urobilinogen, Ur 0.2 0.2 - 1.0 mg/dL   Nitrite, UA Negative Negative   Microscopic Examination See below:   Microscopic Examination     Status: Abnormal   Collection Time: 01/26/17  9:13 AM  Result Value Ref Range   WBC, UA 0-5 0 - 5 /hpf   RBC, UA 3-10 (A) 0 - 2 /hpf   Epithelial Cells (non renal) 0-10 0 - 10 /hpf   Casts Present (A) None seen /lpf   Cast Type Hyaline casts N/A   Mucus, UA Present (A) Not Estab.   Bacteria, UA None seen None seen/Few    Radiology No results found.    Assessment/Plan Bladder cancer Followed by Urology  Hyperlipidemia lipid control important in reducing the progression of atherosclerotic disease. Continue statin therapy  Pulmonary embolism ([place]) He has been on full anticoagulation for over 5 months and now with a second unprovoked thrombotic  incident, would likely need lifelong anticoagulation. At a minimum, he will need one year of anticoagulation. He agrees with indefinite therapy and a new Rx was given today.  RTC one year.  Uncomplicated postphlebitic syndrome Swelling currently pretty mild. Compression stockings and elevation as needed.  No problem-specific Assessment & Plan notes found for this encounter.    Leotis Pain, MD  03/11/2017 1:14 PM    This note was created with Dragon medical transcription system.  Any errors from dictation are purely unintentional

## 2017-07-20 ENCOUNTER — Ambulatory Visit: Payer: Medicare Other | Admitting: Urology

## 2017-07-20 ENCOUNTER — Encounter: Payer: Self-pay | Admitting: Urology

## 2017-07-20 VITALS — BP 165/90 | HR 63 | Ht 72.0 in | Wt 201.0 lb

## 2017-07-20 DIAGNOSIS — N400 Enlarged prostate without lower urinary tract symptoms: Secondary | ICD-10-CM

## 2017-07-20 DIAGNOSIS — C679 Malignant neoplasm of bladder, unspecified: Secondary | ICD-10-CM

## 2017-07-20 DIAGNOSIS — N200 Calculus of kidney: Secondary | ICD-10-CM

## 2017-07-20 LAB — MICROSCOPIC EXAMINATION: RBC, UA: >30 /hpf — ABNORMAL HIGH (ref 0–?)

## 2017-07-20 LAB — URINALYSIS, COMPLETE
Bilirubin, UA: NEGATIVE
Glucose, UA: NEGATIVE
Ketones, UA: NEGATIVE
Leukocytes, UA: NEGATIVE
Nitrite, UA: NEGATIVE
Specific Gravity, UA: 1.025 (ref 1.005–1.030)
Urobilinogen, Ur: 0.2 mg/dL (ref 0.2–1.0)
pH, UA: 5.5 (ref 5.0–7.5)

## 2017-07-20 MED ORDER — LIDOCAINE HCL 2 % EX GEL
1.0000 "application " | Freq: Once | CUTANEOUS | Status: AC
  Administered 2017-07-20: 1 via URETHRAL

## 2017-07-20 MED ORDER — CIPROFLOXACIN HCL 500 MG PO TABS
500.0000 mg | ORAL_TABLET | Freq: Once | ORAL | Status: AC
Start: 2017-07-20 — End: 2017-07-20
  Administered 2017-07-20: 500 mg via ORAL

## 2017-07-20 NOTE — Progress Notes (Signed)
9:50 AM  07/21/17  Cory Hardin [birth year] [id number]  Referring provider: Albina Billet, MD [address]   [place],  [address]   HPI: 72 yo M w/ recurrent nephrolithiasis, bladder stones, recurrent bladder CA, BPH s/p TURP, history of elevated PSA who returns for routine follow up/ cystoscopy.    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 He has a recurrence and undenwent TURBT in 08/2014, pathology LgTA.  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  S/p BCG x 6 completed 02/2015.  Cysto 06/2015 suspicious for recurrent, TURBT c/w granulomatous inflammation/ chronic cystitis.    Last cystoscopy 7/25 2018 was unremarkable.  He did have atypical cells in his cytology and have been alternating between normal and atypical on previous occasions.  He denies any gross hematuria or urinary symptoms.  Returns today for routine cystoscopy, cytology.  Nephrolithiasis: He has an extensive history of recurrent stones. He is undergone multiple stone procedures in the past. 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   Most recent intervention s/p R URS, LL on 06/23/15 s/p URS.   Follow-up renal ultrasound from 09/2015 shows no hydronephrosis. Bilateral nonobstructing stones present.Marland Kitchen KUB 01/2016  with 1.6 cm LLP stone, increased from 1.3 6 months ago.  Right small stone stable.  KUB 07/2016 stable. New gall stones appreciated.  KUB 01/2017 unchanged.     Since last visit, he believes he may have had one stone episode.  His pain lasted less than 24 hours and then resolved.  He has had no further flank pain or gross hematuria.  BPH: History of BPH, bladder stones S/p TURP 04/2014.  Voiding well.  No urinary complaints today.  Elevated PSA: PSA elevated on 10/10/15 to 4.3,  3.3 7/17, and 2.9 on 10/217.  DRE 7/17 unremarkable.   Elected discontinuation of PSA screening.   PMH: Past Medical History:  Diagnosis Date  . Bilateral kidney stones   . BPH (benign prostatic hyperplasia)   . Cancer ([place])    bladder  . 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 Chronic kidney disease    stones,renal failure  . Diabetes mellitus without complication ([place])   . DVT (deep venous thrombosis) ([place])   . GERD (gastroesophageal reflux disease)   . Hematuria   . Hyperlipidemia   . Hypertension     Surgical History: Past Surgical History:  Procedure Laterality Date  . APPENDECTOMY    . bladder stones    . CYSTOSCOPY    . CYSTOSCOPY WITH BIOPSY N/A 11/20/2014   Procedure: CYSTOSCOPY WITH BIOPSY;  Surgeon: [name], MD;  Location: [facility];  Service: Urology;  Laterality: N/A;  . CYSTOSCOPY WITH STENT PLACEMENT Right 06/23/2015   Procedure: CYSTOSCOPY WITH STENT PLACEMENT;  Surgeon: [name], MD;  Location: [facility];  Service: Urology;  Laterality: Right;  . IVC FILTER INSERTION N/A 08/11/2016   Procedure: IVC Filter Insertion;  Surgeon: Algernon Huxley, MD;  Location: [facility];  Service: Cardiovascular;  Laterality: N/A;  . IVC FILTER REMOVAL N/A 01/10/2017   Procedure: IVC Filter Removal;  Surgeon: Algernon Huxley, MD;  Location: [facility];  Service: Cardiovascular;  Laterality: N/A;  . PERCUTANEOUS NEPHROLITHOTRIPSY    . PULMONARY THROMBECTOMY N/A 08/11/2016   Procedure: Pulmonary Thrombectomy;  Surgeon: Algernon Huxley, MD;  Location: [facility];  Service: Cardiovascular;  Laterality: N/A;  . TRANSURETHRAL RESECTION OF BLADDER TUMOR N/A 06/23/2015   Procedure: TRANSURETHRAL RESECTION OF BLADDER TUMOR (TURBT);  Surgeon: [name], MD;  Location: [facility];  Service: Urology;  Laterality: N/A;  . TRANSURETHRAL RESECTION OF BLADDER TUMOR WITH GYRUS (TURBT-GYRUS)    . URETEROSCOPY WITH HOLMIUM LASER LITHOTRIPSY Right 06/23/2015   Procedure: URETEROSCOPY WITH HOLMIUM LASER  LITHOTRIPSY;  Surgeon: [name], MD;  Location: [facility];  Service: Urology;  Laterality: Right;    Home Medications:  Allergies as of 07/20/2017   No Known Allergies     Medication List        Accurate as of 07/20/17 11:59 PM. Always use your most recent med list.          ACCU-CHEK AVIVA PLUS test strip Generic drug:  glucose blood   apixaban 5 MG Tabs tablet Commonly known as:  ELIQUIS 10 mg po bid for 6 days, then 5 mg po bid.   carvedilol 12.5 MG tablet Commonly known as:  COREG Take 12.5 mg by mouth 2 (two) times daily with a meal.   fenofibrate 160 MG tablet Take 160 mg by mouth daily.   glimepiride 1 MG tablet Commonly known as:  AMARYL Take 1 mg by mouth daily.   hydrochlorothiazide 25 MG tablet Commonly known as:  HYDRODIURIL TAKE ONE TABLET BY MOUTH ONCE DAILY   omeprazole 20 MG capsule Commonly known as:  PRILOSEC Take 20 mg by mouth daily.   pravastatin 40 MG tablet Commonly known as:  PRAVACHOL Take 40 mg by mouth daily. pm       Allergies: No Known Allergies  Family History: Family History  Problem Relation Age of Onset  . Heart disease Father   . Heart disease Mother   . Heart failure Mother   . COPD Mother   . Cancer Maternal Aunt   . Heart disease Brother   . Diabetes Brother   . Kidney disease Neg Hx   . Prostate cancer Neg Hx     Social History:  reports that he quit smoking about 34 years ago. His smoking use included cigarettes. He has a 3.75 pack-year smoking history. he has never used smokeless tobacco. He reports that he drinks about 0.6 oz of alcohol per week. He reports that he does not use drugs.  Physical Exam: BP (!) 165/90 (BP Location: Left Arm, Patient Position: Sitting, Cuff Size: Normal)   Pulse 63   Ht 6' (1.829 m)   Wt 201 lb (91.2 kg)   BMI 27.26 kg/m   Constitutional:  Alert and oriented, No acute distress. HEENT: [place] AT, moist mucus membranes.  Trachea midline, no masses. Cardiovascular: No  clubbing, cyanosis, or edema.  Respiratory: Normal respiratory effort, no increased work of breathing.   GU: No CVA tenderness.  Normal circumcised phallus. Skin: No rashes, bruises or suspicious lesions. Neurologic: Grossly intact, no focal deficits, moving all 4 extremities. Psychiatric: Normal mood and affect.  Laboratory Data: Lab Results  Component Value Date   WBC 9.1 08/13/2016   HGB 12.6 (L) 08/13/2016   HCT 36.1 (L) 08/13/2016   MCV 90.9 08/13/2016   PLT 287 08/13/2016    Lab Results  Component Value Date   CREATININE 1.15 08/12/2016    Component     Latest Ref Rng & Units 10/10/2015 01/16/2016 04/19/2016  PSA     0.0 - 4.0 ng/mL 4.3 (H) 3.3 2.9   Urinalysis Reviewed, no evidence  of infection   Cystoscopy Procedure Note  Patient identification was confirmed, informed consent was obtained, and patient was prepped using Betadine solution. Lidocaine jelly was administered per urethral meatus.   Preoperative abx where received prior to procedure.   Pre-Procedure: - Inspection reveals a normal caliber ureteral meatus.  Procedure: The flexible cystoscope was introduced without difficulty - No urethral strictures/lesions are present. - TURP defect noted of prostate, widely patent - Normal bladder neck - Bilateral ureteral orifices identified - Several stellate scar like lesions including on the posterior wall and right lateral wall. No ulceration or calcifications appreciated today.  Otherwise no other lesions, tumors, or masses.  - No bladder stones - No trabeculation  Retroflexion unremarkable.   Post-Procedure: - Patient tolerated the procedure well   Post-Procedure: - Patient tolerated the procedure well  Assessment & Plan:    1.History of bladder cancer Recurrent low-grade Ta bladder cancer first diagnosed 03/07/2014 with recurrence LgTa 2/016. 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  He completed 6 week course of BCG  completed  02/2015.  Repeat TUR 06/2015 negative for malignancy. NED today on cysto Cytology pending -discussed last cytology being atypical of unknown significance.  If cytology remains atypical or frankly positive, will consider proceeding with biopsy although this may be problematic in light of recent PE/DVTs. Cysto in 6 months   2. Calcium nephrolithiasis Status post multiple procedures including ureteroscopy 2, ESWL, and remote history of PCNL and most recent right URS, LL, stent on 06/2015.   Bilateral nephrolithiasis on RUS/ KUB today essentially stable over the past year Call if develops flank pain sooner HCTZ 25mg  continue, consider referral to [facility] (Dr. Fabio Asa) for further evaluation of active metabolic stone formation KUB in 6 year to assess stability  3. BPH (benign prostatic hyperplasia) s/p TURP on 04/17/14. Pathology negative. Voiding well   Return in about 6 months (around 01/17/2018) for cysto, KUB.   [address] [place]  [place], [place] [address] [phone]

## 2017-07-25 ENCOUNTER — Other Ambulatory Visit: Payer: Self-pay | Admitting: Urology

## 2017-08-25 ENCOUNTER — Other Ambulatory Visit: Payer: Self-pay

## 2017-08-25 ENCOUNTER — Telehealth: Payer: Self-pay

## 2017-08-25 DIAGNOSIS — C679 Malignant neoplasm of bladder, unspecified: Secondary | ICD-10-CM

## 2017-08-25 MED ORDER — HYDROCHLOROTHIAZIDE 25 MG PO TABS: 25.0000 mg | ORAL_TABLET | Freq: Every day | ORAL | 3 refills | Status: DC

## 2017-08-25 NOTE — Telephone Encounter (Signed)
-----   Message from Lestine Box, LPN sent at 2/82/0813  4:26 PM EST ----- CLETUS!!!!!! I MISS YOU!!!!!!!!  I got a refill request from pt pharmacy for HCTZ that Dr. Erlene Quan originally started him on. I saw where you and doc changed some things with the meds. Are yall giving him the meds now or do I need to refill it?  LOVESSSSS YOUUUUU!!!!!

## 2018-01-16 IMAGING — CT CT ANGIO CHEST
1 of 6 series · 18 of 36 positions shown · IV contrast (isovue)
Comparison: None.

CLINICAL DATA: Severe right chest pain, back cough

EXAM:
CT ANGIOGRAPHY CHEST WITH CONTRAST
TECHNIQUE: Multidetector CT imaging of the chest was performed using the
standard protocol during bolus administration of intravenous
contrast. Multiplanar CT image reconstructions and MIPs were
obtained to evaluate the vascular anatomy.
CONTRAST:  60 mL Isovue 370

[Series 5: thins · axial · 0.85mm/px · z∈[-271,-15]mm · 18 of 286 slices shown]
[im 15/286  lung]
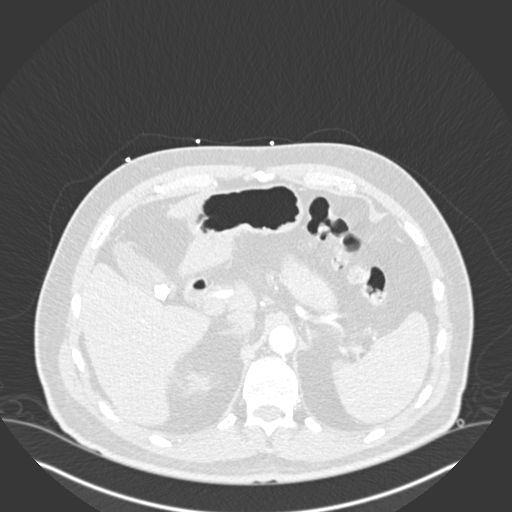
[im 29/286  mediastinal]
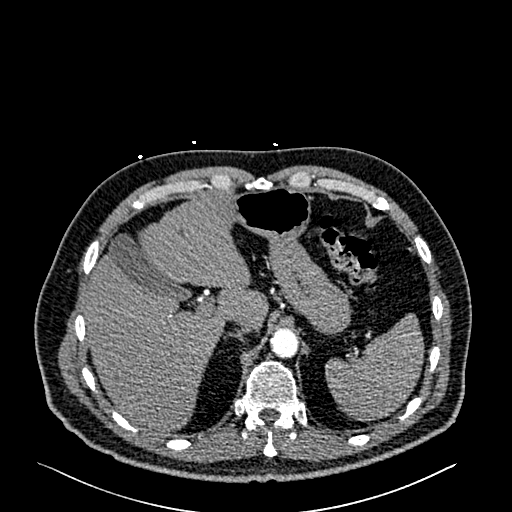
[im 43/286  lung]
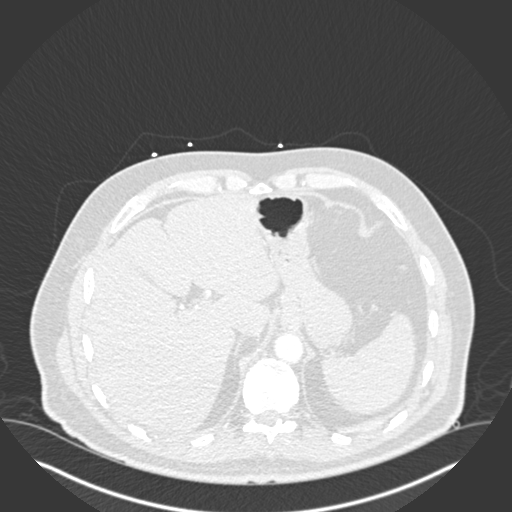
[im 58/286  mediastinal]
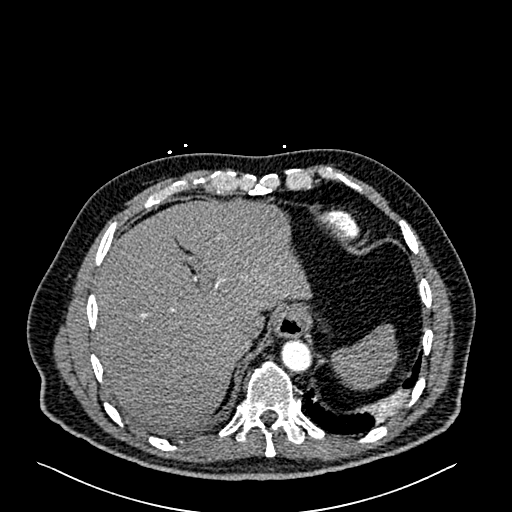
[im 72/286  lung]
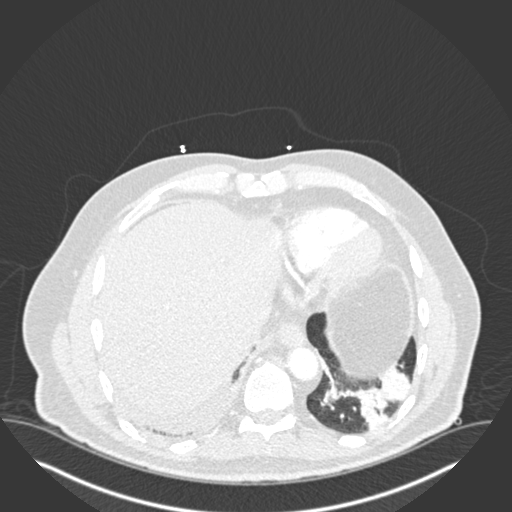
[im 86/286  mediastinal]
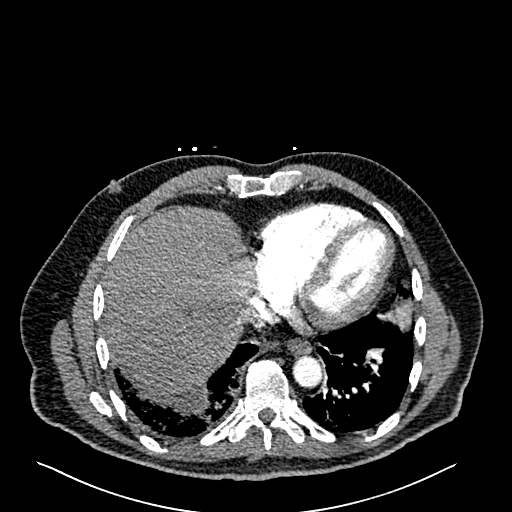
[im 100/286  lung]
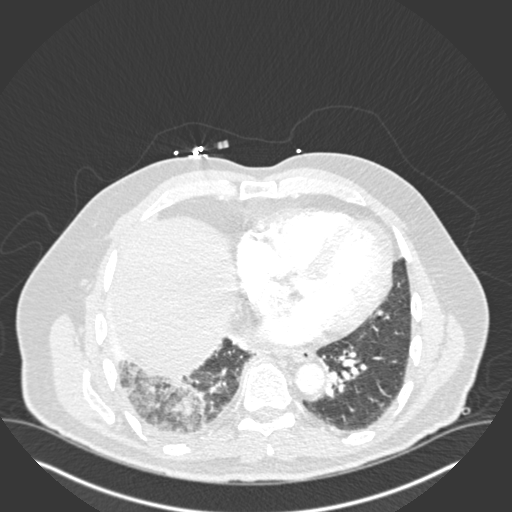
[im 115/286  mediastinal]
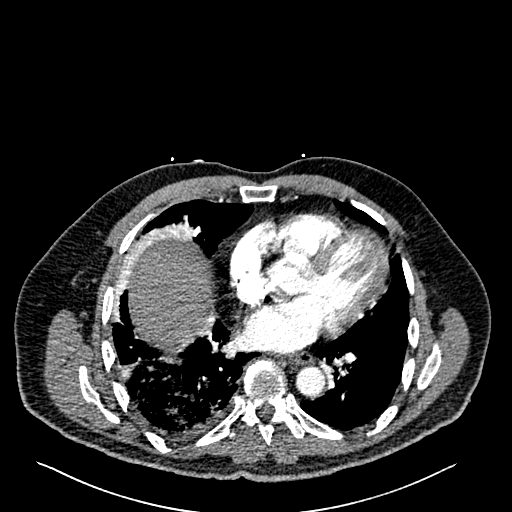
[im 129/286  lung]
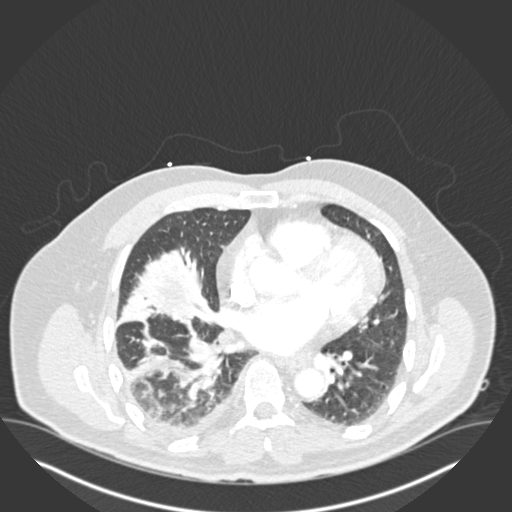
[im 157/286  mediastinal]
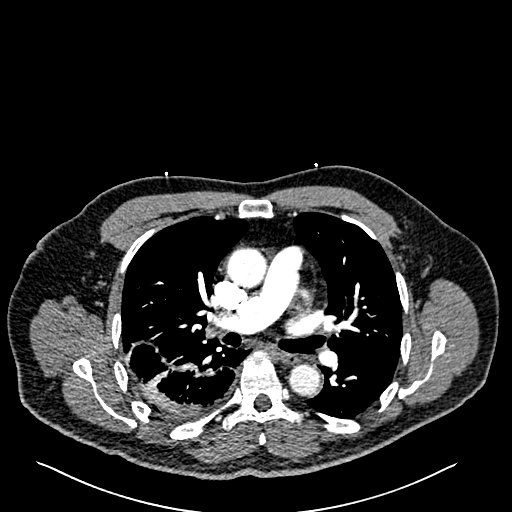
[im 172/286  lung]
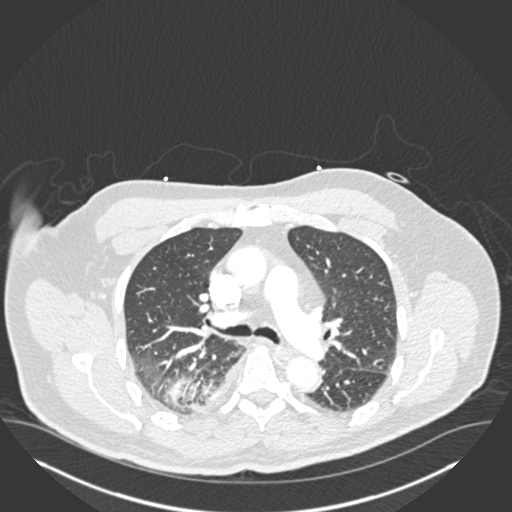
[im 186/286  mediastinal]
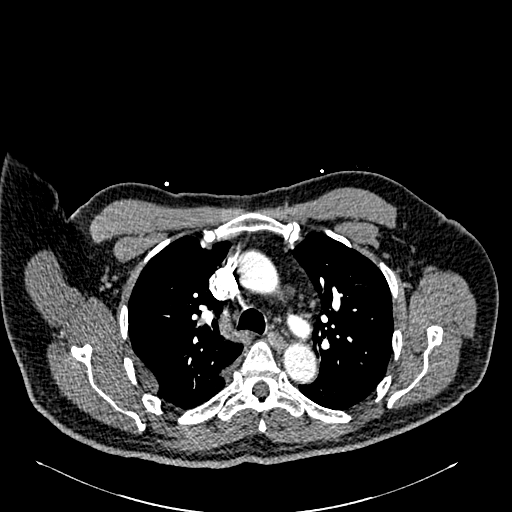
[im 200/286  lung]
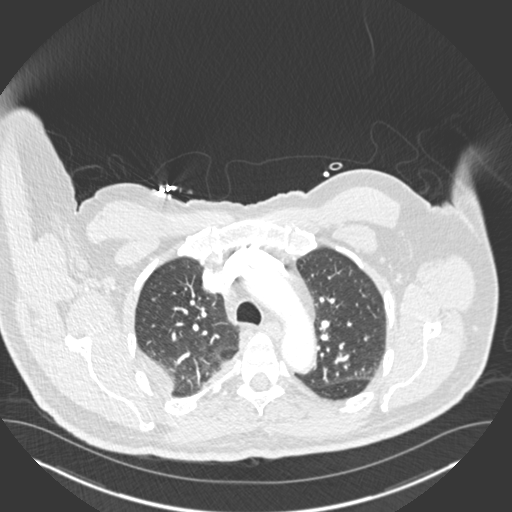
[im 214/286  mediastinal]
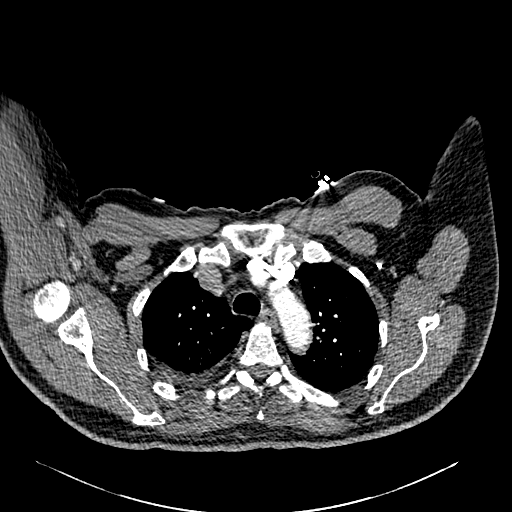
[im 229/286  lung]
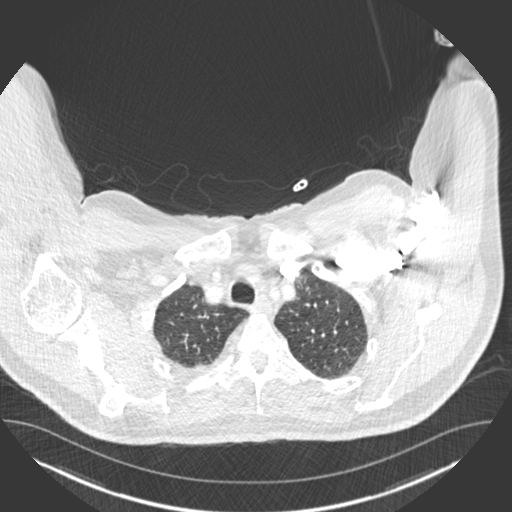
[im 243/286  mediastinal]
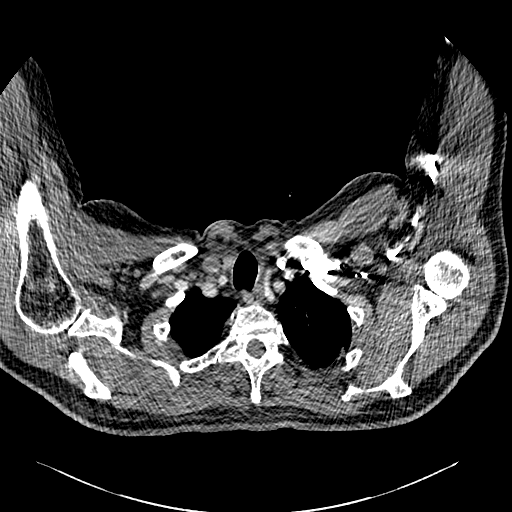
[im 257/286  lung]
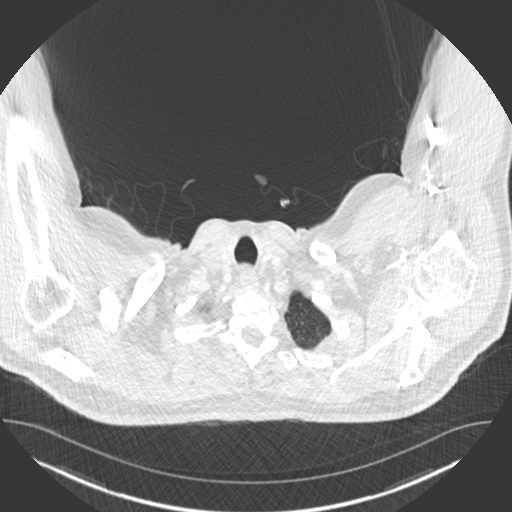
[im 271/286  mediastinal]
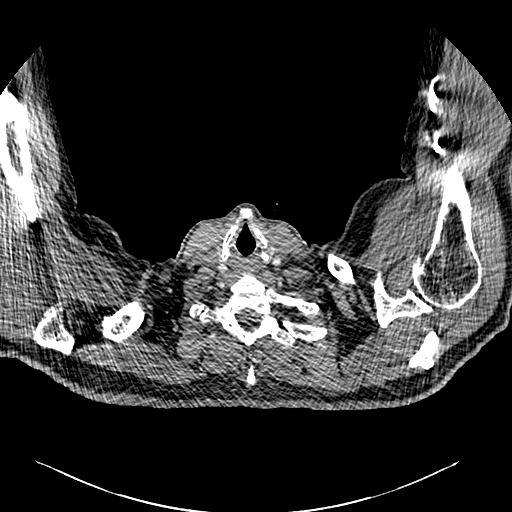

[18 of 36 positions shown; findings below may reference images not displayed]

FINDINGS: Cardiovascular: Satisfactory opacification of the pulmonary arteries
to the segmental level. Acute pulmonary embolus in the lobar,
segmental and subsegmental branches of the right lower lobe. Normal
heart size. No pericardial effusion.

Mediastinum/Nodes: No enlarged mediastinal, hilar, or axillary lymph
nodes. Thyroid gland, trachea, and esophagus demonstrate no
significant findings.

Lungs/Pleura: Small right pleural effusion. Right lower lobe
airspace disease likely reflecting atelectasis versus infarct. Left
lower lobe airspace disease likely reflecting atelectasis.

Upper Abdomen: No acute upper abdominal abnormality. Cholelithiasis.
Small hiatal hernia.

Musculoskeletal: No acute osseous abnormality. No lytic or sclerotic
osseous lesion.

Review of the MIP images confirms the above findings.
IMPRESSION: 1. Acute pulmonary embolus involving the lobar, segmental and
subsegmental branches of the right lower lobe. Positive for acute PE
with CT evidence of right heart strain (RV/LV Ratio = 1.2)
consistent with at least submassive (intermediate risk) PE. The
presence of right heart strain has been associated with an increased
risk of morbidity and mortality. Please activate Code PE by paging
[phone].
2. Small right pleural effusion. Right lower lobe airspace disease
likely reflecting atelectasis versus infarct.
3. Cholelithiasis.
Critical Value/emergent results were called by telephone at the time
of interpretation on 08/09/2016 at [DATE] to Dr. SOBIT DANISH , who
verbally acknowledged these results.

## 2018-01-16 IMAGING — CR DG CHEST 2V
2 series · 2 of 2 positions shown · non-contrast
Comparison: 05/23/2001 CXR report

CLINICAL DATA: Sick with cough and congestion

EXAM:
CHEST  2 VIEW

[chest pa]
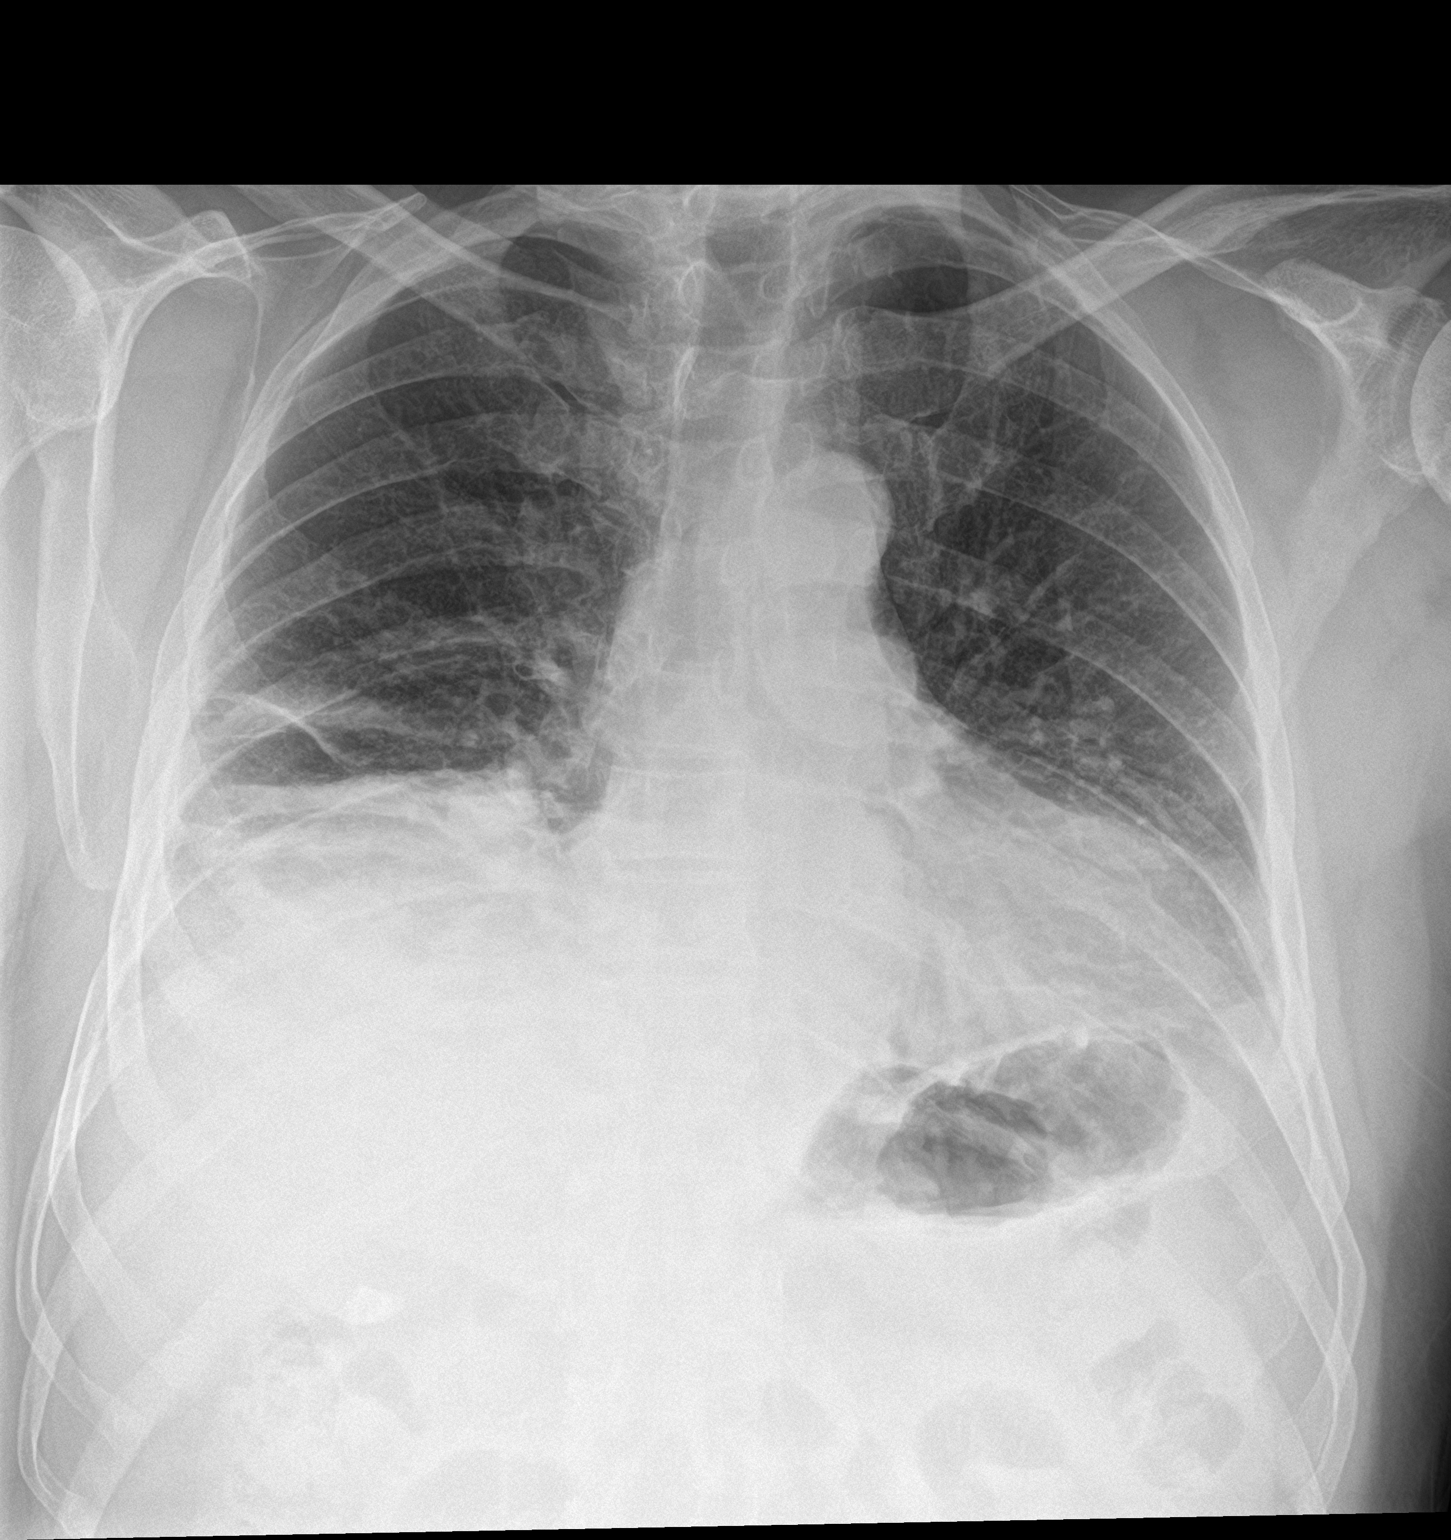

[chest lat]
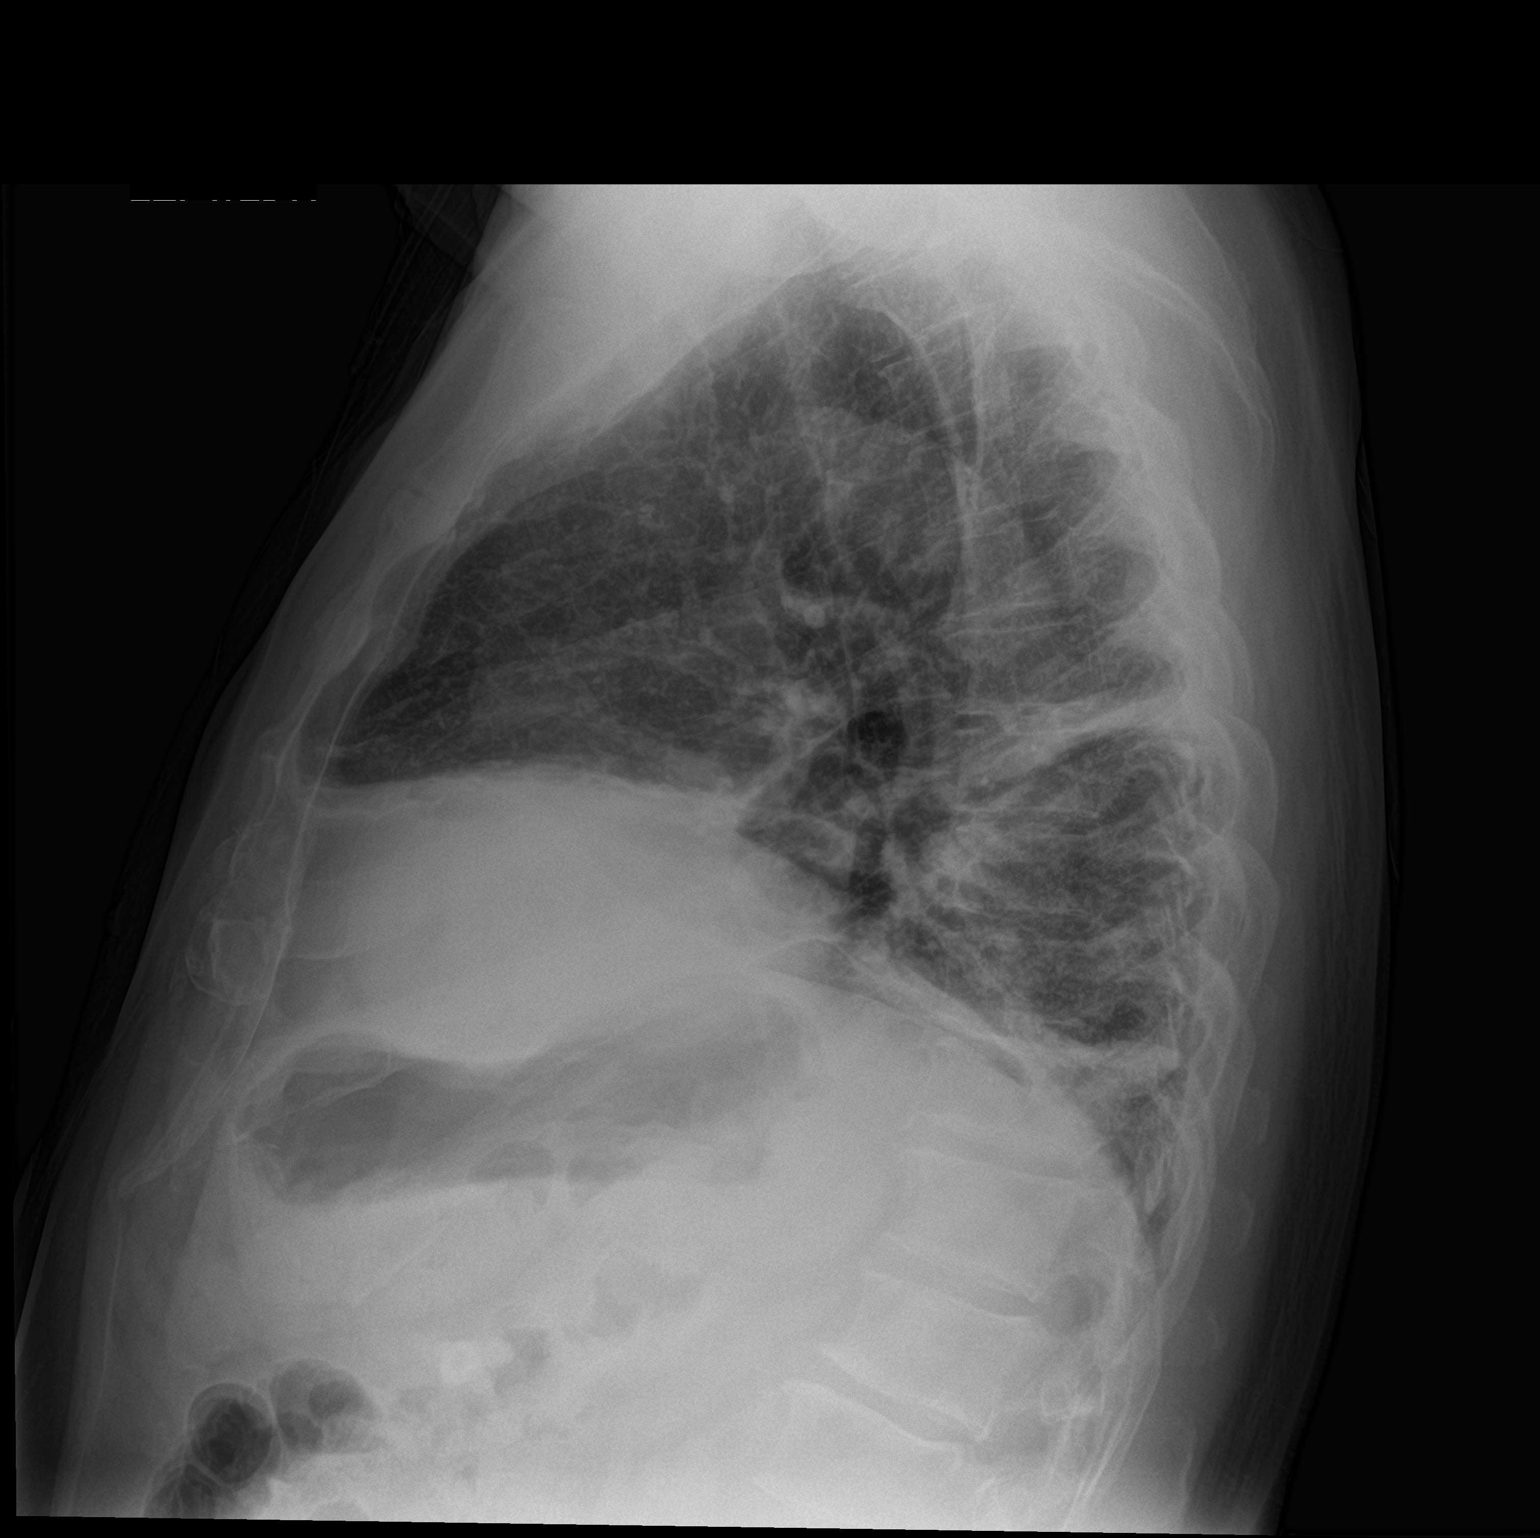

[2 of 2 positions shown; findings below may reference images not displayed]

FINDINGS: Heart is top normal. No aortic aneurysm. There is mild aortic
atherosclerosis. Low lung volumes with slight elevation of right
hemidiaphragm. Bibasilar atelectasis and/or scarring is noted. Trace
fluid within the right major fissure. No pulmonary consolidation or
CHF. No acute osseous abnormality. Mild degenerative changes along
the dorsal spine.
IMPRESSION: No active cardiopulmonary disease. Bibasilar atelectasis and/or
scarring. Trace fluid along the right major fissure.

## 2018-01-18 ENCOUNTER — Ambulatory Visit
Admission: RE | Admit: 2018-01-18 | Discharge: 2018-01-18 | Disposition: A | Discharge status: Home / Self Care | Payer: Medicare Other | Source: Ambulatory Visit | Attending: Urology | Admitting: Urology

## 2018-01-18 ENCOUNTER — Other Ambulatory Visit: Payer: Self-pay | Admitting: Radiology

## 2018-01-18 ENCOUNTER — Encounter: Payer: Self-pay | Admitting: Urology

## 2018-01-18 ENCOUNTER — Ambulatory Visit: Payer: Medicare Other | Admitting: Urology

## 2018-01-18 VITALS — BP 178/86 | HR 59 | Ht 72.0 in | Wt 193.0 lb

## 2018-01-18 DIAGNOSIS — Z87442 Personal history of urinary calculi: Secondary | ICD-10-CM | POA: Insufficient documentation

## 2018-01-18 DIAGNOSIS — C679 Malignant neoplasm of bladder, unspecified: Secondary | ICD-10-CM

## 2018-01-18 DIAGNOSIS — N200 Calculus of kidney: Secondary | ICD-10-CM

## 2018-01-18 DIAGNOSIS — K8020 Calculus of gallbladder without cholecystitis without obstruction: Secondary | ICD-10-CM | POA: Insufficient documentation

## 2018-01-18 DIAGNOSIS — N400 Enlarged prostate without lower urinary tract symptoms: Secondary | ICD-10-CM

## 2018-01-18 LAB — URINALYSIS, COMPLETE
Bilirubin, UA: NEGATIVE
Glucose, UA: NEGATIVE
Ketones, UA: NEGATIVE
Leukocytes, UA: NEGATIVE
Nitrite, UA: NEGATIVE
Specific Gravity, UA: 1.025 (ref 1.005–1.030)
Urobilinogen, Ur: 0.2 mg/dL (ref 0.2–1.0)
pH, UA: 5.5 (ref 5.0–7.5)

## 2018-01-18 LAB — MICROSCOPIC EXAMINATION
RBC, UA: >30 /hpf — ABNORMAL HIGH (ref 0–2)
WBC, UA: NONE SEEN /hpf (ref 0–5)

## 2018-01-18 MED ORDER — LIDOCAINE HCL URETHRAL/MUCOSAL 2 % EX GEL
1.0000 "application " | Freq: Once | CUTANEOUS | Status: AC
  Administered 2018-01-18: 1 via URETHRAL

## 2018-01-18 MED ORDER — CIPROFLOXACIN HCL 500 MG PO TABS
500.0000 mg | ORAL_TABLET | Freq: Once | ORAL | Status: AC
  Administered 2018-01-18: 500 mg via ORAL

## 2018-01-18 NOTE — H&P (View-Only) (Signed)
10:03 AM  01/18/18  Cory Hardin [birth year] [id number]  Referring provider: Albina Billet, MD [address]   [place], [place] [address]   HPI: 72 yo M w/ recurrent nephrolithiasis, bladder stones, recurrent bladder CA, BPH s/p TURP, history of elevated PSA who returns for routine follow up/ cystoscopy.  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  He is followed by Dr. Lucky Cowboy.  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 He has a recurrence and undenwent TURBT in 08/2014, pathology LgTA.  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  S/p BCG x 6 completed 02/2015.  Cysto 06/2015 suspicious for recurrent, TURBT c/w granulomatous inflammation/ chronic cystitis.     He did have atypical cells in his cytology and have been alternating between normal and atypical on previous occasions.  Most recent cytology 07/2017 was negative as well as unremarkable cystoscopy.  He denies any gross hematuria or urinary symptoms.  Returns today for routine cystoscopy, cytology.  Nephrolithiasis: He has an extensive history of recurrent stones. He is undergone multiple stone procedures in the past. 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   Most recent intervention s/p R URS, LL on 06/23/15 s/p URS.   Follow-up renal ultrasound from 09/2015 shows no hydronephrosis. Bilateral nonobstructing stones present.Marland Kitchen KUB 01/2016  with 1.6 cm LLP stone, increased from 1.3 6 months ago.  Right small stone stable.  KUB 07/2016 stable. New gall stones appreciated.  KUB 01/2017 unchanged.    Due for KUB today- no recent stone episodes.  BPH: History of BPH, bladder stones S/p TURP 04/2014.  Voiding well.  No urinary complaints today.  Elevated PSA: PSA elevated on 10/10/15 to 4.3,  3.3 7/17, and 2.9 on 10/217.  DRE 7/17 unremarkable.   Elected discontinuation of PSA screening.   PMH: Past Medical History:  Diagnosis Date  . Bilateral kidney stones   . BPH (benign prostatic hyperplasia)   . Cancer ([place])    bladder  . Cancer of lateral wall of urinary bladder ([place]) 12/05/2014  . Chronic kidney disease    stones,renal failure  . Diabetes mellitus without complication ([place])   . DVT (deep venous thrombosis) ([place])   . GERD (gastroesophageal reflux disease)   . Hematuria   . Hyperlipidemia   . Hypertension     Surgical History: Past Surgical History:  Procedure Laterality Date  . APPENDECTOMY    . bladder stones    . CYSTOSCOPY    . CYSTOSCOPY WITH BIOPSY N/A 11/20/2014   Procedure: CYSTOSCOPY WITH BIOPSY;  Surgeon: [name], MD;  Location: [facility];  Service: Urology;  Laterality: N/A;  . CYSTOSCOPY WITH STENT PLACEMENT Right 06/23/2015   Procedure: CYSTOSCOPY WITH STENT PLACEMENT;  Surgeon: [name], MD;  Location: [facility];  Service: Urology;  Laterality: Right;  . IVC FILTER INSERTION N/A 08/11/2016   Procedure: IVC Filter Insertion;  Surgeon: Algernon Huxley, MD;  Location: [facility];  Service: Cardiovascular;  Laterality: N/A;  . IVC FILTER REMOVAL N/A 01/10/2017   Procedure: IVC Filter Removal;  Surgeon: Algernon Huxley, MD;  Location: [facility];  Service: Cardiovascular;  Laterality: N/A;  . PERCUTANEOUS NEPHROLITHOTRIPSY    . PULMONARY THROMBECTOMY N/A 08/11/2016   Procedure: Pulmonary Thrombectomy;  Surgeon: Algernon Huxley, MD;  Location: [facility];  Service: Cardiovascular;  Laterality: N/A;  . TRANSURETHRAL RESECTION OF BLADDER TUMOR N/A 06/23/2015   Procedure: TRANSURETHRAL RESECTION OF BLADDER TUMOR (TURBT);  Surgeon: [name], MD;  Location: [facility];  Service: Urology;  Laterality: N/A;  . TRANSURETHRAL RESECTION OF BLADDER TUMOR WITH GYRUS (TURBT-GYRUS)    . URETEROSCOPY WITH HOLMIUM LASER LITHOTRIPSY Right 06/23/2015   Procedure: URETEROSCOPY WITH HOLMIUM LASER  LITHOTRIPSY;  Surgeon: [name], MD;  Location: [facility];  Service: Urology;  Laterality: Right;    Home Medications:  Allergies as of 01/18/2018   No Known Allergies     Medication List        Accurate as of 01/18/18 10:03 AM. Always use your most recent med list.          ACCU-CHEK AVIVA PLUS test strip Generic drug:  glucose blood   apixaban 5 MG Tabs tablet Commonly known as:  ELIQUIS 10 mg po bid for 6 days, then 5 mg po bid.   carvedilol 12.5 MG tablet Commonly known as:  COREG Take 12.5 mg by mouth 2 (two) times daily with a meal.   fenofibrate 145 MG tablet Commonly known as:  TRICOR Take 145 mg by mouth daily.   glimepiride 1 MG tablet Commonly known as:  AMARYL Take 1 mg by mouth daily.   hydrochlorothiazide 25 MG tablet Commonly known as:  HYDRODIURIL Take 1 tablet (25 mg total) by mouth daily.   omeprazole 20 MG capsule Commonly known as:  PRILOSEC Take 20 mg by mouth daily.   pravastatin 40 MG tablet Commonly known as:  PRAVACHOL Take 40 mg by mouth daily. pm       Allergies: No Known Allergies  Family History: Family History  Problem Relation Age of Onset  . Heart disease Father   . Heart disease Mother   . Heart failure Mother   . COPD Mother   . Cancer Maternal Aunt   . Heart disease Brother   . Diabetes Brother   . Kidney disease Neg Hx   . Prostate cancer Neg Hx     Social History:  reports that he quit smoking about 35 years ago. His smoking use included cigarettes. He has a 3.75 pack-year smoking history. He has never used smokeless tobacco. He reports that he drinks about 0.6 oz of alcohol per week. He reports that he does not use drugs.  Physical Exam: BP (!) 178/86   Pulse (!) 59   Ht 6' (1.829 m)   Wt 193 lb (87.5 kg)   BMI 26.18 kg/m   Constitutional:  Alert and oriented, No acute distress. HEENT: [place] AT, moist mucus membranes.  Trachea midline, no masses. Cardiovascular: No clubbing, cyanosis, or edema.    Respiratory: Normal respiratory effort, no increased work of breathing.   GU: No CVA tenderness.  Normal circumcised phallus. Skin: No rashes, bruises or suspicious lesions. Neurologic: Grossly intact, no focal deficits, moving all 4 extremities. Psychiatric: Normal mood and affect.  Laboratory Data: Lab Results  Component Value Date   WBC 9.1 08/13/2016   HGB 12.6 (L) 08/13/2016   HCT 36.1 (L) 08/13/2016   MCV 90.9 08/13/2016   PLT 287 08/13/2016    Lab Results  Component Value Date   CREATININE 1.15 08/12/2016    Component     Latest Ref Rng & Units 10/10/2015 01/16/2016 04/19/2016  PSA     0.0 - 4.0 ng/mL 4.3 (H) 3.3 2.9   Urinalysis Reviewed, no evidence of infection  Cystoscopy Procedure Note  Patient identification was confirmed, informed consent was obtained, and patient was prepped using Betadine solution. Lidocaine jelly was administered per urethral meatus.   Preoperative abx where received prior to procedure.   Pre-Procedure: - Inspection reveals a normal caliber ureteral meatus.  Procedure: The flexible cystoscope was introduced without difficulty - No urethral strictures/lesions are present. - TURP defect noted of prostate, widely patent - Normal bladder neck - Bilateral ureteral orifices identified - Several stellate scar like lesions including on the posterior wall and right lateral wall.  There is a small heaped up nodule possibly with some papillary change, approximately 1 cm adjacent to the right ureteral orifice.  There is also a patch of erythema adjacent to this which is somewhat suspicious. - No bladder stones - No trabeculation  Retroflexion unremarkable.   Post-Procedure: - Patient tolerated the procedure well   Assessment & Plan:    1.History of bladder cancer/ bladder lesion Small nodular area in the bladder with adjacent erythema which is concerning for possible recurrence In addition to this, he has been having  occasional atypical urine cytologies which is somewhat concerning as well I recommended returning to the operating room for cystoscopy, bladder biopsy, bilateral retrograde pyelogram.  We discussed the risk and benefits of this in detail and is had the procedure several times and is familiar with this.  All questions answered.  We will need to get permission from Dr. Lucky Cowboy to hold his anticoagulation as possible.  If not, we can perform the procedure on anticoagulation, albeit the patient understands that he be much higher risk for hematuria.  2. Calcium nephrolithiasis Status post multiple procedures including ureteroscopy 2, ESWL, and remote history of PCNL and most recent right URS, LL, stent on 06/2015.   Bilateral nephrolithiasis on RUS/ KUB today essentially stable over the past year Call if develops flank pain sooner HCTZ 25mg  continue, consider referral to [facility] (Dr. Eulas Post Preminger) for further evaluation of active metabolic stone formation KUB today- will call with results  3. BPH (benign prostatic hyperplasia) s/p TURP on 04/17/14. Pathology negative. Voiding well   [facility] [address] [place]  [place],  [address] [phone]

## 2018-01-18 NOTE — Progress Notes (Signed)
10:03 AM  01/18/18  Jibri Schriefer Prows Jun 06, 1946 [id number]  Referring provider: Albina Billet, MD [address]   [place], [place] [address]   HPI: 72 yo M w/ recurrent nephrolithiasis, bladder stones, recurrent bladder CA, BPH s/p TURP, history of elevated PSA who returns for routine follow up/ cystoscopy.  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  He is followed by Dr. Lucky Cowboy.  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 He has a recurrence and undenwent TURBT in 08/2014, pathology LgTA.  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  S/p BCG x 6 completed 02/2015.  Cysto 06/2015 suspicious for recurrent, TURBT c/w granulomatous inflammation/ chronic cystitis.     He did have atypical cells in his cytology and have been alternating between normal and atypical on previous occasions.  Most recent cytology 07/2017 was negative as well as unremarkable cystoscopy.  He denies any gross hematuria or urinary symptoms.  Returns today for routine cystoscopy, cytology.  Nephrolithiasis: He has an extensive history of recurrent stones. He is undergone multiple stone procedures in the past. 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   Most recent intervention s/p R URS, LL on 06/23/15 s/p URS.   Follow-up renal ultrasound from 09/2015 shows no hydronephrosis. Bilateral nonobstructing stones present.Marland Kitchen KUB 01/2016  with 1.6 cm LLP stone, increased from 1.3 6 months ago.  Right small stone stable.  KUB 07/2016 stable. New gall stones appreciated.  KUB 01/2017 unchanged.    Due for KUB today- no recent stone episodes.  BPH: History of BPH, bladder stones S/p TURP 04/2014.  Voiding well.  No urinary complaints today.  Elevated PSA: PSA elevated on 10/10/15 to 4.3,  3.3 7/17, and 2.9 on 10/217.  DRE 7/17 unremarkable.   Elected discontinuation of PSA screening.   PMH: Past Medical History:  Diagnosis Date  . Bilateral kidney stones   . BPH (benign prostatic hyperplasia)   . Cancer ([place])    bladder  . Cancer of lateral wall of urinary bladder ([place]) 12/05/2014  . Chronic kidney disease    stones,renal failure  . Diabetes mellitus without complication ([place])   . DVT (deep venous thrombosis) ([place])   . GERD (gastroesophageal reflux disease)   . Hematuria   . Hyperlipidemia   . Hypertension     Surgical History: Past Surgical History:  Procedure Laterality Date  . APPENDECTOMY    . bladder stones    . CYSTOSCOPY    . CYSTOSCOPY WITH BIOPSY N/A 11/20/2014   Procedure: CYSTOSCOPY WITH BIOPSY;  Surgeon: [name], MD;  Location: [facility];  Service: Urology;  Laterality: N/A;  . CYSTOSCOPY WITH STENT PLACEMENT Right 06/23/2015   Procedure: CYSTOSCOPY WITH STENT PLACEMENT;  Surgeon: [name], MD;  Location: [facility];  Service: Urology;  Laterality: Right;  . IVC FILTER INSERTION N/A 08/11/2016   Procedure: IVC Filter Insertion;  Surgeon: Algernon Huxley, MD;  Location: [place] [facility];  Service: Cardiovascular;  Laterality: N/A;  . IVC FILTER REMOVAL N/A 01/10/2017   Procedure: IVC Filter Removal;  Surgeon: Algernon Huxley, MD;  Location: [place] [facility];  Service: Cardiovascular;  Laterality: N/A;  . PERCUTANEOUS NEPHROLITHOTRIPSY    . PULMONARY THROMBECTOMY N/A 08/11/2016   Procedure: Pulmonary Thrombectomy;  Surgeon: Algernon Huxley, MD;  Location: [place]  [facility];  Service: Cardiovascular;  Laterality: N/A;  . TRANSURETHRAL RESECTION OF BLADDER TUMOR N/A 06/23/2015   Procedure: TRANSURETHRAL RESECTION OF BLADDER TUMOR (TURBT);  Surgeon: [name], MD;  Location: [facility];  Service: Urology;  Laterality: N/A;  . TRANSURETHRAL RESECTION OF BLADDER TUMOR WITH GYRUS (TURBT-GYRUS)    . URETEROSCOPY WITH HOLMIUM LASER LITHOTRIPSY Right 06/23/2015   Procedure: URETEROSCOPY WITH HOLMIUM LASER  LITHOTRIPSY;  Surgeon: [name], MD;  Location: [facility];  Service: Urology;  Laterality: Right;    Home Medications:  Allergies as of 01/18/2018   No Known Allergies     Medication List        Accurate as of 01/18/18 10:03 AM. Always use your most recent med list.          ACCU-CHEK AVIVA PLUS test strip Generic drug:  glucose blood   apixaban 5 MG Tabs tablet Commonly known as:  ELIQUIS 10 mg po bid for 6 days, then 5 mg po bid.   carvedilol 12.5 MG tablet Commonly known as:  COREG Take 12.5 mg by mouth 2 (two) times daily with a meal.   fenofibrate 145 MG tablet Commonly known as:  TRICOR Take 145 mg by mouth daily.   glimepiride 1 MG tablet Commonly known as:  AMARYL Take 1 mg by mouth daily.   hydrochlorothiazide 25 MG tablet Commonly known as:  HYDRODIURIL Take 1 tablet (25 mg total) by mouth daily.   omeprazole 20 MG capsule Commonly known as:  PRILOSEC Take 20 mg by mouth daily.   pravastatin 40 MG tablet Commonly known as:  PRAVACHOL Take 40 mg by mouth daily. pm       Allergies: No Known Allergies  Family History: Family History  Problem Relation Age of Onset  . Heart disease Father   . Heart disease Mother   . Heart failure Mother   . COPD Mother   . Cancer Maternal Aunt   . Heart disease Brother   . Diabetes Brother   . Kidney disease Neg Hx   . Prostate cancer Neg Hx     Social History:  reports that he quit smoking about 35 years ago. His smoking use included cigarettes. He has a 3.75 pack-year smoking history. He has never used smokeless tobacco. He reports that he drinks about 0.6 oz of alcohol per week. He reports that he does not use drugs.  Physical Exam: BP (!) 178/86   Pulse (!) 59   Ht 6' (1.829 m)   Wt 193 lb (87.5 kg)   BMI 26.18 kg/m   Constitutional:  Alert and oriented, No acute distress. HEENT: [place] AT, moist mucus membranes.  Trachea midline, no masses. Cardiovascular: No clubbing, cyanosis, or edema.    Respiratory: Normal respiratory effort, no increased work of breathing.   GU: No CVA tenderness.  Normal circumcised phallus. Skin: No rashes, bruises or suspicious lesions. Neurologic: Grossly intact, no focal deficits, moving all 4 extremities. Psychiatric: Normal mood and affect.  Laboratory Data: Lab Results  Component Value Date   WBC 9.1 08/13/2016   HGB 12.6 (L) 08/13/2016   HCT 36.1 (L) 08/13/2016   MCV 90.9 08/13/2016   PLT 287 08/13/2016    Lab Results  Component Value Date   CREATININE 1.15 08/12/2016    Component     Latest Ref Rng & Units 10/10/2015 01/16/2016 04/19/2016  PSA     0.0 - 4.0 ng/mL 4.3 (H) 3.3 2.9   Urinalysis Reviewed, no evidence of infection  Cystoscopy Procedure Note  Patient identification was confirmed, informed consent was obtained, and patient was prepped using Betadine solution. Lidocaine jelly was administered per urethral meatus.   Preoperative abx where received prior to procedure.   Pre-Procedure: - Inspection reveals a normal caliber ureteral meatus.  Procedure: The flexible cystoscope was introduced without difficulty - No urethral strictures/lesions are present. - TURP defect noted of prostate, widely patent - Normal bladder neck - Bilateral ureteral orifices identified - Several stellate scar like lesions including on the posterior wall and right lateral wall.  There is a small heaped up nodule possibly with some papillary change, approximately 1 cm adjacent to the right ureteral orifice.  There is also a patch of erythema adjacent to this which is somewhat suspicious. - No bladder stones - No trabeculation  Retroflexion unremarkable.   Post-Procedure: - Patient tolerated the procedure well   Assessment & Plan:    1.History of bladder cancer/ bladder lesion Small nodular area in the bladder with adjacent erythema which is concerning for possible recurrence In addition to this, he has been having  occasional atypical urine cytologies which is somewhat concerning as well I recommended returning to the operating room for cystoscopy, bladder biopsy, bilateral retrograde pyelogram.  We discussed the risk and benefits of this in detail and is had the procedure several times and is familiar with this.  All questions answered.  We will need to get permission from Dr. Lucky Cowboy to hold his anticoagulation as possible.  If not, we can perform the procedure on anticoagulation, albeit the patient understands that he be much higher risk for hematuria.  2. Calcium nephrolithiasis Status post multiple procedures including ureteroscopy 2, ESWL, and remote history of PCNL and most recent right URS, LL, stent on 06/2015.   Bilateral nephrolithiasis on RUS/ KUB today essentially stable over the past year Call if develops flank pain sooner HCTZ 25mg  continue, consider referral to [facility] (Dr. Eulas Post Preminger) for further evaluation of active metabolic stone formation KUB today- will call with results  3. BPH (benign prostatic hyperplasia) s/p TURP on 04/17/14. Pathology negative. Voiding well   [facility] [address] [place]  [place], [place] [address] [phone]

## 2018-01-20 ENCOUNTER — Other Ambulatory Visit: Payer: Self-pay | Admitting: Radiology

## 2018-01-20 ENCOUNTER — Telehealth: Payer: Self-pay | Admitting: Radiology

## 2018-01-20 LAB — CULTURE, URINE COMPREHENSIVE

## 2018-01-20 NOTE — Telephone Encounter (Signed)
Consent updated. Notified patient of Dr Cherrie Gauze note below & advised patient to hold Eliquis beginning 01/28/2018 per clearance obtained from Dr Lucky Cowboy. No questions at this time. Patient voices understanding.

## 2018-01-20 NOTE — Telephone Encounter (Signed)
-----   Message from [name], MD sent at 01/19/2018  3:01 PM EDT ----- Please let Mr. Landowski know that his x-ray looks stable in terms of his kidney stones.  That being said, it looks like there is a new calcification possibly within his right distal ureter.  Since he is going to the operating room, we will treat retrogrades and if he see the stone, I would like to add ureteroscopy with laser lithotripsy onto the consent form.  I will discuss this further with him in the preop area.  Please add this to the booking sheet as well.  [name], MD

## 2018-01-23 ENCOUNTER — Encounter
Admission: RE | Admit: 2018-01-23 | Discharge: 2018-01-23 | Disposition: A | Discharge status: Home / Self Care | Payer: Medicare Other | Source: Ambulatory Visit | Attending: Urology | Admitting: Urology

## 2018-01-23 ENCOUNTER — Other Ambulatory Visit: Payer: Self-pay

## 2018-01-23 DIAGNOSIS — R9431 Abnormal electrocardiogram [ECG] [EKG]: Secondary | ICD-10-CM | POA: Diagnosis not present

## 2018-01-23 DIAGNOSIS — Z01812 Encounter for preprocedural laboratory examination: Secondary | ICD-10-CM | POA: Diagnosis present

## 2018-01-23 DIAGNOSIS — R001 Bradycardia, unspecified: Secondary | ICD-10-CM | POA: Insufficient documentation

## 2018-01-23 DIAGNOSIS — Z01810 Encounter for preprocedural cardiovascular examination: Secondary | ICD-10-CM | POA: Insufficient documentation

## 2018-01-23 DIAGNOSIS — I10 Essential (primary) hypertension: Secondary | ICD-10-CM | POA: Diagnosis not present

## 2018-01-23 HISTORY — DX: Anxiety disorder, unspecified: F41.9

## 2018-01-23 HISTORY — DX: Unspecified asthma, uncomplicated: J45.909

## 2018-01-23 LAB — BASIC METABOLIC PANEL
Anion gap: 8 (ref 5–15)
BUN: 22 mg/dL (ref 8–23)
CO2: 30 mmol/L (ref 22–32)
Calcium: 9.9 mg/dL (ref 8.9–10.3)
Chloride: 105 mmol/L (ref 98–111)
Creatinine, Ser: 1.46 mg/dL — ABNORMAL HIGH (ref 0.61–1.24)
GFR calc Af Amer: 54 mL/min — ABNORMAL LOW (ref >60)
GFR calc non Af Amer: 46 mL/min — ABNORMAL LOW (ref >60)
Glucose, Bld: 191 mg/dL — ABNORMAL HIGH (ref 70–99)
Potassium: 4.1 mmol/L (ref 3.5–5.1)
Sodium: 143 mmol/L (ref 135–145)

## 2018-01-23 NOTE — Patient Instructions (Signed)
Your procedure is scheduled on: Monday 01/30/18 Report to [facility]. To find out your arrival time please call [phone] between 1PM - 3PM on Friday 01/27/18.  Remember: Instructions that are not followed completely may result in serious medical risk, up to and including death, or upon the discretion of your surgeon and anesthesiologist your surgery may need to be rescheduled.     _X__ 1. Do not eat food after midnight the night before your procedure.                 No gum chewing or hard candies. You may drink clear liquids up to 2 hours                 before you are scheduled to arrive for your surgery- DO not drink clear                 liquids within 2 hours of the start of your surgery.                 Clear Liquids include:  water, apple juice without pulp, clear carbohydrate                 drink such as Clearfast or Gatorade, Black Coffee or Tea (Do not add                 anything to coffee or tea).  __X__2.  On the morning of surgery brush your teeth with toothpaste and water, you                 may rinse your mouth with mouthwash if you wish.  Do not swallow any              toothpaste of mouthwash.     _X__ 3.  No Alcohol for 24 hours before or after surgery.   _X__ 4.  Do Not Smoke or use e-cigarettes For 24 Hours Prior to Your Surgery.                 Do not use any chewable tobacco products for at least 6 hours prior to                 surgery.  ____  5.  Bring all medications with you on the day of surgery if instructed.   __X__  6.  Notify your doctor if there is any change in your medical condition      (cold, fever, infections).     Do not wear jewelry, make-up, hairpins, clips or nail polish. Do not wear lotions, powders, or perfumes.  Do not shave 48 hours prior to surgery. Men may shave face and neck. Do not bring valuables to the hospital.    [facility] is not responsible for any belongings or  valuables.  Contacts, dentures/partials or body piercings may not be worn into surgery. Bring a case for your contacts, glasses or hearing aids, a denture cup will be supplied. Leave your suitcase in the car. After surgery it may be brought to your room. For patients admitted to the hospital, discharge time is determined by your treatment team.   Patients discharged the day of surgery will not be allowed to drive home.   Please read over the following fact sheets that you were given:   MRSA Information  __X__ Take these medicines the morning of surgery with A SIP OF WATER:  1. carvedilol (COREG) 12.5 MG tablet  2. fenofibrate (TRICOR) 145 MG tablet  3. omeprazole (PRILOSEC) 20 MG capsule  4.  5.  6.  ____ Fleet Enema (as directed)   __X__ Use CHG Soap/SAGE wipes as directed  ____ Use inhalers on the day of surgery  ____ Stop metformin/Janumet/Farxiga 2 days prior to surgery    ____ Take 1/2 of usual insulin dose the night before surgery. No insulin the morning          of surgery.   __X__ Stop Blood Thinners Coumadin/Plavix/Xarelto/Pleta/Pradaxa/Eliquis/Effient/Aspirin Stop taking Eliquis 3 days prior to your surgery as directed by Dr. Erlene Quan.    __X__ Stop Anti-inflammatories 7 days before surgery such as Advil, Ibuprofen, Motrin,  BC or Goodies Powder, Naprosyn, Naproxen, Aleve, Aspirin. You may use Tylenol for pain if needed.   __X__ Lubertha Basque herbal supplements, fish oil or vitamin E until after surgery.    ____ Bring C-Pap to the hospital.

## 2018-01-29 MED ORDER — CEFAZOLIN SODIUM-DEXTROSE 2-4 GM/100ML-% IV SOLN
2.0000 g | INTRAVENOUS | Status: AC
  Administered 2018-01-30: 2 g via INTRAVENOUS

## 2018-01-30 ENCOUNTER — Encounter
Admission: RE | Disposition: A | Discharge status: Home / Self Care | Payer: Self-pay | Source: Ambulatory Visit | Attending: Urology

## 2018-01-30 ENCOUNTER — Encounter: Payer: Self-pay | Admitting: Anesthesiology

## 2018-01-30 ENCOUNTER — Ambulatory Visit: Payer: Medicare Other | Admitting: Anesthesiology

## 2018-01-30 ENCOUNTER — Other Ambulatory Visit: Payer: Self-pay

## 2018-01-30 ENCOUNTER — Ambulatory Visit
Admission: RE | Admit: 2018-01-30 | Discharge: 2018-01-30 | Disposition: A | Discharge status: Home / Self Care | Payer: Medicare Other | Source: Ambulatory Visit | Attending: Urology | Admitting: Urology

## 2018-01-30 DIAGNOSIS — N201 Calculus of ureter: Secondary | ICD-10-CM | POA: Diagnosis not present

## 2018-01-30 DIAGNOSIS — N200 Calculus of kidney: Secondary | ICD-10-CM | POA: Diagnosis not present

## 2018-01-30 DIAGNOSIS — Z79899 Other long term (current) drug therapy: Secondary | ICD-10-CM | POA: Diagnosis not present

## 2018-01-30 DIAGNOSIS — Z86718 Personal history of other venous thrombosis and embolism: Secondary | ICD-10-CM | POA: Insufficient documentation

## 2018-01-30 DIAGNOSIS — Z7901 Long term (current) use of anticoagulants: Secondary | ICD-10-CM | POA: Insufficient documentation

## 2018-01-30 DIAGNOSIS — I739 Peripheral vascular disease, unspecified: Secondary | ICD-10-CM | POA: Insufficient documentation

## 2018-01-30 DIAGNOSIS — N189 Chronic kidney disease, unspecified: Secondary | ICD-10-CM | POA: Insufficient documentation

## 2018-01-30 DIAGNOSIS — Z8249 Family history of ischemic heart disease and other diseases of the circulatory system: Secondary | ICD-10-CM | POA: Diagnosis not present

## 2018-01-30 DIAGNOSIS — C679 Malignant neoplasm of bladder, unspecified: Secondary | ICD-10-CM | POA: Diagnosis present

## 2018-01-30 DIAGNOSIS — Z85528 Personal history of other malignant neoplasm of kidney: Secondary | ICD-10-CM | POA: Diagnosis not present

## 2018-01-30 DIAGNOSIS — K219 Gastro-esophageal reflux disease without esophagitis: Secondary | ICD-10-CM | POA: Insufficient documentation

## 2018-01-30 DIAGNOSIS — E1122 Type 2 diabetes mellitus with diabetic chronic kidney disease: Secondary | ICD-10-CM | POA: Insufficient documentation

## 2018-01-30 DIAGNOSIS — Z7984 Long term (current) use of oral hypoglycemic drugs: Secondary | ICD-10-CM | POA: Diagnosis not present

## 2018-01-30 DIAGNOSIS — Z87442 Personal history of urinary calculi: Secondary | ICD-10-CM | POA: Diagnosis not present

## 2018-01-30 DIAGNOSIS — E785 Hyperlipidemia, unspecified: Secondary | ICD-10-CM | POA: Diagnosis not present

## 2018-01-30 DIAGNOSIS — Z87891 Personal history of nicotine dependence: Secondary | ICD-10-CM | POA: Diagnosis not present

## 2018-01-30 DIAGNOSIS — D494 Neoplasm of unspecified behavior of bladder: Secondary | ICD-10-CM | POA: Diagnosis not present

## 2018-01-30 DIAGNOSIS — N3289 Other specified disorders of bladder: Secondary | ICD-10-CM | POA: Diagnosis not present

## 2018-01-30 DIAGNOSIS — I129 Hypertensive chronic kidney disease with stage 1 through stage 4 chronic kidney disease, or unspecified chronic kidney disease: Secondary | ICD-10-CM | POA: Insufficient documentation

## 2018-01-30 HISTORY — PX: CYSTOSCOPY WITH BIOPSY: SHX5122

## 2018-01-30 HISTORY — PX: CYSTOSCOPY/URETEROSCOPY/HOLMIUM LASER/STENT PLACEMENT: SHX6546

## 2018-01-30 HISTORY — PX: CYSTOSCOPY W/ RETROGRADES: SHX1426

## 2018-01-30 LAB — GLUCOSE, CAPILLARY
Glucose-Capillary: 144 mg/dL — ABNORMAL HIGH (ref 70–99)
Glucose-Capillary: 136 mg/dL — ABNORMAL HIGH (ref 70–99)

## 2018-01-30 SURGERY — CYSTOSCOPY, WITH BIOPSY
Anesthesia: General | Site: Ureter | Laterality: Right | Wound class: "Clean Contaminated "

## 2018-01-30 MED ORDER — DEXAMETHASONE SODIUM PHOSPHATE 10 MG/ML IJ SOLN
INTRAMUSCULAR | Status: AC
  Filled 2018-01-30: qty 1

## 2018-01-30 MED ORDER — IOTHALAMATE MEGLUMINE 43 % IV SOLN
INTRAVENOUS | Status: DC | PRN
  Administered 2018-01-30: 15 mL via URETHRAL

## 2018-01-30 MED ORDER — CEFAZOLIN SODIUM-DEXTROSE 2-4 GM/100ML-% IV SOLN
INTRAVENOUS | Status: AC
  Filled 2018-01-30: qty 100

## 2018-01-30 MED ORDER — PROPOFOL 10 MG/ML IV BOLUS
INTRAVENOUS | Status: DC | PRN
  Administered 2018-01-30: 200 mg via INTRAVENOUS

## 2018-01-30 MED ORDER — OXYBUTYNIN CHLORIDE 5 MG PO TABS: 5.0000 mg | ORAL_TABLET | Freq: Three times a day (TID) | ORAL | 0 refills | Status: DC | PRN

## 2018-01-30 MED ORDER — DOCUSATE SODIUM 100 MG PO CAPS: 100.0000 mg | ORAL_CAPSULE | Freq: Two times a day (BID) | ORAL | 0 refills | Status: DC

## 2018-01-30 MED ORDER — DEXMEDETOMIDINE HCL 200 MCG/2ML IV SOLN
INTRAVENOUS | Status: DC | PRN
  Administered 2018-01-30: 8 ug via INTRAVENOUS

## 2018-01-30 MED ORDER — PROPOFOL 500 MG/50ML IV EMUL
INTRAVENOUS | Status: AC
  Filled 2018-01-30: qty 50

## 2018-01-30 MED ORDER — FENTANYL CITRATE (PF) 100 MCG/2ML IJ SOLN
INTRAMUSCULAR | Status: DC | PRN
  Administered 2018-01-30 (×2): 50 ug via INTRAVENOUS

## 2018-01-30 MED ORDER — GLYCOPYRROLATE 0.2 MG/ML IJ SOLN
INTRAMUSCULAR | Status: DC | PRN
Start: 2018-01-30 — End: 2018-01-30
  Administered 2018-01-30: 0.2 mg via INTRAVENOUS

## 2018-01-30 MED ORDER — ONDANSETRON HCL 4 MG/2ML IJ SOLN
INTRAMUSCULAR | Status: DC | PRN
  Administered 2018-01-30: 4 mg via INTRAVENOUS

## 2018-01-30 MED ORDER — FENTANYL CITRATE (PF) 100 MCG/2ML IJ SOLN
INTRAMUSCULAR | Status: AC
  Filled 2018-01-30: qty 2

## 2018-01-30 MED ORDER — LIDOCAINE HCL (PF) 2 % IJ SOLN
INTRAMUSCULAR | Status: AC
  Filled 2018-01-30: qty 10

## 2018-01-30 MED ORDER — DEXAMETHASONE SODIUM PHOSPHATE 10 MG/ML IJ SOLN
INTRAMUSCULAR | Status: DC | PRN
  Administered 2018-01-30: 10 mg via INTRAVENOUS

## 2018-01-30 MED ORDER — HYDROCODONE-ACETAMINOPHEN 5-325 MG PO TABS: 1.0000 | ORAL_TABLET | Freq: Four times a day (QID) | ORAL | 0 refills | Status: DC | PRN

## 2018-01-30 MED ORDER — SODIUM CHLORIDE 0.9 % IV SOLN
INTRAVENOUS | Status: DC
  Administered 2018-01-30: 09:00:00 via INTRAVENOUS

## 2018-01-30 MED ORDER — LIDOCAINE 2% (20 MG/ML) 5 ML SYRINGE
INTRAMUSCULAR | Status: DC | PRN
  Administered 2018-01-30: 100 mg via INTRAVENOUS

## 2018-01-30 MED ORDER — ONDANSETRON HCL 4 MG/2ML IJ SOLN
INTRAMUSCULAR | Status: AC
  Filled 2018-01-30: qty 2

## 2018-01-30 MED ORDER — FENTANYL CITRATE (PF) 100 MCG/2ML IJ SOLN: 25.0000 ug | INTRAMUSCULAR | Status: DC | PRN

## 2018-01-30 MED ORDER — ONDANSETRON HCL 4 MG/2ML IJ SOLN: 4.0000 mg | Freq: Once | INTRAMUSCULAR | Status: DC | PRN

## 2018-01-30 MED ORDER — TAMSULOSIN HCL 0.4 MG PO CAPS: 0.4000 mg | ORAL_CAPSULE | Freq: Every day | ORAL | 0 refills | Status: DC

## 2018-01-30 MED ORDER — GLYCOPYRROLATE 0.2 MG/ML IJ SOLN
INTRAMUSCULAR | Status: AC
  Filled 2018-01-30: qty 1

## 2018-01-30 SURGICAL SUPPLY — 39 items
BAG DRAIN CYSTO-URO LG1000N (MISCELLANEOUS) ×5 IMPLANT
BASKET ZERO TIP 1.9FR (BASKET) ×2 IMPLANT
BRUSH SCRUB EZ  4% CHG (MISCELLANEOUS) ×2
BRUSH SCRUB EZ 1% IODOPHOR (MISCELLANEOUS) ×5 IMPLANT
BRUSH SCRUB EZ 4% CHG (MISCELLANEOUS) ×3 IMPLANT
CATH URETL 5X70 OPEN END (CATHETERS) ×5 IMPLANT
CNTNR SPEC 2.5X3XGRAD LEK (MISCELLANEOUS) ×3
CONRAY 43 FOR UROLOGY 50M (MISCELLANEOUS) ×5 IMPLANT
CONT SPEC 4OZ STER OR WHT (MISCELLANEOUS) ×2
CONTAINER SPEC 2.5X3XGRAD LEK (MISCELLANEOUS) IMPLANT
DRAPE UTILITY 15X26 TOWEL STRL (DRAPES) ×5 IMPLANT
DRSG TEGADERM 2-3/8X2-3/4 SM (GAUZE/BANDAGES/DRESSINGS) ×2 IMPLANT
DRSG TELFA 4X3 1S NADH ST (GAUZE/BANDAGES/DRESSINGS) ×5 IMPLANT
ELECT REM PT RETURN 9FT ADLT (ELECTROSURGICAL) ×5
ELECTRODE REM PT RTRN 9FT ADLT (ELECTROSURGICAL) ×3 IMPLANT
FIBER LASER LITHO 273 (Laser) ×2 IMPLANT
GLOVE BIO SURGEON STRL SZ 6.5 (GLOVE) ×4 IMPLANT
GLOVE BIO SURGEONS STRL SZ 6.5 (GLOVE) ×1
GOWN STRL REUS W/ TWL LRG LVL3 (GOWN DISPOSABLE) ×6 IMPLANT
GOWN STRL REUS W/TWL LRG LVL3 (GOWN DISPOSABLE) ×4
GUIDEWIRE GREEN .038 145CM (MISCELLANEOUS) IMPLANT
INFUSOR MANOMETER BAG 3000ML (MISCELLANEOUS) ×5 IMPLANT
INTRODUCER DILATOR DOUBLE (INTRODUCER) IMPLANT
KIT TURNOVER CYSTO (KITS) ×5 IMPLANT
NDL SAFETY ECLIPSE 18X1.5 (NEEDLE) ×3 IMPLANT
NEEDLE HYPO 18GX1.5 SHARP (NEEDLE) ×2
PACK CYSTO AR (MISCELLANEOUS) ×5 IMPLANT
SENSORWIRE 0.038 NOT ANGLED (WIRE) ×5
SET CYSTO W/LG BORE CLAMP LF (SET/KITS/TRAYS/PACK) ×5 IMPLANT
SHEATH URETERAL 12FRX35CM (MISCELLANEOUS) IMPLANT
SLEEVE GASTRECTOMY 36FR VISIGI (MISCELLANEOUS) ×2 IMPLANT
SOL .9 NS 3000ML IRR  AL (IV SOLUTION) ×2
SOL .9 NS 3000ML IRR UROMATIC (IV SOLUTION) ×3 IMPLANT
STENT URET 6FRX24 CONTOUR (STENTS) ×2 IMPLANT
STENT URET 6FRX26 CONTOUR (STENTS) IMPLANT
SURGILUBE 2OZ TUBE FLIPTOP (MISCELLANEOUS) ×5 IMPLANT
WATER STERILE IRR 1000ML POUR (IV SOLUTION) ×5 IMPLANT
WATER STERILE IRR 3000ML UROMA (IV SOLUTION) ×5 IMPLANT
WIRE SENSOR 0.038 NOT ANGLED (WIRE) ×3 IMPLANT

## 2018-01-30 NOTE — Anesthesia Postprocedure Evaluation (Signed)
Anesthesia Post Note  Patient: Cory Hardin  Procedure(s) Performed: CYSTOSCOPY WITH Bladder BIOPSY (N/A Bladder) CYSTOSCOPY WITH RETROGRADE PYELOGRAM (Bilateral Ureter) CYSTOSCOPY/URETEROSCOPY/HOLMIUM LASER/STENT PLACEMENT (Right Ureter)  Patient location during evaluation: PACU Anesthesia Type: General Level of consciousness: awake and alert Pain management: pain level controlled Vital Signs Assessment: post-procedure vital signs reviewed and stable Respiratory status: spontaneous breathing, nonlabored ventilation, respiratory function stable and patient connected to nasal cannula oxygen Cardiovascular status: blood pressure returned to baseline and stable Postop Assessment: no apparent nausea or vomiting Anesthetic complications: no     Last Vitals:  Vitals:   01/30/18 1100 01/30/18 1115  BP: (!) 151/79 (!) 158/85  Pulse: (!) 55 (!) 56  Resp: 18 18  Temp: (!) 36.1 C   SpO2: 97% 99%    Last Pain:  Vitals:   01/30/18 1115  TempSrc: Temporal  PainSc: 0-No pain                 Victora Irby S

## 2018-01-30 NOTE — Interval H&P Note (Signed)
History and Physical Interval Note:  01/30/2018 8:44 AM  Cory Hardin  has presented today for surgery, with the diagnosis of bladder lesion, nephrolithiasis  The various methods of treatment have been discussed with the patient and family. After consideration of risks, benefits and other options for treatment, the patient has consented to  Procedure(s): CYSTOSCOPY WITH Bladder BIOPSY (N/A) CYSTOSCOPY WITH RETROGRADE PYELOGRAM (Bilateral) CYSTOSCOPY/URETEROSCOPY/HOLMIUM LASER/STENT PLACEMENT (Right) as a surgical intervention .  The patient's history has been reviewed, patient examined, no change in status, stable for surgery.  I have reviewed the patient's chart and labs.  Questions were answered to the patient's satisfaction.    Notably, KUB showed a calcific density within the right pelvis, possibly representing a distal ureteral stone.  As such, we will go ahead and proceed with a right ureteroscopy if his retrograde pyelogram is abnormal and treat the stone if present.  He is agreeable this plan.  RRR CTAB  [name]

## 2018-01-30 NOTE — Anesthesia Preprocedure Evaluation (Signed)
Anesthesia Evaluation  Patient identified by MRN, date of birth, ID band Patient awake    Reviewed: Allergy & Precautions, NPO status , Patient's Chart, lab work & pertinent test results, reviewed documented beta blocker date and time   Airway Mallampati: II  TM Distance: >3 FB     Dental  (+) Chipped   Pulmonary asthma , former smoker,           Cardiovascular hypertension, Pt. on medications and Pt. on home beta blockers + Peripheral Vascular Disease       Neuro/Psych Anxiety    GI/Hepatic GERD  Controlled,  Endo/Other  diabetes, Type 2  Renal/GU Renal disease     Musculoskeletal   Abdominal   Peds  Hematology   Anesthesia Other Findings PE.   Reproductive/Obstetrics                             Anesthesia Physical Anesthesia Plan  ASA: III  Anesthesia Plan: General   Post-op Pain Management:    Induction: Intravenous  PONV Risk Score and Plan:   Airway Management Planned: LMA  Additional Equipment:   Intra-op Plan:   Post-operative Plan:   Informed Consent: I have reviewed the patients History and Physical, chart, labs and discussed the procedure including the risks, benefits and alternatives for the proposed anesthesia with the patient or authorized representative who has indicated his/her understanding and acceptance.     Plan Discussed with: CRNA  Anesthesia Plan Comments:         Anesthesia Quick Evaluation

## 2018-01-30 NOTE — Op Note (Signed)
Date of procedure: 01/30/18  Preoperative diagnosis:  1. Right ureteral calculus 2. History of bladder cancer 3. Bladder lesion  Postoperative diagnosis:  1. Same as above 2. Small bladder tumor x2   Procedure: 1. Cystoscopy 2. Bilateral retrograde pyelogram 3. Right ureteroscopy with laser lithotripsy 4. Right ureteral stent placement 5. TURBT, small 6. Bladder biopsy with fulguration  Surgeon: [name], MD  Anesthesia: General  Complications: None  Intraoperative findings: 2 small papillary tumors, approximately 0.5 cm each, one adjacent to right UO and right lateral wall.  Nonspecific erythema adjacent to these areas.  Calcification within right distal ureter with filling defects.  Stone within right distal ureter treated and removed.  EBL: Minimal  Specimens: Bladder tumor x2, adjacent bladder erythema  Drains:  6 x 24 French double-J ureteral stent on right with tether left in place  Indication: Cory Hardin is a 72 y.o. patient with with a history of recurrent kidney stones and bladder cancer.  After reviewing the management options for treatment, he elected to proceed with the above surgical procedure(s). We have discussed the potential benefits and risks of the procedure, side effects of the proposed treatment, the likelihood of the patient achieving the goals of the procedure, and any potential problems that might occur during the procedure or recuperation. Informed consent has been obtained.  Description of procedure:  The patient was taken to the operating room and general anesthesia was induced.  The patient was placed in the dorsal lithotomy position, prepped and draped in the usual sterile fashion, and preoperative antibiotics were administered. A preoperative time-out was performed.   A 21 French scope was advanced per urethra into the bladder.  The bladder was carefully inspected and 2 small approximate 0.5 cm lesions were identified, one adjacent to the  right ureteral orifice and one on the right ureteral wall.  These were papillary in nature on a relatively broad base and had a very superficial appearance.  There is some nonspecific erythema adjacent to these areas but otherwise the bladder was unremarkable.  Attention was turned to the left ureteral orifice which was cannulated using a 5 [place] open-ended ureteral catheter.  Gentle retrograde pyelogram was performed on the side which revealed a decompressed collecting system without hydronephrosis or filling defects.  Attention was then turned to the right ureteral orifice.  On scout imaging, the stone in question could be seen presumably within the right distal ureter.  A retrograde pyelogram confirmed this with a bulbous-like defect at this level possibly result representing edema around the stone.  The ureter itself was dilated just proximal to this but there was no hydronephrosis.  A sensor wire was then placed up to level of the kidney and stepped in place as a safety wire.  A 4.5 French long semirigid ureteroscope was then advanced up to level the stone.  There was indeed edema surrounding the stone but no other identifiable pathology.  Due to the size of the stone, basket did not appear to be amenable without fragmentation.  As such, 273 m laser fiber was brought in and using the settings of 0.8 J and 10 Hz, the stone was fragmented into 6 or 7 smaller pieces.  A 1.9 French tipless nitinol basket was then used to extract each and every fragment.  Once the ureter was cleared, the scope was removed.  The wire remained in place.  Next, cold cup biopsies were used to resect the 2 tumors in a pea-sized fashion.  These were passed off  the field as bladder tumor.  Adjacent erythema was also biopsied and sent as a separate specimen.  Bugbee electrocautery was then used to fulgurate the bases of each of these lesions.  Finally, the safety wire was backloaded over rigid cystoscope.  A 6 x 24 French double-J  ureteral stent was advanced over the wire up to level the kidney.  The wires partially drawn until full coils noted within the renal pelvis.  The wires and fully withdrawn a full coils noted within the bladder.  The bladder was then drained.  Hemostasis was adequate.  The patient was clean and dried and the stent string was affixed to the glans using Tegaderm and Mastisol.  He was then repositioned in supine position, reversed from anesthesia, taken to PACU in stable condition.  Plan: I will call the patient with his pathology results as I suspect this will be superficial bladder cancer and needs surveillance only.  He will follow-up in 4 weeks with renal ultrasound prior.  He will remove his own stent on Friday.  [name], M.D.

## 2018-01-30 NOTE — Anesthesia Post-op Follow-up Note (Signed)
Anesthesia QCDR form completed.        

## 2018-01-30 NOTE — Anesthesia Procedure Notes (Signed)
Procedure Name: LMA Insertion Date/Time: 01/30/2018 9:36 AM Performed by: Marsh Dolly, CRNA Pre-anesthesia Checklist: Patient identified, Patient being monitored, Timeout performed, Emergency Drugs available and Suction available Patient Re-evaluated:Patient Re-evaluated prior to induction Oxygen Delivery Method: Circle system utilized Preoxygenation: Pre-oxygenation with 100% oxygen Induction Type: IV induction Ventilation: Mask ventilation without difficulty LMA: LMA inserted LMA Size: 5.0 Tube type: Oral Number of attempts: 1 Placement Confirmation: positive ETCO2 and breath sounds checked- equal and bilateral Tube secured with: Tape Dental Injury: Teeth and Oropharynx as per pre-operative assessment

## 2018-01-30 NOTE — Transfer of Care (Signed)
Immediate Anesthesia Transfer of Care Note  Patient: Cory Hardin  Procedure(s) Performed: CYSTOSCOPY WITH Bladder BIOPSY (N/A Bladder) CYSTOSCOPY WITH RETROGRADE PYELOGRAM (Bilateral Ureter) CYSTOSCOPY/URETEROSCOPY/HOLMIUM LASER/STENT PLACEMENT (Right Ureter)  Patient Location: PACU  Anesthesia Type:General  Level of Consciousness: awake, alert  and oriented  Airway & Oxygen Therapy: Patient Spontanous Breathing and Patient connected to face mask oxygen  Post-op Assessment: Report given to RN and Post -op Vital signs reviewed and stable  Post vital signs: Reviewed and stable  Last Vitals:  Vitals Value Taken Time  BP 121/80 01/30/2018 10:10 AM  Temp    Pulse 57 01/30/2018 10:11 AM  Resp 10 01/30/2018 10:11 AM  SpO2 95 % 01/30/2018 10:11 AM  Vitals shown include unvalidated device data.  Last Pain:  Vitals:   01/30/18 0821  TempSrc: Temporal  PainSc: 0-No pain         Complications: No apparent anesthesia complications

## 2018-01-30 NOTE — Discharge Instructions (Addendum)
You have a ureteral stent in place.  This is a tube that extends from your kidney to your bladder.  This may cause urinary bleeding, burning with urination, and urinary frequency.  Please call our office or present to the ED if you develop fevers >101 or pain which is not able to be controlled with oral pain medications.  You may be given either Flomax and/ or ditropan to help with bladder spasms and stent pain in addition to pain medications.    Your stent is attached to a string.  On Friday, you may untaped the string and pulled gently until the entire stent is removed.  If you have any issues, please call our office.  [facility] [address], [place],  [address] [phone]     AMBULATORY SURGERY  DISCHARGE INSTRUCTIONS   1) The drugs that you were given will stay in your system until tomorrow so for the next 24 hours you should not:  A) Drive an automobile B) Make any legal decisions C) Drink any alcoholic beverage   2) You may resume regular meals tomorrow.  Today it is better to start with liquids and gradually work up to solid foods.  You may eat anything you prefer, but it is better to start with liquids, then soup and crackers, and gradually work up to solid foods.   3) Please notify your doctor immediately if you have any unusual bleeding, trouble breathing, redness and pain at the surgery site, drainage, fever, or pain not relieved by medication.    4) Additional Instructions:        Please contact your physician with any problems or Same Day Surgery at [phone], Monday through Friday 6 am to 4 pm, or [place] at [facility] number at [phone].

## 2018-01-31 LAB — SURGICAL PATHOLOGY

## 2018-02-01 ENCOUNTER — Telehealth: Payer: Self-pay

## 2018-02-01 NOTE — Telephone Encounter (Signed)
FYI: Pt informed, he states when he is active and moving around he notices a considerable amount of blood in his urine. He wanted to remove his stent today, but was unsure if it was okay since you told him to keep it until Friday. I informed pt he needed to leave stent in for full duration. I also informed him that the stent can cause discomfort and blood in urine. Pt states his understanding and will wait to remove his stent on Friday.

## 2018-02-01 NOTE — Telephone Encounter (Signed)
-----   Message from [name], MD sent at 01/31/2018  9:06 PM EDT ----- Low grade noninvasive tumor as on previous occasions.  There is no indication for further treatment at this time.  We will plan for cystoscopy in 3 months for reevaluation.    [name], MD

## 2018-02-08 ENCOUNTER — Ambulatory Visit: Payer: Medicare Other | Admitting: Urology

## 2018-02-24 ENCOUNTER — Telehealth: Payer: Self-pay

## 2018-02-24 ENCOUNTER — Ambulatory Visit
Admission: RE | Admit: 2018-02-24 | Discharge: 2018-02-24 | Disposition: A | Discharge status: Home / Self Care | Payer: Medicare Other | Source: Ambulatory Visit | Attending: Urology | Admitting: Urology

## 2018-02-24 DIAGNOSIS — C679 Malignant neoplasm of bladder, unspecified: Secondary | ICD-10-CM | POA: Diagnosis not present

## 2018-02-24 DIAGNOSIS — N200 Calculus of kidney: Secondary | ICD-10-CM | POA: Diagnosis not present

## 2018-02-24 NOTE — Telephone Encounter (Signed)
Pt informed

## 2018-02-24 NOTE — Telephone Encounter (Signed)
-----   Message from [name], MD sent at 02/24/2018  3:02 PM EDT ----- Renal ultrasound looks fine.  [name], MD

## 2018-02-28 ENCOUNTER — Ambulatory Visit (INDEPENDENT_AMBULATORY_CARE_PROVIDER_SITE_OTHER): Payer: Medicare Other | Admitting: Urology

## 2018-02-28 ENCOUNTER — Encounter: Payer: Self-pay | Admitting: Urology

## 2018-02-28 VITALS — BP 175/90 | HR 63 | Wt 194.0 lb

## 2018-02-28 DIAGNOSIS — N200 Calculus of kidney: Secondary | ICD-10-CM

## 2018-02-28 DIAGNOSIS — C672 Malignant neoplasm of lateral wall of bladder: Secondary | ICD-10-CM | POA: Diagnosis not present

## 2018-02-28 NOTE — Progress Notes (Signed)
02/28/2018 10:03 AM   Cory Hardin [birth year] [id number]  Referring provider: Albina Billet, MD [address]   [place], [place] [address]  Chief Complaint  Patient presents with  . Results    4wk w/RUS    HPI: 72 yo M w/ recurrent nephrolithiasis, bladder stones, recurrent bladder CA, BPH s/p TURP, history of elevated PSA who returns today for one-month postop following ureteroscopy and TURBT.  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 He has a recurrence and undenwent TURBT in 08/2014, pathology LgTA.  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  S/p BCG x 6 completed 02/2015.  Cysto 06/2015 suspicious for recurrent, TURBT c/w granulomatous inflammation/ chronic cystitis.   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   Nephrolithiasis: He has an extensive history of recurrent stones. He is undergone multiple stone procedures in the past. 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   Most recent intervention s/p R URS, LL on 06/23/15 s/p URS.   Follow-up renal ultrasound from 09/2015 shows no hydronephrosis. Bilateral nonobstructing stones present.Marland Kitchen KUB 01/2016  with 1.6 cm LLP stone, increased from 1.3 6 months ago.  Right small stone stable.  KUB 07/2016 stable. New gall stones appreciated.  KUB 01/2017 unchanged.    R URS, LL, stent on 01/2018 for asymptomatic right distal ureteral stone incidental on KUB.    Follow up RUS today without hydronephrosis.  He denies any flank pain or residual symptoms following ureteroscopy.  BPH: History of BPH, bladder stones S/p TURP 04/2014.  Voiding well.  No urinary complaints today.  Unchanged.  Elevated PSA: PSA elevated on 10/10/15 to 4.3,  3.3 7/17, and 2.9 on 10/217.  DRE 7/17 unremarkable.  Elected discontinuation of PSA screening.    PMH: Past Medical  History:  Diagnosis Date  . Anxiety   . Asthma   . Bilateral kidney stones   . BPH (benign prostatic hyperplasia)   . Cancer ([place])    bladder  . Cancer of lateral wall of urinary bladder ([place]) 12/05/2014  . Chronic kidney disease    stones,renal failure  . Diabetes mellitus without complication ([place])   . DVT (deep venous thrombosis) ([place])   . GERD (gastroesophageal reflux disease)   . Hematuria   . Hyperlipidemia   . Hypertension     Surgical History: Past Surgical History:  Procedure Laterality Date  . APPENDECTOMY    . bladder stones    . CYSTOSCOPY    . CYSTOSCOPY W/ RETROGRADES Bilateral 01/30/2018   Procedure: CYSTOSCOPY WITH RETROGRADE PYELOGRAM;  Surgeon: [name], MD;  Location: [facility];  Service: Urology;  Laterality: Bilateral;  . CYSTOSCOPY WITH BIOPSY N/A 11/20/2014   Procedure: CYSTOSCOPY WITH BIOPSY;  Surgeon: [name], MD;  Location: [facility];  Service: Urology;  Laterality: N/A;  . CYSTOSCOPY WITH BIOPSY N/A 01/30/2018   Procedure: CYSTOSCOPY WITH Bladder BIOPSY;  Surgeon: [name], MD;  Location: [facility];  Service: Urology;  Laterality: N/A;  . CYSTOSCOPY WITH STENT PLACEMENT Right 06/23/2015   Procedure: CYSTOSCOPY WITH STENT PLACEMENT;  Surgeon: [name], MD;  Location: [facility];  Service: Urology;  Laterality: Right;  . CYSTOSCOPY/URETEROSCOPY/HOLMIUM LASER/STENT PLACEMENT Right 01/30/2018   Procedure: CYSTOSCOPY/URETEROSCOPY/HOLMIUM LASER/STENT PLACEMENT;  Surgeon: [name], MD;  Location: [facility];  Service: Urology;  Laterality: Right;  . IVC FILTER INSERTION N/A 08/11/2016  Procedure: IVC Filter Insertion;  Surgeon: Algernon Huxley, MD;  Location: [facility];  Service: Cardiovascular;  Laterality: N/A;  . IVC FILTER REMOVAL N/A 01/10/2017   Procedure: IVC Filter Removal;  Surgeon: Algernon Huxley, MD;  Location: [facility];  Service: Cardiovascular;  Laterality: N/A;  . PERCUTANEOUS NEPHROLITHOTRIPSY    . PULMONARY  THROMBECTOMY N/A 08/11/2016   Procedure: Pulmonary Thrombectomy;  Surgeon: Algernon Huxley, MD;  Location: [facility];  Service: Cardiovascular;  Laterality: N/A;  . TRANSURETHRAL RESECTION OF BLADDER TUMOR N/A 06/23/2015   Procedure: TRANSURETHRAL RESECTION OF BLADDER TUMOR (TURBT);  Surgeon: [name], MD;  Location: [facility];  Service: Urology;  Laterality: N/A;  . TRANSURETHRAL RESECTION OF BLADDER TUMOR WITH GYRUS (TURBT-GYRUS)    . URETEROSCOPY WITH HOLMIUM LASER LITHOTRIPSY Right 06/23/2015   Procedure: URETEROSCOPY WITH HOLMIUM LASER LITHOTRIPSY;  Surgeon: [name], MD;  Location: [facility];  Service: Urology;  Laterality: Right;    Home Medications:  Allergies as of 02/28/2018   No Known Allergies     Medication List        Accurate as of 02/28/18 10:03 AM. Always use your most recent med list.          ACCU-CHEK AVIVA PLUS test strip Generic drug:  glucose blood   apixaban 5 MG Tabs tablet Commonly known as:  ELIQUIS 10 mg po bid for 6 days, then 5 mg po bid.   carvedilol 12.5 MG tablet Commonly known as:  COREG Take 12.5 mg by mouth 2 (two) times daily with a meal.   docusate sodium 100 MG capsule Commonly known as:  COLACE Take 1 capsule (100 mg total) by mouth 2 (two) times daily.   fenofibrate 145 MG tablet Commonly known as:  TRICOR Take 145 mg by mouth daily.   glimepiride 1 MG tablet Commonly known as:  AMARYL Take 1 mg by mouth daily.   hydrochlorothiazide 25 MG tablet Commonly known as:  HYDRODIURIL Take 1 tablet (25 mg total) by mouth daily.   omeprazole 20 MG capsule Commonly known as:  PRILOSEC Take 20 mg by mouth daily.   pravastatin 40 MG tablet Commonly known as:  PRAVACHOL Take 40 mg by mouth every evening.       Allergies: No Known Allergies  Family History: Family History  Problem Relation Age of Onset  . Heart disease Father   . Heart disease Mother   . Heart failure Mother   . COPD Mother   . Cancer  Maternal Aunt   . Heart disease Brother   . Diabetes Brother   . Kidney disease Neg Hx   . Prostate cancer Neg Hx     Social History:  reports that he quit smoking about 35 years ago. His smoking use included cigarettes. He has a 3.75 pack-year smoking history. He has never used smokeless tobacco. He reports that he drinks about 1.0 standard drinks of alcohol per week. He reports that he does not use drugs.  ROS: UROLOGY Frequent Urination?: No Hard to postpone urination?: No Burning/pain with urination?: No Get up at night to urinate?: No Leakage of urine?: No Urine stream starts and stops?: No Trouble starting stream?: No Do you have to strain to urinate?: No Blood in urine?: No Urinary tract infection?: No Sexually transmitted disease?: No Injury to kidneys or bladder?: No Painful intercourse?: No Weak stream?: No Erection problems?: No Penile pain?: No  Gastrointestinal Nausea?: No Vomiting?: No Indigestion/heartburn?: No Diarrhea?: No  Constipation?: No  Constitutional Fever: No Night sweats?: No Weight loss?: No Fatigue?: No  Skin Skin rash/lesions?: No Itching?: No  Eyes Blurred vision?: No Double vision?: No  Ears/Nose/Throat Sore throat?: No Sinus problems?: No  Hematologic/Lymphatic Swollen glands?: No Easy bruising?: No  Cardiovascular Leg swelling?: No Chest pain?: No  Respiratory Cough?: No Shortness of breath?: No  Endocrine Excessive thirst?: No  Musculoskeletal Back pain?: No Joint pain?: No  Neurological Headaches?: No Dizziness?: No  Psychologic Depression?: No Anxiety?: No  Physical Exam: BP (!) 175/90   Pulse 63   Wt 194 lb (88 kg)   BMI 26.31 kg/m   Constitutional:  Alert and oriented, No acute distress. HEENT: [place] AT, moist mucus membranes.  Trachea midline, no masses. Cardiovascular: No clubbing, cyanosis, or edema. Respiratory: Normal respiratory effort, no increased work of breathing. Skin: No rashes,  bruises or suspicious lesions. Neurologic: Grossly intact, no focal deficits, moving all 4 extremities. Psychiatric: Normal mood and affect.  Laboratory Data: Lab Results  Component Value Date   WBC 9.1 08/13/2016   HGB 12.6 (L) 08/13/2016   HCT 36.1 (L) 08/13/2016   MCV 90.9 08/13/2016   PLT 287 08/13/2016    Lab Results  Component Value Date   CREATININE 1.46 (H) 01/23/2018    Urinalysis N/a  Pertinent Imaging: Results for orders placed during the hospital encounter of 02/24/18  US RENAL   Narrative CLINICAL DATA:  Malignant neoplasm of urinary bladder.  EXAM: RENAL / URINARY TRACT ULTRASOUND COMPLETE  COMPARISON:  KUB 01/18/2018.  Ultrasound 09/29/2015.  CT 03/05/2014.  FINDINGS: Right Kidney:  Length: 11.8 cm. Echogenicity within normal limits. 8 mm calculus lower pole. No mass or hydronephrosis visualized.  Left Kidney:  Length: 10.4 cm. Echogenicity within normal limits. 13 mm calculus lower pole. No mass or hydronephrosis visualized.  Bladder:  Appears normal for degree of bladder distention. Nonshadowing echogenic foci noted along the posterior aspect of the bladder. These are non mobile. A process such as bladder malignancy cannot be excluded.  IMPRESSION: 1.  Bilateral nonobstructing nephrolithiasis.  2. Ill-defined echogenic foci noted along the posterior bladder wall. Bladder malignancy could present in this fashion. Urologic evaluation should be considered.   Electronically Signed   By: Marcello Moores  Register   On: 02/24/2018 11:47     Assessment & Plan:    1. Malignant neoplasm of lateral wall of urinary bladder [facility]) Surgical pathology reviewed Low-grade noninvasive recurrence We will continue every 3 month surveillance Limited role for BCG especially in setting of drug shortage He is agreeable this plan  2. Calcium nephrolithiasis Current calcium nephrolithiasis No silent hydronephrosis on follow-up renal ultrasound Continues on  hydrochlorothiazide for hypercalciuria Continue to encourage copious hydration We will monitor with at least annual KUB    Return in about 3 months (around 05/31/2018) for cysto.  [name], MD  [facility] [address], [place], [place] [address] [phone]

## 2018-03-02 ENCOUNTER — Ambulatory Visit: Payer: Medicare Other | Admitting: Urology

## 2018-03-10 ENCOUNTER — Ambulatory Visit (INDEPENDENT_AMBULATORY_CARE_PROVIDER_SITE_OTHER): Payer: Medicare Other | Admitting: Vascular Surgery

## 2018-03-10 ENCOUNTER — Encounter (INDEPENDENT_AMBULATORY_CARE_PROVIDER_SITE_OTHER): Payer: Self-pay | Admitting: Vascular Surgery

## 2018-03-10 VITALS — BP 155/88 | HR 61 | Resp 16 | Ht 72.0 in | Wt 204.8 lb

## 2018-03-10 DIAGNOSIS — I87009 Postthrombotic syndrome without complications of unspecified extremity: Secondary | ICD-10-CM | POA: Diagnosis not present

## 2018-03-10 DIAGNOSIS — I2782 Chronic pulmonary embolism: Secondary | ICD-10-CM

## 2018-03-10 DIAGNOSIS — E785 Hyperlipidemia, unspecified: Secondary | ICD-10-CM | POA: Diagnosis not present

## 2018-03-10 DIAGNOSIS — C672 Malignant neoplasm of lateral wall of bladder: Secondary | ICD-10-CM

## 2018-03-10 DIAGNOSIS — I2609 Other pulmonary embolism with acute cor pulmonale: Secondary | ICD-10-CM

## 2018-03-10 NOTE — Progress Notes (Signed)
MRN : [id number]  Cory Hardin is a 72 y.o. ([birth year]) male who presents with chief complaint of  Chief Complaint  Patient presents with  . Follow-up    1year follow up  .  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  About a year and a half ago, he underwent extensive thrombectomy and thrombolytic therapy both of the leg and to the lungs for DVT.  He had his filter removed last summer.  He has been on anticoagulation now for about a year and a half.  This has become cost prohibitive and he asks if there is any way he can stop this.  He wears his compression stockings fairly regularly but has really no pain or swelling in the legs.  He said no bleeding issues or problems with blood thinners.  Current Outpatient Medications  Medication Sig Dispense Refill  . ACCU-CHEK AVIVA PLUS test strip     . apixaban (ELIQUIS) 5 MG TABS tablet 10 mg po bid for 6 days, then 5 mg po bid. (Patient taking differently: Take 5 mg by mouth 2 (two) times daily. ) 60 tablet 2  . carvedilol (COREG) 12.5 MG tablet Take 12.5 mg by mouth 2 (two) times daily with a meal.    . fenofibrate (TRICOR) 145 MG tablet Take 145 mg by mouth daily.  5  . glimepiride (AMARYL) 1 MG tablet Take 1 mg by mouth daily.     . hydrochlorothiazide (HYDRODIURIL) 25 MG tablet Take 1 tablet (25 mg total) by mouth daily. 90 tablet 3  . omeprazole (PRILOSEC) 20 MG capsule Take 20 mg by mouth daily.     . pravastatin (PRAVACHOL) 40 MG tablet Take 40 mg by mouth every evening.     . docusate sodium (COLACE) 100 MG capsule Take 1 capsule (100 mg total) by mouth 2 (two) times daily. (Patient not taking: Reported on 03/10/2018) 60 capsule 0   No current facility-administered medications for this visit.     Past Medical History:  Diagnosis Date  . Anxiety   . Asthma   . Bilateral kidney stones   . BPH (benign prostatic hyperplasia)   . Cancer ([place])    bladder  . 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 Chronic kidney disease    stones,renal failure  . Diabetes mellitus without complication ([place])   . DVT (deep venous thrombosis) ([place])   . GERD (gastroesophageal reflux disease)   . Hematuria   . Hyperlipidemia   . Hypertension     Past Surgical History:  Procedure Laterality Date  . APPENDECTOMY    . bladder stones    . CYSTOSCOPY    . CYSTOSCOPY W/ RETROGRADES Bilateral 01/30/2018   Procedure: CYSTOSCOPY WITH RETROGRADE PYELOGRAM;  Surgeon: [name], MD;  Location: [facility];  Service: Urology;  Laterality: Bilateral;  . CYSTOSCOPY WITH BIOPSY N/A 11/20/2014   Procedure: CYSTOSCOPY WITH BIOPSY;  Surgeon: [name], MD;  Location: [facility];  Service: Urology;  Laterality: N/A;  . CYSTOSCOPY WITH BIOPSY N/A 01/30/2018   Procedure: CYSTOSCOPY WITH Bladder BIOPSY;  Surgeon: [name], MD;  Location: [facility];  Service: Urology;  Laterality: N/A;  . CYSTOSCOPY WITH STENT PLACEMENT Right 06/23/2015   Procedure: CYSTOSCOPY WITH STENT PLACEMENT;  Surgeon: [name], MD;  Location: [facility];  Service: Urology;  Laterality: Right;  . CYSTOSCOPY/URETEROSCOPY/HOLMIUM LASER/STENT PLACEMENT Right 01/30/2018   Procedure: CYSTOSCOPY/URETEROSCOPY/HOLMIUM LASER/STENT PLACEMENT;  Surgeon: [name],  MD;  Location: [facility];  Service: Urology;  Laterality: Right;  . IVC FILTER INSERTION N/A 08/11/2016   Procedure: IVC Filter Insertion;  Surgeon: Algernon Huxley, MD;  Location: [facility];  Service: Cardiovascular;  Laterality: N/A;  . IVC FILTER REMOVAL N/A 01/10/2017   Procedure: IVC Filter Removal;  Surgeon: Algernon Huxley, MD;  Location: [facility];  Service: Cardiovascular;  Laterality: N/A;  . PERCUTANEOUS NEPHROLITHOTRIPSY    . PULMONARY THROMBECTOMY N/A 08/11/2016   Procedure: Pulmonary Thrombectomy;  Surgeon: Algernon Huxley, MD;  Location: [facility];  Service: Cardiovascular;  Laterality: N/A;  . TRANSURETHRAL RESECTION OF BLADDER TUMOR  N/A 06/23/2015   Procedure: TRANSURETHRAL RESECTION OF BLADDER TUMOR (TURBT);  Surgeon: [name], MD;  Location: [facility];  Service: Urology;  Laterality: N/A;  . TRANSURETHRAL RESECTION OF BLADDER TUMOR WITH GYRUS (TURBT-GYRUS)    . URETEROSCOPY WITH HOLMIUM LASER LITHOTRIPSY Right 06/23/2015   Procedure: URETEROSCOPY WITH HOLMIUM LASER LITHOTRIPSY;  Surgeon: [name], MD;  Location: [facility];  Service: Urology;  Laterality: Right;    Social History  Substance Use Topics  . Smoking status: Former Smoker    Packs/day: 0.25    Years: 15.00    Types: Cigarettes    Quit date: 11/13/1982  . Smokeless tobacco: Never Used  . Alcohol use 0.6 oz/week     1 Standard drinks or equivalent per week      Comment: rare     Family History      Family History  Problem Relation Age of Onset  . Heart disease Father   . Heart disease Mother   . Cancer Maternal Aunt   . Heart disease Brother   . Kidney disease Neg Hx   . Prostate cancer Neg Hx      No Known Allergies   REVIEW OF SYSTEMS(Negative unless checked)  Constitutional: _0 Weight loss_1 Fever_2 Chills Cardiac:_3 Chest pain_4 Chest pressure_5 Palpitations _6 Shortness of breath when laying flat _7 Shortness of breath at rest _8 Shortness of breath with exertion. Vascular: _9 Pain in legs with walking_10 Pain in legsat rest_11 Pain in legs when laying flat _12 Claudication _13 Pain in feet when walking _14 Pain in feet at rest _15 Pain in feet when laying flat _16 History of DVT _17 Phlebitis _18 Swelling in legs _19 Varicose veins _20 Non-healing ulcers Pulmonary: _21 Uses home oxygen _22 Productive cough_23 Hemoptysis _24 Wheeze _25 COPD _26 Asthma Neurologic: _27 Dizziness _28 Blackouts _29 Seizures _30 History of stroke _31 History of TIA_32 Aphasia _33 Temporary blindness_34 Dysphagia _35 Weaknessor numbness in arms _36 Weakness or numbnessin legs Musculoskeletal:  _37 Arthritis _38 Joint swelling _39 Joint pain _40 Low back pain Hematologic:_41 Easy bruising_42 Easy bleeding _43 Hypercoagulable state _44 Anemic  Gastrointestinal:_45 Blood in stool_46 Vomiting blood_47 Gastroesophageal reflux/heartburn_48 Abdominal pain Genitourinary: _49 Chronic kidney disease _50 Difficulturination _51 Frequenturination _52 Burning with urination_53 Hematuria X Bladder tumor Skin: _54 Rashes _55 Ulcers _56 Wounds Psychological: _57 History of anxiety_58 History of major depression.    Physical Examination  BP (!) 155/88 (BP Location: Right Arm)   Pulse 61   Resp 16   Ht 6' (1.829 m)   Wt 204 lb 12.8 oz (92.9 kg)   BMI 27.78 kg/m  Gen:  WD/WN, NAD Head: [place]/AT, No temporalis wasting. Ear/Nose/Throat: Hearing grossly intact, nares w/o erythema or drainage Eyes: Conjunctiva clear. Sclera non-icteric Neck: Supple.  Trachea midline Pulmonary:  Good air movement, no use of accessory muscles.  Cardiac: RRR, no JVD Vascular:  Vessel Right Left  Radial Palpable Palpable                          PT Palpable Palpable  DP Palpable Palpable   Musculoskeletal: M/S 5/5 throughout.  No deformity or atrophy.  No edema. Neurologic: Sensation grossly intact in extremities.  Symmetrical.  Speech is fluent.  Psychiatric: Judgment intact, Mood & affect appropriate for pt's clinical situation. Dermatologic: No rashes or ulcers noted.  No cellulitis or open wounds.       Labs Recent Results (from the past 2160 hour(s))  Urinalysis, Complete     Status: Abnormal   Collection Time: 01/18/18  9:54 AM  Result Value Ref Range   Specific Gravity, UA 1.025 1.005 - 1.030   pH, UA 5.5 5.0 - 7.5   Color, UA Yellow Yellow   Appearance Ur Cloudy (A) Clear   Leukocytes, UA Negative Negative   Protein, UA 2+ (A) Negative/Trace   Glucose, UA Negative Negative   Ketones, UA Negative Negative   RBC, UA 3+ (A) Negative   Bilirubin, UA Negative Negative   Urobilinogen,  Ur 0.2 0.2 - 1.0 mg/dL   Nitrite, UA Negative Negative   Microscopic Examination See below:   Microscopic Examination     Status: Abnormal   Collection Time: 01/18/18  9:54 AM  Result Value Ref Range   WBC, UA None seen 0 - 5 /hpf   RBC, UA >30 (H) 0 - 2 /hpf   Epithelial Cells (non renal) 0-10 0 - 10 /hpf   Mucus, UA Present (A) Not Estab.   Bacteria, UA Moderate (A) None seen/Few  CULTURE, URINE COMPREHENSIVE     Status: None   Collection Time: 01/18/18 12:42 PM  Result Value Ref Range   Urine Culture, Comprehensive Final report    Organism ID, Bacteria Comment     Comment: Mixed urogenital flora 10,000-25,000 colony forming units per mL   Basic metabolic panel     Status: Abnormal   Collection Time: 01/23/18  3:05 PM  Result Value Ref Range   Sodium 143 135 - 145 mmol/L   Potassium 4.1 3.5 - 5.1 mmol/L   Chloride 105 98 - 111 mmol/L   CO2 30 22 - 32 mmol/L   Glucose, Bld 191 (H) 70 - 99 mg/dL   BUN 22 8 - 23 mg/dL   Creatinine, Ser 1.46 (H) 0.61 - 1.24 mg/dL   Calcium 9.9 8.9 - 10.3 mg/dL   GFR calc non Af Amer 46 (L) >60 mL/min   GFR calc Af Amer 54 (L) >60 mL/min    Comment: (NOTE) The eGFR has been calculated using the CKD EPI equation. This calculation has not been validated in all clinical situations. eGFR's persistently <60 mL/min signify possible Chronic Kidney Disease.    Anion gap 8 5 - 15    Comment: Performed at [facility], [place], [place] [address]  Glucose, capillary     Status: Abnormal   Collection Time: 01/30/18  8:24 AM  Result Value Ref Range   Glucose-Capillary 144 (H) 70 - 99 mg/dL  Surgical pathology     Status: None   Collection Time: 01/30/18  9:31 AM  Result Value Ref Range   SURGICAL PATHOLOGY      Surgical Pathology CASE: [id number] PATIENT: Yordan Wallis Surgical Pathology Report     SPECIMEN SUBMITTED: A. Bladder tumor B. Bladder erythema  CLINICAL HISTORY: None provided  PRE-OPERATIVE  DIAGNOSIS: Bladder lesion, nephrolithiasis  POST-OPERATIVE DIAGNOSIS: Same as pre-op     DIAGNOSIS: A.  BLADDER TUMOR; TURBT: - LOW-GRADE PAPILLARY UROTHELIAL CARCINOMA, NONINVASIVE. - MUSCULARIS PROPRIA AND LAMINA PROPRIA ARE IDENTIFIED.  B.  BLADDER ERYTHEMA; BIOPSY: - EARLY PAPILLARY UROTHELIAL CARCINOMA, LOW-GRADE AND NONINVASIVE. - LAMINA PROPRIA IS  IDENTIFIED. - MUSCULARIS PROPRIA IS NOT IDENTIFIED. - DEEPER HISTOLOGIC SECTIONS WERE EXAMINED.   GROSS DESCRIPTION: A. Labeled: Bladder tumor Received: In formalin Tissue fragment(s): 2 Size: 0.3-0.4 cm Description: Pink fragments Entirely submitted in one cassette.  B. Labeled: Bladder erythema Received: In formalin Tissue fragment(s): 3 Size: 0.3-0.4 cm Description: Pink fragments Entirely subm itted in one cassette.    Final Diagnosis performed by Delorse Lek, MD.   Electronically signed 01/31/2018 6:01:49PM The electronic signature indicates that the named Attending Pathologist has evaluated the specimen  Technical component performed at [facility], [address] [place], [place] [address] Lab: [phone] Dir: Rush Farmer, MD, MMM  Professional component performed at [facility], [facility], [place], [place], [place] [address] Lab: [phone] Dir: Dellia Nims. Rubinas, MD   Glucose, capillary     Status: Abnormal   Collection Time: 01/30/18 10:15 AM  Result Value Ref Range   Glucose-Capillary 136 (H) 70 - 99 mg/dL    Radiology US Renal  Result Date: 02/24/2018 CLINICAL DATA:  Malignant neoplasm of urinary bladder. EXAM: RENAL / URINARY TRACT ULTRASOUND COMPLETE COMPARISON:  KUB 01/18/2018.  Ultrasound 09/29/2015.  CT 03/05/2014. FINDINGS: Right Kidney: Length: 11.8 cm. Echogenicity within normal limits. 8 mm calculus lower pole. No mass or hydronephrosis visualized. Left Kidney: Length: 10.4 cm. Echogenicity within normal limits. 13 mm calculus lower pole. No mass or hydronephrosis  visualized. Bladder: Appears normal for degree of bladder distention. Nonshadowing echogenic foci noted along the posterior aspect of the bladder. These are non mobile. A process such as bladder malignancy cannot be excluded. IMPRESSION: 1.  Bilateral nonobstructing nephrolithiasis. 2. Ill-defined echogenic foci noted along the posterior bladder wall. Bladder malignancy could present in this fashion. Urologic evaluation should be considered. Electronically Signed   By: Marcello Moores  Register   On: 02/24/2018 11:47    Assessment/Plan Bladder cancer Followed by Urology  Hyperlipidemia lipid control important in reducing the progression of atherosclerotic disease. Continue statin therapy  Uncomplicated postphlebitic syndrome Swelling currently pretty mild. Compression stockings and elevation as needed.  Pulmonary embolism ([place]) The patient had an unprovoked thrombosis other than his history of bladder tumor.  He has taken anticoagulation for about a year and a half.  This has become cost prohibitive and he asks if there is any way that he can change over to aspirin.  I think that is fairly reasonable.  He does have a higher risk of recurrent thrombosis, but 1 year would be standard therapy.  He will start taking an aspirin a day.  He can wear his compression stockings as needed.  I will see him back as needed    Leotis Pain, MD  03/10/2018 11:48 AM    This note was created with Dragon medical transcription system.  Any errors from dictation are purely unintentional

## 2018-03-10 NOTE — Assessment & Plan Note (Signed)
The patient had an unprovoked thrombosis other than his history of bladder tumor.  He has taken anticoagulation for about a year and a half.  This has become cost prohibitive and he asks if there is any way that he can change over to aspirin.  I think that is fairly reasonable.  He does have a higher risk of recurrent thrombosis, but 1 year would be standard therapy.  He will start taking an aspirin a day.  He can wear his compression stockings as needed.  I will see him back as needed

## 2018-03-10 NOTE — Patient Instructions (Signed)
Deep Vein Thrombosis Deep vein thrombosis (DVT) is a condition in which a blood clot forms in a deep vein, such as a lower leg, thigh, or arm vein. A clot is blood that has thickened into a gel or solid. This condition is dangerous. It can lead to serious and even life-threatening complications if the clot travels to the lungs and causes a blockage (pulmonary embolism). It can also damage veins in the leg. This can result in leg pain, swelling, discoloration, and sores (post-thrombotic syndrome). What are the causes? This condition may be caused by:  A slowdown of blood flow.  Damage to a vein.  A condition that makes blood clot more easily.  What increases the risk? The following factors may make you more likely to develop this condition:  Being overweight.  Being elderly, especially over age 60.  Sitting or lying down for more than four hours.  Lack of physical activity (sedentary lifestyle).  Being pregnant, giving birth, or having recently given birth.  Taking medicines that contain estrogen.  Smoking.  A history of any of the following: ? Blood clots or blood clotting disease. ? Peripheral vascular disease. ? Inflammatory bowel disease. ? Cancer. ? Heart disease. ? Genetic conditions that affect how blood clots. ? Neurological diseases that affect the legs (leg paresis). ? Injury. ? Major or lengthy surgery. ? A central line placed inside a large vein.  What are the signs or symptoms? Symptoms of this condition include:  Swelling, pain, or tenderness in an arm or leg.  Warmth, redness, or discoloration in an arm or leg.  If the clot is in your leg, symptoms may be more noticeable or worse when you stand or walk. Some people do not have any symptoms. How is this diagnosed? This condition is diagnosed with:  A medical history.  A physical exam.  Tests, such as: ? Blood tests. These are done to see how your blood clots. ? Imaging tests. These are done to  check for clots. Tests may include:  Ultrasound.  CT scan.  MRI.  X-ray.  Venogram. For this test, X-rays are taken after a dye is injected into a vein.  How is this treated? Treatment for this condition depends on the cause, your risk for bleeding or developing more clots, and any medical conditions you have. Treatment may include:  Taking blood thinners (also called anticoagulants). These medicines may be taken by mouth, injected under the skin, or injected through an IV tube (catheter). These medicines prevent clots from forming.  Injecting medicine that dissolves blood clots into the affected vein (catheter-directed thrombolysis).  Having surgery. Surgery may be done to: ? Remove the clot. ? Place a filter in a large vein to catch blood clots before they reach the lungs.  Some treatments may be continued for up to six months. Follow these instructions at home: If you are taking an oral blood thinner:  Take the medicine exactly as told by your health care provider. Some blood thinners need to be taken at the same time every day. Do not skip a dose.  Ask your health care provider about what foods and drugs interact with the medicine.  Ask about possible side effects. General instructions  Blood thinners can cause easy bruising and difficulty stopping bleeding. Because of this, if you are taking or were given a blood thinner: ? Hold pressure over cuts for longer than usual. ? Tell your dentist and other health care providers that you are taking blood thinners before   having any procedures that can cause bleeding. ? Avoid contact sports.  Take over-the-counter and prescription medicines only as told by your health care provider.  Return to your normal activities as told by your health care provider. Ask your health care provider what activities are safe for you.  Wear compression stockings if recommended by your health care provider.  Keep all follow-up visits as told by  your health care provider. This is important. How is this prevented? To lower your risk of developing this condition again:  For 30 or more minutes every day, do an activity that: ? Involves moving your arms and legs. ? Increases your heart rate.  When traveling for longer than four hours: ? Exercise your arms and legs every hour. ? Drink plenty of water. ? Avoid drinking alcohol.  Avoid sitting or lying for a long time without moving your legs.  Stay a healthy weight.  If you are a woman who is older than age 35, avoid unnecessary use of medicines that contain estrogen.  Do not use any products that contain nicotine or tobacco, such as cigarettes and e-cigarettes. This is especially important if you take estrogen medicines. If you need help quitting, ask your health care provider.  Contact a health care provider if:  You miss a dose of your blood thinner.  You have nausea, vomiting, or diarrhea that lasts for more than one day.  Your menstrual period is heavier than usual.  You have unusual bruising. Get help right away if:  You have new or increased pain, swelling, or redness in an arm or leg.  You have numbness or tingling in an arm or leg.  You have shortness of breath.  You have chest pain.  You have a rapid or irregular heartbeat.  You feel light-headed or dizzy.  You cough up blood.  There is blood in your vomit, stool, or urine.  You have a serious fall or accident, or you hit your head.  You have a severe headache or confusion.  You have a cut that will not stop bleeding. These symptoms may represent a serious problem that is an emergency. Do not wait to see if the symptoms will go away. Get medical help right away. Call your local emergency services (911 in the [place] Do not drive yourself to the hospital. Summary  DVT is a condition in which a blood clot forms in a deep vein, such as a lower leg, thigh, or arm vein.  Symptoms can include swelling,  warmth, pain, and redness in your leg or arm.  Treatment may include taking blood thinners, injecting medicine that dissolves blood clots,wearing compression stockings, or surgery.  If you are prescribed blood thinners, take them exactly as told. This information is not intended to replace advice given to you by your health care provider. Make sure you discuss any questions you have with your health care provider. Document Released: 06/21/2005 Document Revised: 07/24/2016 Document Reviewed: 07/24/2016 Elsevier Interactive Patient Education  2018 [employer]vier Inc.  

## 2018-06-06 NOTE — Progress Notes (Signed)
   06/07/2018  CC:  Chief Complaint  Patient presents with  . Cysto    HPI: Cory Hardin is a 72 yo M with a history of recurrent nephrolithiasis, bladder stones, recurrent bladder CA, BPH s/p TURP, history of elevated PSA who returns today for a three-month follow-up for a cystoscopy. His last visit with [place] was on 02/28/2018.  Vitals:   06/07/18 0852  BP: (!) 173/89  Pulse: 67   NED. A&Ox3.   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 He has a recurrence and undenwent TURBT in 08/2014, pathology LgTA. 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 S/p BCG x 6 completed 02/2015. Cysto 06/2015 suspicious for recurrent, TURBT c/w granulomatous inflammation/ chronic cystitis.  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  Nephrolithiasis: He has an extensive history of recurrent stones. He is undergone multiple stone procedures in the past. 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  Most recent intervention s/p R URS, LL on 06/23/15 s/p URS.  Follow-up renal ultrasound from 09/2015 shows no hydronephrosis. Bilateral nonobstructing stones present.Marland Kitchen KUB 01/2016 with 1.6 cm LLP stone, increased from 1.3 6 months ago. Right small stone stable.  KUB 07/2016 stable. New gall stones appreciated.  KUB 01/2017 unchanged. R URS, LL, stent on 01/2018 for asymptomatic right distal ureteral stone incidental on KUB.    BPH: History of BPH, bladder stones S/p TURP 04/2014. Voiding well. No urinary complaints today.  Unchanged.  Elevated PSA: PSA elevated on 10/10/15 to 4.3, 3.3 7/17, and 2.9 on 10/217. DRE 7/17 unremarkable. Elected discontinuation of PSA screening.  Cystoscopy Procedure Note  Patient identification was  confirmed, informed consent was obtained, and patient was prepped using Betadine solution.  Lidocaine jelly was administered per urethral meatus.     Pre-Procedure: - Inspection reveals a normal caliber ureteral meatus.  Procedure: The flexible cystoscope was introduced without difficulty - No urethral strictures/lesions are present. - Normal prostate  - Normal bladder neck - Bilateral ureteral orifices identified - Bladder mucosa  reveals no ulcers, tumors, or lesions - No bladder stones - No trabeculation -3 areas of Dystrophic calcification less than 1 cm posterior right lateral bladder wall,Trigonous is normal, ?previous biopsy sites   Retroflexion is unremarkable.   Post-Procedure: - Patient tolerated the procedure well  Assessment/ Plan:  1. History bladder cancer History Low-grade noninvasive, last recurrent 01/2018 dystrophic calcification appreciated today, will follow closely Mr. Herringshaw somewhat agitated today about continuation of q3 month surveillance, negotiated to q4 month after lengthy discussion of risk/ benefits Return in 4 months (arount 10/07/2018) for cysto   Return in about 4 months (around 10/07/2018) for Cysto.  Dolores Frame, am acting as a scribe for Dr. [name],  I have reviewed the above documentation for accuracy and completeness, and I agree with the above.   [name], MD

## 2018-06-07 ENCOUNTER — Ambulatory Visit: Payer: Medicare Other | Admitting: Urology

## 2018-06-07 ENCOUNTER — Encounter: Payer: Self-pay | Admitting: Urology

## 2018-06-07 VITALS — BP 173/89 | HR 62 | Ht 71.0 in | Wt 195.0 lb

## 2018-06-07 DIAGNOSIS — Z8551 Personal history of malignant neoplasm of bladder: Secondary | ICD-10-CM

## 2018-06-07 LAB — URINALYSIS, COMPLETE
Bilirubin, UA: NEGATIVE
Ketones, UA: NEGATIVE
Leukocytes, UA: NEGATIVE
Nitrite, UA: NEGATIVE
Protein, UA: NEGATIVE
Specific Gravity, UA: >1.03 — ABNORMAL HIGH (ref 1.005–1.030)
Urobilinogen, Ur: 0.2 mg/dL (ref 0.2–1.0)
pH, UA: 5.5 (ref 5.0–7.5)

## 2018-06-07 LAB — MICROSCOPIC EXAMINATION

## 2018-06-14 ENCOUNTER — Other Ambulatory Visit: Payer: Self-pay | Admitting: Urology

## 2018-10-11 ENCOUNTER — Other Ambulatory Visit: Payer: Medicare Other | Admitting: Urology

## 2018-11-01 ENCOUNTER — Other Ambulatory Visit: Payer: Medicare Other | Admitting: Urology

## 2018-11-08 ENCOUNTER — Other Ambulatory Visit: Payer: Self-pay | Admitting: Urology

## 2018-11-08 DIAGNOSIS — C679 Malignant neoplasm of bladder, unspecified: Secondary | ICD-10-CM

## 2018-11-14 ENCOUNTER — Other Ambulatory Visit: Payer: Self-pay | Admitting: Urology

## 2018-11-14 DIAGNOSIS — C679 Malignant neoplasm of bladder, unspecified: Secondary | ICD-10-CM

## 2018-11-14 MED ORDER — HYDROCHLOROTHIAZIDE 25 MG PO TABS: 25.0000 mg | ORAL_TABLET | Freq: Every day | ORAL | 0 refills | Status: DC

## 2018-12-05 ENCOUNTER — Other Ambulatory Visit: Payer: Self-pay

## 2018-12-05 ENCOUNTER — Ambulatory Visit: Payer: Medicare Other | Admitting: Urology

## 2018-12-05 ENCOUNTER — Encounter: Payer: Self-pay | Admitting: Urology

## 2018-12-05 VITALS — BP 195/98 | HR 56 | Ht 71.0 in | Wt 192.0 lb

## 2018-12-05 DIAGNOSIS — Z8551 Personal history of malignant neoplasm of bladder: Secondary | ICD-10-CM

## 2018-12-05 LAB — URINALYSIS, COMPLETE
Bilirubin, UA: NEGATIVE
Ketones, UA: NEGATIVE
Leukocytes,UA: NEGATIVE
Nitrite, UA: NEGATIVE
Protein,UA: NEGATIVE
Specific Gravity, UA: 1.025 (ref 1.005–1.030)
Urobilinogen, Ur: 0.2 mg/dL (ref 0.2–1.0)
pH, UA: 5.5 (ref 5.0–7.5)

## 2018-12-05 LAB — MICROSCOPIC EXAMINATION: Bacteria, UA: NONE SEEN

## 2018-12-05 NOTE — Progress Notes (Signed)
   12/05/2018  CC:  Chief Complaint  Patient presents with  . Cysto    HPI: Cory Hardin is a 73 yo M with a history of recurrent nephrolithiasis, bladder stones, recurrent bladder CA, BPH s/p TURP, history of elevated PSA who returns today for a six-month follow-up for a cystoscopy. His last visit with [place] was on 06/2018  Vitals:   12/05/18 1033  BP: (!) 195/98  Pulse: (!) 56   NED. A&Ox3.   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 He has a recurrence and undenwent TURBT in 08/2014, pathology LgTA. 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 S/p BCG x 6 completed 02/2015. Cysto 06/2015 suspicious for recurrent, TURBT c/w granulomatous inflammation/ chronic cystitis.  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  B RTG negative. RUS 02/2018  Nephrolithiasis: He has an extensive history of recurrent stones. He is undergone multiple stone procedures in the past. 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  Most recent intervention s/p R URS, LL on 06/23/15 s/p URS.  Follow-up renal ultrasound from 09/2015 shows no hydronephrosis. Bilateral nonobstructing stones present.Marland Kitchen KUB 01/2016 with 1.6 cm LLP stone, increased from 1.3 6 months ago. Right small stone stable.  KUB 07/2016 stable. New gall stones appreciated.  KUB 01/2017 unchanged. R URS, LL, stent on 01/2018 for asymptomatic right distal ureteral stone incidental on KUB.    BPH: History of BPH, bladder stones S/p TURP 04/2014. Voiding well. No urinary complaints today.  Unchanged.  Elevated PSA: PSA elevated on 10/10/15 to 4.3, 3.3 7/17, and 2.9 on 10/217. DRE 7/17 unremarkable. Elected discontinuation of PSA screening.  Cystoscopy Procedure Note   Patient identification was confirmed, informed consent was obtained, and patient was prepped using Betadine solution.  Lidocaine jelly was administered per urethral meatus.     Pre-Procedure: - Inspection reveals a normal caliber ureteral meatus.  Procedure: The flexible cystoscope was introduced without difficulty - No urethral strictures/lesions are present. - Normal prostate  - Normal bladder neck - Bilateral ureteral orifices identified - Bladder mucosa  reveals no ulcers, tumors, or lesions - No bladder stones - No trabeculation -2 adherent dystrophic calcifications on the left lateral bladder wall at previous biopsy site.  These were able to be dislodged with no underlying tumor.  Retroflexion is unremarkable.   Post-Procedure: - Patient tolerated the procedure well  Assessment/ Plan:  1. History bladder cancer History Low-grade noninvasive, last recurrent 01/2018 dystrophic calcification appreciated again today Return in 4 months (arount 10/07/2018) for cysto (as per previous discussions, negotiated to slightly less frequent cystoscopies)   Return in about 4 months (around 04/06/2019) for cysto.  [name], MD

## 2019-02-05 ENCOUNTER — Other Ambulatory Visit: Payer: Self-pay | Admitting: Urology

## 2019-02-05 DIAGNOSIS — C679 Malignant neoplasm of bladder, unspecified: Secondary | ICD-10-CM

## 2019-04-10 ENCOUNTER — Encounter: Payer: Self-pay | Admitting: Urology

## 2019-04-10 ENCOUNTER — Ambulatory Visit: Payer: Medicare Other | Admitting: Urology

## 2019-04-10 ENCOUNTER — Other Ambulatory Visit: Payer: Self-pay

## 2019-04-10 VITALS — BP 167/86 | HR 55 | Ht 71.0 in | Wt 185.0 lb

## 2019-04-10 DIAGNOSIS — R3911 Hesitancy of micturition: Secondary | ICD-10-CM

## 2019-04-10 DIAGNOSIS — N401 Enlarged prostate with lower urinary tract symptoms: Secondary | ICD-10-CM

## 2019-04-10 DIAGNOSIS — Z8551 Personal history of malignant neoplasm of bladder: Secondary | ICD-10-CM | POA: Diagnosis not present

## 2019-04-10 LAB — MICROSCOPIC EXAMINATION

## 2019-04-10 LAB — URINALYSIS, COMPLETE
Bilirubin, UA: NEGATIVE
Glucose, UA: NEGATIVE
Ketones, UA: NEGATIVE
Leukocytes,UA: NEGATIVE
Nitrite, UA: NEGATIVE
Protein,UA: NEGATIVE
Specific Gravity, UA: >1.03 — ABNORMAL HIGH (ref 1.005–1.030)
Urobilinogen, Ur: 1 mg/dL (ref 0.2–1.0)
pH, UA: 5.5 (ref 5.0–7.5)

## 2019-04-10 MED ORDER — FINASTERIDE 5 MG PO TABS: 5.0000 mg | ORAL_TABLET | Freq: Every day | ORAL | 11 refills | Status: DC

## 2019-04-10 NOTE — Progress Notes (Signed)
   04/10/2019  CC:  Chief Complaint  Patient presents with  . Cysto    HPI: Cory Hardin is a 73 yo M with a history of recurrent nephrolithiasis, bladder stones, recurrent bladder CA, BPH s/p TURP, history of elevated PSA who returns today for follow-up for a cystoscopy. His last visit with [place] was on 12/2018 without recurrence.    Please see previous notes for details.      Vitals:   04/10/19 0858  BP: (!) 167/86  Pulse: (!) 55   NED. A&Ox3.   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 He has a recurrence and undenwent TURBT in 08/2014, pathology LgTA. 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 S/p BCG x 6 completed 02/2015. Cysto 06/2015 suspicious for recurrent, TURBT c/w granulomatous inflammation/ chronic cystitis.  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  B RTG negative. RUS 02/2018  Nephrolithiasis: He has an extensive history of recurrent stones. He is undergone multiple stone procedures in the past. 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  Most recent intervention s/p R URS, LL on 06/23/15 s/p URS.  Follow-up renal ultrasound from 09/2015 shows no hydronephrosis. Bilateral nonobstructing stones present.Marland Kitchen KUB 01/2016 with 1.6 cm LLP stone, increased from 1.3 6 months ago. Right small stone stable.  KUB 07/2016 stable. New gall stones appreciated.  KUB 01/2017 unchanged. R URS, LL, stent on 01/2018 for asymptomatic right distal ureteral stone incidental on KUB.    BPH: History of BPH, bladder stones S/p TURP 04/2014. Voiding well. No urinary complaints today.  Unchanged.  Elevated PSA: PSA elevated on 10/10/15 to 4.3, 3.3 7/17, and 2.9 on 10/217. DRE 7/17 unremarkable. Elected  discontinuation of PSA screening.  Cystoscopy Procedure Note  Patient identification was confirmed, informed consent was obtained, and patient was prepped using Betadine solution.  Lidocaine jelly was administered per urethral meatus.     Pre-Procedure: - Inspection reveals a normal caliber ureteral meatus.  Procedure: The flexible cystoscope was introduced without difficulty - No urethral strictures/lesions are present. - Normal prostate  - Normal bladder neck - Bilateral ureteral orifices identified - Bladder mucosa  reveals no ulcers, tumors, or lesions - No bladder stones - Mild trabeculation -Multiple stellate scars present  Retroflexion with TURP defect and small amount of prostatic regrowth  Post-Procedure: - Patient tolerated the procedure well  Assessment/ Plan:  1. History bladder cancer History Low-grade noninvasive, last recurrent 01/2018 NED today Return in 6 months for cysto (as per previous discussions, negotiated to slightly less frequent cystoscopies but understands guidelines recommend more frequent)  2. Benign prostatic hyperplasia with urinary hesitancy Increased obstructive urinary symptoms with some prostatic regrowth Interested in taking finasteride sent to pharmacy  3. History of kidney stones Will recommend KUB next visit for surveillance of known stones Currently asymptomatic   Return in about 6 months (around 10/09/2019) for cysto.  [name], MD

## 2019-04-29 ENCOUNTER — Other Ambulatory Visit: Payer: Self-pay | Admitting: Urology

## 2019-04-29 DIAGNOSIS — C679 Malignant neoplasm of bladder, unspecified: Secondary | ICD-10-CM

## 2019-07-28 ENCOUNTER — Other Ambulatory Visit: Payer: Self-pay | Admitting: Urology

## 2019-07-28 DIAGNOSIS — C679 Malignant neoplasm of bladder, unspecified: Secondary | ICD-10-CM

## 2019-10-08 NOTE — Progress Notes (Signed)
   10/09/19  CC:  Chief Complaint  Patient presents with  . Cysto    HPI: Cory Hardin is a 74 y.o. M with a history of recurrent nephrolithiasis, bladder stone, recurrent bladder CA, BPH s/p TURP, elevated PSA returns today for a 6 month f/u for a cysto.   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 He has a recurrence and undenwent TURBT in 08/2014, pathology LgTA. 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 S/p BCG x 6 completed 02/2015. Cysto 06/2015 suspicious for recurrent, TURBT c/w granulomatous inflammation/ chronic cystitis.  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  B RTG negative. RUS 02/2018  Nephrolithiasis: He has an extensive history of recurrent stones. He is undergone multiple stone procedures in the past. 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  Most recent intervention s/p R URS, LL on 06/23/15 s/p URS.  Follow-up renal ultrasound from 09/2015 shows no hydronephrosis. Bilateral nonobstructing stones present.Marland Kitchen KUB 01/2016 with 1.6 cm LLP stone, increased from 1.3 6 months ago. Right small stone stable.  KUB 07/2016 stable. New gall stones appreciated.  KUB 01/2017 unchanged. R URS, LL, stent on 01/2018 for asymptomatic right distal ureteral stone incidental on KUB.   No recent flank pain or stone episodes.   BPH: History of BPH, bladder stones S/p TURP 04/2014.   Currently back on finasteride. Voiding well. No urinary complaints today.Unchanged.   Elevated PSA: PSA elevated on 10/10/15 to 4.3, 3.3 7/17, and 2.9 on 10/217. DRE 7/17 unremarkable. Elected discontinuation of PSA screening.  There were no vitals taken for this visit. NED. A&Ox3.   No respiratory distress   Abd soft, NT, ND Normal phallus with bilateral descended testicles  Cystoscopy Procedure Note  Patient  identification was confirmed, informed consent was obtained, and patient was prepped using Betadine solution.  Lidocaine jelly was administered per urethral meatus.    Pre-Procedure: - Inspection reveals a normal caliber ureteral meatus.  Procedure: The flexible cystoscope was introduced without difficulty - No urethral strictures/lesions are present. - Normal prostate with some mild prostatic regrowth, short fossa with TURP defect - Normal bladder neck - Bilateral ureteral orifices identified - Bladder mucosa reveals 2 mm papillary reoccurrence right lateral bladder wall adjacent to stellate scar.  - No bladder stones - Mild trabeculation  Retroflexion shows shows 2 mm papillar reoccurrence.   Post-Procedure: - Patient tolerated the procedure well  Assessment/ Plan:  1. History bladder cancer History Low-grade noninvasive, last recurrent 01/2018 Cysto today reveals 2 mm papillary reoccurrence on right lateral bladder wall adjacent to stellate scar Discussed TURBT with bilateral retrograde and intravesical gemcitabine along with risks and benefits.  Pt is interested and has elected to proceed with treatment.   2. Benign prostatic hyperplasia with urinary hesitancy Currently on finasteride with no complaints   3. History of kidney stones Plan for fluoroscopy to evaluate for stone disease at the time of procedure as above  4. Microscopic hematuria  Urinalysis with microscopic hematuria. Evaluation as above.   Jamas Lav, am acting as a scribe for Dr. [name],  I have reviewed the above documentation for accuracy and completeness, and I agree with the above.   [name], MD

## 2019-10-09 ENCOUNTER — Other Ambulatory Visit: Payer: Self-pay | Admitting: Radiology

## 2019-10-09 ENCOUNTER — Other Ambulatory Visit: Payer: Self-pay

## 2019-10-09 ENCOUNTER — Encounter: Payer: Self-pay | Admitting: Urology

## 2019-10-09 ENCOUNTER — Ambulatory Visit: Payer: Medicare Other | Admitting: Urology

## 2019-10-09 VITALS — BP 178/85 | HR 58 | Ht 71.0 in | Wt 196.0 lb

## 2019-10-09 DIAGNOSIS — C679 Malignant neoplasm of bladder, unspecified: Secondary | ICD-10-CM

## 2019-10-09 DIAGNOSIS — Z01818 Encounter for other preprocedural examination: Secondary | ICD-10-CM

## 2019-10-09 DIAGNOSIS — Z8551 Personal history of malignant neoplasm of bladder: Secondary | ICD-10-CM | POA: Diagnosis not present

## 2019-10-09 MED ORDER — GEMCITABINE CHEMO FOR BLADDER INSTILLATION 2000 MG: 2000.0000 mg | Freq: Once | INTRAVENOUS | Status: DC

## 2019-10-10 LAB — MICROSCOPIC EXAMINATION: Bacteria, UA: NONE SEEN

## 2019-10-10 LAB — URINALYSIS, COMPLETE
Bilirubin, UA: NEGATIVE
Ketones, UA: NEGATIVE
Leukocytes,UA: NEGATIVE
Nitrite, UA: NEGATIVE
Specific Gravity, UA: >1.03 — ABNORMAL HIGH (ref 1.005–1.030)
Urobilinogen, Ur: 0.2 mg/dL (ref 0.2–1.0)
pH, UA: 5 (ref 5.0–7.5)

## 2019-10-13 LAB — CULTURE, URINE COMPREHENSIVE

## 2019-10-15 ENCOUNTER — Telehealth: Payer: Self-pay

## 2019-10-15 NOTE — Telephone Encounter (Signed)
This pt calls and states that he would like to cancel his surgery schedule inpatient and have it changed to the in office option Dr. Erlene Quan gave him. He would like to do this in May if possible.

## 2019-10-16 NOTE — Telephone Encounter (Signed)
We had discussed an option of in office fulguration of the small bladder tumor given his history of noninvasive tumors in the past.  I had more strongly recommend resection/biopsy in the operating room in order to obtain pathology.  We discussed the disadvantages of an office-based procedure..  He seemed to understand this.  It seems that he would like to do in the office regardless.  Please reiterate the above and schedule an office fulguration of his bladder tumor as desired.  [name], MD

## 2019-10-16 NOTE — Telephone Encounter (Signed)
Reiterated recommendation for resection/biopsy in the operating room with patient. He would like to schedule an office fulguration despite this. This was done.

## 2019-10-16 NOTE — Telephone Encounter (Signed)
I would strongly advise against this.  If the tumor continues to enlarge, it may not be possible in the office anymore.  Sooner is better than later.  [name], MD

## 2019-10-16 NOTE — Telephone Encounter (Signed)
He would like to wait until his next 3 month cysto which is scheduled 01/23/2020.Is this appropriate?

## 2019-10-18 ENCOUNTER — Inpatient Hospital Stay: Admission: RE | Admit: 2019-10-18 | Payer: Medicare Other | Source: Ambulatory Visit

## 2019-10-19 ENCOUNTER — Other Ambulatory Visit: Payer: Medicare Other

## 2019-10-25 ENCOUNTER — Other Ambulatory Visit: Payer: Medicare Other

## 2019-10-28 ENCOUNTER — Other Ambulatory Visit: Payer: Self-pay | Admitting: Urology

## 2019-10-28 DIAGNOSIS — C679 Malignant neoplasm of bladder, unspecified: Secondary | ICD-10-CM

## 2019-10-29 ENCOUNTER — Ambulatory Visit: Admit: 2019-10-29 | Payer: Medicare Other | Admitting: Urology

## 2019-10-29 SURGERY — TRANSURETHRAL RESECTION OF BLADDER TUMOR WITH MITOMYCIN-C
Anesthesia: General

## 2019-11-05 NOTE — Progress Notes (Signed)
   11/06/19  CC:  Chief Complaint  Patient presents with  . Cysto fulgeration    HPI: Cory Hardin is a 74 y.o. M who presents today for a cysto w/ fulgeration.   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 He has a recurrence and undenwent TURBT in 08/2014, pathology LgTA. 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 S/p BCG x 6 completed 02/2015. Cysto 06/2015 suspicious for recurrent, TURBT c/w granulomatous inflammation/ chronic cystitis.  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 B RTG negative. RUS 02/2018  He was originally scheduled for TURBT on 10/29/19 which he cancelled due to preference of cysto w/ in-office fulgeration.   Nephrolithiasis: He has an extensive history of recurrent stones. He is undergone multiple stone procedures in the past. 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  Most recent intervention s/p R URS, LL on 06/23/15 s/p URS.  Follow-up renal ultrasound from 09/2015 shows no hydronephrosis. Bilateral nonobstructing stones present.Marland Kitchen KUB 01/2016 with 1.6 cm LLP stone, increased from 1.3 6 months ago. Right small stone stable.  KUB 07/2016 stable. New gall stones appreciated.  KUB 01/2017 unchanged. R URS, LL, stent on 01/2018 for asymptomatic right distal ureteral stone incidental on KUB.   No recent flank pain or stone episodes.  Elevated PSA: PSA elevated on 10/10/15 to 4.3, 3.3 7/17, and 2.9 on 10/217. DRE 7/17 unremarkable. Elected discontinuation of PSA screening.  BPH: History of BPH, bladder stones S/p TURP 04/2014.   Currently back on finasteride. Voiding well. No urinary complaints today.Unchanged.   There were no vitals taken for this visit. NED. A&Ox3.   No respiratory distress   Abd soft, NT, ND Normal phallus with bilateral descended testicles   Cystoscopy/Fulgeration Procedure Note  Patient identification was confirmed, informed consent was obtained, and patient was prepped using Betadine solution.  Lidocaine jelly was administered per urethral meatus.     Pre-Procedure: - Inspection reveals a normal caliber ureteral meatus.  Procedure: -Instillation of 60 mL 2% Lidocaine. See MA note for details.   The flexible cystoscope was introduced without difficulty - No urethral strictures/lesions are present. - Normal prostate with some mild prostatic regrowth, short fossa with TURP defect - Normal bladder neck - Bilateral ureteral orifices identified - Bladder mucosa reveals 2 mm papillary reoccurrence right lateral bladder wall adjacent to stellate scar.  - No bladder stones - Mild trabeculation  Retroflexion shows shows 2 mm papillar reoccurrence.   -Small area <0.5 cm identified and fulgerated w/ no remaining papillary tissue. Consistent w/ low grade reoccurrence.  Post-Procedure: - Patient tolerated the procedure well  Assessment/ Plan:  1. Hx of bladder cancer  Cysto w/ fulgeration w/ no complications  Return in 3 months for cysto to reaccess    I, Lucas Mallow, am acting as a scribe for Dr. [name],  I have reviewed the above documentation for accuracy and completeness, and I agree with the above.   [name], MD

## 2019-11-06 ENCOUNTER — Ambulatory Visit: Payer: Medicare Other | Admitting: Urology

## 2019-11-06 ENCOUNTER — Encounter: Payer: Self-pay | Admitting: Urology

## 2019-11-06 ENCOUNTER — Other Ambulatory Visit: Payer: Self-pay

## 2019-11-06 VITALS — BP 168/92 | HR 57 | Ht 73.0 in | Wt 185.0 lb

## 2019-11-06 DIAGNOSIS — Z8551 Personal history of malignant neoplasm of bladder: Secondary | ICD-10-CM

## 2019-11-06 DIAGNOSIS — C679 Malignant neoplasm of bladder, unspecified: Secondary | ICD-10-CM

## 2019-11-06 LAB — MICROSCOPIC EXAMINATION
Bacteria, UA: NONE SEEN
WBC, UA: NONE SEEN /hpf (ref 0–5)

## 2019-11-06 LAB — URINALYSIS, COMPLETE
Bilirubin, UA: NEGATIVE
Ketones, UA: NEGATIVE
Leukocytes,UA: NEGATIVE
Nitrite, UA: NEGATIVE
Protein,UA: NEGATIVE
Specific Gravity, UA: 1.025 (ref 1.005–1.030)
Urobilinogen, Ur: 0.2 mg/dL (ref 0.2–1.0)
pH, UA: 5 (ref 5.0–7.5)

## 2019-11-06 MED ORDER — LIDOCAINE HCL 2 % IJ SOLN
60.0000 mL | Freq: Once | INTRAMUSCULAR | Status: AC
  Administered 2019-11-06: 1200 mg

## 2019-11-06 MED ORDER — HYDROCHLOROTHIAZIDE 25 MG PO TABS: 25.0000 mg | ORAL_TABLET | Freq: Every day | ORAL | 3 refills | Status: DC

## 2019-11-06 NOTE — Progress Notes (Signed)
Bladder Instillation  Due to fulgeration patient is present today for a Bladder Instillation of 68ml 2% Lidocaine. Patient was cleaned and prepped in a sterile fashion with betadine and lidocaine 2% jelly was instilled into the urethra.  A 14FR catheter was inserted, urine return was noted 28ml, urine was yellow in color.  60 ml was instilled into the bladder. The catheter was then removed. Patient tolerated well, no complications were noted Patient held in bladder for 30 minutes prior to procedure starting.   Performed by: Verlene Mayer, [place]

## 2020-01-23 ENCOUNTER — Other Ambulatory Visit: Payer: Medicare Other | Admitting: Urology

## 2020-02-05 NOTE — H&P (View-Only) (Signed)
° °  02/06/2020  CC:  Chief Complaint  Patient presents with   Cysto    HPI: Cory Hardin is a 74 y.o. male with a history of bladder cancer, BPH, elevated PSA and nephrolithiasis who returns fr a cysto.   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 He has a recurrence and undenwent TURBT in 08/2014, pathology LgTA. 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 S/p BCG x 6 completed 02/2015. Cysto 06/2015 suspicious for recurrent, TURBT c/w granulomatous inflammation/ chronic cystitis.  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 B RTG negative. RUS 02/2018  He was originally scheduled for TURBT on 10/29/19 which he cancelled due to preference of cysto w/ in-office fulguration for small low grade appearing recurrence.     Nephrolithiasis: He has an extensive history of recurrent stones. He is undergone multiple stone procedures in the past. 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  Most recent intervention s/p R URS, LL on 06/23/15 s/p URS.  Follow-up renal ultrasound from 09/2015 shows no hydronephrosis. Bilateral nonobstructing stones present.Marland Kitchen KUB 01/2016 with 1.6 cm LLP stone, increased from 1.3 6 months ago. Right small stone stable.  KUB 07/2016 stable. New gall stones appreciated.  KUB 01/2017 unchanged. R URS, LL, stent on 01/2018 for asymptomatic right distal ureteral stone incidental on KUB.   Believes he passed a small stone since last visit.  No current flank pain.  Elevated PSA: PSA elevated on 10/10/15 to 4.3, 3.3 7/17, and 2.9 on 10/217. DRE 7/17 unremarkable. Elected discontinuation of PSA screening.  BPH: History of BPH, bladder stones S/p TURP 04/2014.    Blood pressure (!) 161/101, pulse 64. NED. A&Ox3.   No respiratory distress   Abd soft, NT, ND Normal phallus with bilateral  descended testicles  Cystoscopy Procedure Note  Patient identification was confirmed, informed consent was obtained, and patient was prepped using Betadine solution.  Lidocaine jelly was administered per urethral meatus.     Pre-Procedure: - Inspection reveals a normal caliber ureteral meatus.  Procedure: The flexible cystoscope was introduced without difficulty - No urethral strictures/lesions are present. - Normal prostate with some mild prostatic regrowth, short fossa with TURP defect - Normal bladder neck - Bilateral ureteral orifices identified - Mild trabeculation  -There was an area of adherent necrosis at the right lateral wall measuring 1 cm consistent with incomplete fulgeration which is on a pink viable base.  There is some surrounding the low-grade appearing papillary change which has a carpet-like appearance measuring no greater than 1 cm in total diameter.  Post-Procedure: - Patient tolerated the procedure well  Assessment/ Plan:  1. History of bladder cancer/bladder tumor right lateral bladder wall Cystoscopy reveals persistent/ recurrence disease  Recommend TURBT, bilateral retrograde pyelogram with intravesical gemcitabine Reviewed risk including risk of bleeding, infection, bladder irritation amongst others. He was reluctant but ultimately agreeable. Patient agreed. -Urine culture sent   I, Selena Batten, am acting as a scribe for Dr. [name].  I have reviewed the above documentation for accuracy and completeness, and I agree with the above.   [name], MD

## 2020-02-05 NOTE — Progress Notes (Signed)
   02/06/2020  CC:  Chief Complaint  Patient presents with  . Cysto    HPI: Cory Hardin is a 74 y.o. male with a history of bladder cancer, BPH, elevated PSA and nephrolithiasis who returns fr a cysto.   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 He has a recurrence and undenwent TURBT in 08/2014, pathology LgTA. 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 S/p BCG x 6 completed 02/2015. Cysto 06/2015 suspicious for recurrent, TURBT c/w granulomatous inflammation/ chronic cystitis.  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 B RTG negative. RUS 02/2018  He was originally scheduled for TURBT on 10/29/19 which he cancelled due to preference of cysto w/ in-office fulguration for small low grade appearing recurrence.     Nephrolithiasis: He has an extensive history of recurrent stones. He is undergone multiple stone procedures in the past. 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  Most recent intervention s/p R URS, LL on 06/23/15 s/p URS.  Follow-up renal ultrasound from 09/2015 shows no hydronephrosis. Bilateral nonobstructing stones present.Marland Kitchen KUB 01/2016 with 1.6 cm LLP stone, increased from 1.3 6 months ago. Right small stone stable.  KUB 07/2016 stable. New gall stones appreciated.  KUB 01/2017 unchanged. R URS, LL, stent on 01/2018 for asymptomatic right distal ureteral stone incidental on KUB.   Believes he passed a small stone since last visit.  No current flank pain.  Elevated PSA: PSA elevated on 10/10/15 to 4.3, 3.3 7/17, and 2.9 on 10/217. DRE 7/17 unremarkable. Elected discontinuation of PSA screening.  BPH: History of BPH, bladder stones S/p TURP 04/2014.    Blood pressure (!) 161/101, pulse 64. NED. A&Ox3.   No respiratory distress   Abd soft, NT, ND Normal phallus with bilateral  descended testicles  Cystoscopy Procedure Note  Patient identification was confirmed, informed consent was obtained, and patient was prepped using Betadine solution.  Lidocaine jelly was administered per urethral meatus.     Pre-Procedure: - Inspection reveals a normal caliber ureteral meatus.  Procedure: The flexible cystoscope was introduced without difficulty - No urethral strictures/lesions are present. - Normal prostate with some mild prostatic regrowth, short fossa with TURP defect - Normal bladder neck - Bilateral ureteral orifices identified - Mild trabeculation  -There was an area of adherent necrosis at the right lateral wall measuring 1 cm consistent with incomplete fulgeration which is on a pink viable base.  There is some surrounding the low-grade appearing papillary change which has a carpet-like appearance measuring no greater than 1 cm in total diameter.  Post-Procedure: - Patient tolerated the procedure well  Assessment/ Plan:  1. History of bladder cancer/bladder tumor right lateral bladder wall Cystoscopy reveals persistent/ recurrence disease  Recommend TURBT, bilateral retrograde pyelogram with intravesical gemcitabine Reviewed risk including risk of bleeding, infection, bladder irritation amongst others. He was reluctant but ultimately agreeable. Patient agreed. -Urine culture sent   I, Selena Batten, am acting as a scribe for Dr. [name].  I have reviewed the above documentation for accuracy and completeness, and I agree with the above.   [name], MD

## 2020-02-06 ENCOUNTER — Other Ambulatory Visit: Payer: Self-pay

## 2020-02-06 ENCOUNTER — Other Ambulatory Visit: Payer: Self-pay | Admitting: Radiology

## 2020-02-06 ENCOUNTER — Encounter: Payer: Self-pay | Admitting: Urology

## 2020-02-06 ENCOUNTER — Ambulatory Visit (INDEPENDENT_AMBULATORY_CARE_PROVIDER_SITE_OTHER): Payer: Medicare Other | Admitting: Urology

## 2020-02-06 VITALS — BP 161/101 | HR 64

## 2020-02-06 DIAGNOSIS — C679 Malignant neoplasm of bladder, unspecified: Secondary | ICD-10-CM

## 2020-02-06 DIAGNOSIS — Z8551 Personal history of malignant neoplasm of bladder: Secondary | ICD-10-CM

## 2020-02-06 MED ORDER — GEMCITABINE CHEMO FOR BLADDER INSTILLATION 2000 MG
2000.0000 mg | Freq: Once | INTRAVENOUS | Status: AC
Start: 2020-02-06 — End: ?

## 2020-02-08 LAB — URINALYSIS, COMPLETE
Bilirubin, UA: NEGATIVE
Glucose, UA: NEGATIVE
Ketones, UA: NEGATIVE
Leukocytes,UA: NEGATIVE
Nitrite, UA: NEGATIVE
Protein,UA: NEGATIVE
Specific Gravity, UA: 1.025 (ref 1.005–1.030)
Urobilinogen, Ur: 0.2 mg/dL (ref 0.2–1.0)
pH, UA: 5.5 (ref 5.0–7.5)

## 2020-02-08 LAB — MICROSCOPIC EXAMINATION: Bacteria, UA: NONE SEEN

## 2020-02-10 LAB — CULTURE, URINE COMPREHENSIVE

## 2020-02-13 ENCOUNTER — Encounter
Admission: RE | Admit: 2020-02-13 | Discharge: 2020-02-13 | Disposition: A | Discharge status: Home / Self Care | Payer: Medicare Other | Source: Ambulatory Visit | Attending: Urology | Admitting: Urology

## 2020-02-13 HISTORY — DX: Other pulmonary embolism without acute cor pulmonale: I26.99

## 2020-02-13 HISTORY — DX: Personal history of urinary calculi: Z87.442

## 2020-02-13 NOTE — Patient Instructions (Signed)
Your procedure is scheduled on: 02/18/20 Report to [place]. To find out your arrival time please call [phone] between 1PM - 3PM on 02/17/20.  Remember: Instructions that are not followed completely may result in serious medical risk, up to and including death, or upon the discretion of your surgeon and anesthesiologist your surgery may need to be rescheduled.     _X__ 1. Do not eat food after midnight the night before your procedure.                 No gum chewing or hard candies. You may drink clear liquids up to 2 hours                 before you are scheduled to arrive for your surgery- DO not drink clear                 liquids within 2 hours of the start of your surgery.                 Clear Liquids include:  water, apple juice without pulp, clear carbohydrate                 drink such as Clearfast or Gatorade, Black Coffee or Tea (Do not add                 anything to coffee or tea). Diabetics water only  __X__2.  On the morning of surgery brush your teeth with toothpaste and water, you                 may rinse your mouth with mouthwash if you wish.  Do not swallow any              toothpaste of mouthwash.     _X__ 3.  No Alcohol for 24 hours before or after surgery.   _X__ 4.  Do Not Smoke or use e-cigarettes For 24 Hours Prior to Your Surgery.                 Do not use any chewable tobacco products for at least 6 hours prior to                 surgery.  ____  5.  Bring all medications with you on the day of surgery if instructed.   __X__  6.  Notify your doctor if there is any change in your medical condition      (cold, fever, infections).     Do not wear jewelry, make-up, hairpins, clips or nail polish. Do not wear lotions, powders, or perfumes.  Do not shave 48 hours prior to surgery. Men may shave face and neck. Do not bring valuables to the hospital.    [facility] is not responsible for any belongings or  valuables.  Contacts, dentures/partials or body piercings may not be worn into surgery. Bring a case for your contacts, glasses or hearing aids, a denture cup will be supplied. Leave your suitcase in the car. After surgery it may be brought to your room. For patients admitted to the hospital, discharge time is determined by your treatment team.   Patients discharged the day of surgery will not be allowed to drive home.   Please read over the following fact sheets that you were given:   MRSA Information  __X__ Take these medicines the morning of surgery with A SIP OF WATER:  1. carvedilol (COREG) 6.25 MG tablet  2. fenofibrate (TRICOR) 145 MG tablet  3. finasteride (PROSCAR) 5 MG tablet  4. omeprazole (PRILOSEC) 20 MG capsule  5.  6.  ____ Fleet Enema (as directed)   ____ Use CHG Soap/SAGE wipes as directed  ____ Use inhalers on the day of surgery  ____ Stop metformin/Janumet/Farxiga 2 days prior to surgery    ____ Take 1/2 of usual insulin dose the night before surgery. No insulin the morning          of surgery.   __X__ Stop Blood Thinners Coumadin/Plavix/Xarelto/Pleta/Pradaxa/Eliquis/Effient/Aspirin  on   Or contact your Surgeon, Cardiologist or Medical Doctor regarding  ability to stop your blood thinners  __X__ Stop Anti-inflammatories 7 days before surgery such as Advil, Ibuprofen, Motrin,  BC or Goodies Powder, Naprosyn, Naproxen, Aleve, Aspirin    __X__ Stop all herbal supplements, fish oil or vitamin E until after surgery.    ____ Bring C-Pap to the hospital.       Indwelling Urinary Catheter Care, Adult An indwelling urinary catheter is a thin, flexible, germ-free (sterile) tube that is placed into the bladder to help drain urine out of the body. The catheter is inserted into the part of the body that drains urine from the bladder (urethra). Urine drains from the catheter into a drainage bag outside of the body. Taking good care of your catheter will keep it  working properly and help to prevent problems from developing. What are the risks?  Bacteria may get into your bladder and cause a urinary tract infection.  Urine flow can become blocked. This can happen if the catheter is not working correctly, or if you have sediment or a blood clot in your bladder or the catheter.  Tissue near the catheter may become irritated and bleed. How to wear your catheter and your drainage bag Supplies needed  Adhesive tape or a leg strap.  Alcohol wipe or soap and water (if you use tape).  A clean towel (if you use tape).  Overnight drainage bag.  Smaller drainage bag (leg bag). Wearing your catheter and bag Use adhesive tape or a leg strap to attach your catheter to your leg.  Make sure the catheter is not pulled tight.  If a leg strap gets wet, replace it with a dry one.  If you use adhesive tape: 1. Use an alcohol wipe or soap and water to wash off any stickiness on your skin where you had tape before. 2. Use a clean towel to pat-dry the area. 3. Apply the new tape. You should have received a large overnight drainage bag and a smaller leg bag that fits underneath clothing.  You may wear the overnight bag at any time, but you should not wear the leg bag at night.  Always wear the leg bag below your knee.  Make sure the overnight drainage bag is always lower than the level of your bladder, but do not let it touch the floor. Before you go to sleep, hang the bag inside a wastebasket that is covered by a clean plastic bag. How to care for your skin around the catheter     Supplies needed  A clean washcloth.  Water and mild soap.  A clean towel. Caring for your skin and catheter  Every day, use a clean washcloth and soapy water to clean the skin around your catheter. 1. Wash your hands with soap and water. 2. Wet a washcloth in warm water and mild soap. 3.  Clean the skin around your urethra.  If you are male:  Use one hand to  gently spread the folds of skin around your vagina (labia).  With the washcloth in your other hand, wipe the inner side of your labia on each side. Do this in a front-to-back direction.  If you are male:  Use one hand to pull back any skin that covers the end of your penis (foreskin).  With the washcloth in your other hand, wipe your penis in small circles. Start wiping at the tip of your penis, then move outward from the catheter.  Move the foreskin back in place, if this applies. 4. With your free hand, hold the catheter close to where it enters your body. Keep holding the catheter during cleaning so it does not get pulled out. 5. Use your other hand to clean the catheter with the washcloth.  Only wipe downward on the catheter.  Do not wipe upward toward your body, because that may push bacteria into your urethra and cause infection. 6. Use a clean towel to pat-dry the catheter and the skin around it. Make sure to wipe off all soap. 7. Wash your hands with soap and water.  Shower every day. Do not take baths.  Do not use cream, ointment, or lotion on the area where the catheter enters your body, unless your health care provider tells you to do that.  Do not use powders, sprays, or lotions on your genital area.  Check your skin around the catheter every day for signs of infection. Check for: ? Redness, swelling, or pain. ? Fluid or blood. ? Warmth. ? Pus or a bad smell. How to empty the drainage bag Supplies needed  Rubbing alcohol.  Gauze pad or cotton ball.  Adhesive tape or a leg strap. Emptying the bag Empty your drainage bag (your overnight drainage bag or your leg bag) when it is ?- full, or at least 2-3 times a day. Clean the drainage bag according to the manufacturer's instructions or as told by your health care provider. 1. Wash your hands with soap and water. 2. Detach the drainage bag from your leg. 3. Hold the drainage bag over the toilet or a clean container.  Make sure the drainage bag is lower than your hips and bladder. This stops urine from going back into the tubing and into your bladder. 4. Open the pour spout at the bottom of the bag. 5. Empty the urine into the toilet or container. Do not let the pour spout touch any surface. This precaution is important to prevent bacteria from getting in the bag and causing infection. 6. Apply rubbing alcohol to a gauze pad or cotton ball. 7. Use the gauze pad or cotton ball to clean the pour spout. 8. Close the pour spout. 9. Attach the bag to your leg with adhesive tape or a leg strap. 10. Wash your hands with soap and water. How to change the drainage bag Supplies needed:  Alcohol wipes.  A clean drainage bag.  Adhesive tape or a leg strap. Changing the bag Replace your drainage bag with a clean bag if it leaks, starts to smell bad, or looks dirty. 1. Wash your hands with soap and water. 2. Detach the dirty drainage bag from your leg. 3. Pinch the catheter with your fingers so that urine does not spill out. 4. Disconnect the catheter tube from the drainage tube at the connection valve. Do not let the tubes touch any surface. 5. Clean  the end of the catheter tube with an alcohol wipe. Use a different alcohol wipe to clean the end of the drainage tube. 6. Connect the catheter tube to the drainage tube of the clean bag. 7. Attach the clean bag to your leg with adhesive tape or a leg strap. Avoid attaching the new bag too tightly. 8. Wash your hands with soap and water. General instructions   Never pull on your catheter or try to remove it. Pulling can damage your internal tissues.  Always wash your hands before and after you handle your catheter or drainage bag. Use a mild, fragrance-free soap. If soap and water are not available, use hand sanitizer.  Always make sure there are no twists or bends (kinks) in the catheter tube.  Always make sure there are no leaks in the catheter or drainage  bag.  Drink enough fluid to keep your urine pale yellow.  Do not take baths, swim, or use a hot tub.  If you are male, wipe from front to back after having a bowel movement. Contact a health care provider if:  Your urine is cloudy.  Your urine smells unusually bad.  Your catheter gets clogged.  Your catheter starts to leak.  Your bladder feels full. Get help right away if:  You have redness, swelling, or pain where the catheter enters your body.  You have fluid, blood, pus, or a bad smell coming from the area where the catheter enters your body.  The area where the catheter enters your body feels warm to the touch.  You have a fever.  You have pain in your abdomen, legs, lower back, or bladder.  You see blood in the catheter.  Your urine is pink or red.  You have nausea, vomiting, or chills.  Your urine is not draining into the bag.  Your catheter gets pulled out. Summary  An indwelling urinary catheter is a thin, flexible, germ-free (sterile) tube that is placed into the bladder to help drain urine out of the body.  The catheter is inserted into the part of the body that drains urine from the bladder (urethra).  Take good care of your catheter to keep it working properly and help prevent problems from developing.  Always wash your hands before and after you handle your catheter or drainage bag.  Never pull on your catheter or try to remove it. This information is not intended to replace advice given to you by your health care provider. Make sure you discuss any questions you have with your health care provider. Document Revised: 10/13/2018 Document Reviewed: 02/04/2017 Elsevier Patient Education  [place].

## 2020-02-14 ENCOUNTER — Encounter: Payer: Self-pay | Admitting: Urgent Care

## 2020-02-14 ENCOUNTER — Other Ambulatory Visit: Admission: RE | Admit: 2020-02-14 | Payer: Medicare Other | Source: Ambulatory Visit

## 2020-02-14 ENCOUNTER — Other Ambulatory Visit: Payer: Self-pay

## 2020-02-14 ENCOUNTER — Other Ambulatory Visit
Admission: RE | Admit: 2020-02-14 | Discharge: 2020-02-14 | Disposition: A | Discharge status: Home / Self Care | Payer: Medicare Other | Source: Ambulatory Visit | Attending: Urology | Admitting: Urology

## 2020-02-14 DIAGNOSIS — Z01818 Encounter for other preprocedural examination: Secondary | ICD-10-CM | POA: Diagnosis present

## 2020-02-14 DIAGNOSIS — E119 Type 2 diabetes mellitus without complications: Secondary | ICD-10-CM | POA: Diagnosis not present

## 2020-02-14 DIAGNOSIS — Z20822 Contact with and (suspected) exposure to covid-19: Secondary | ICD-10-CM | POA: Diagnosis not present

## 2020-02-14 DIAGNOSIS — I10 Essential (primary) hypertension: Secondary | ICD-10-CM | POA: Diagnosis not present

## 2020-02-14 LAB — SARS CORONAVIRUS 2 (TAT 6-24 HRS): SARS Coronavirus 2: NEGATIVE

## 2020-02-18 ENCOUNTER — Encounter: Payer: Self-pay | Admitting: Urology

## 2020-02-18 ENCOUNTER — Other Ambulatory Visit: Payer: Self-pay

## 2020-02-18 ENCOUNTER — Ambulatory Visit: Payer: Medicare Other | Admitting: Anesthesiology

## 2020-02-18 ENCOUNTER — Encounter
Admission: RE | Disposition: A | Discharge status: Home / Self Care | Payer: Self-pay | Source: Home / Self Care | Attending: Urology

## 2020-02-18 ENCOUNTER — Ambulatory Visit
Admission: RE | Admit: 2020-02-18 | Discharge: 2020-02-18 | Disposition: A | Discharge status: Home / Self Care | Payer: Medicare Other | Attending: Urology | Admitting: Urology

## 2020-02-18 ENCOUNTER — Ambulatory Visit: Payer: Medicare Other

## 2020-02-18 DIAGNOSIS — Z8551 Personal history of malignant neoplasm of bladder: Secondary | ICD-10-CM | POA: Diagnosis not present

## 2020-02-18 DIAGNOSIS — I10 Essential (primary) hypertension: Secondary | ICD-10-CM | POA: Diagnosis not present

## 2020-02-18 DIAGNOSIS — D414 Neoplasm of uncertain behavior of bladder: Secondary | ICD-10-CM | POA: Insufficient documentation

## 2020-02-18 DIAGNOSIS — N400 Enlarged prostate without lower urinary tract symptoms: Secondary | ICD-10-CM | POA: Insufficient documentation

## 2020-02-18 DIAGNOSIS — E119 Type 2 diabetes mellitus without complications: Secondary | ICD-10-CM | POA: Diagnosis not present

## 2020-02-18 DIAGNOSIS — R972 Elevated prostate specific antigen [PSA]: Secondary | ICD-10-CM | POA: Diagnosis not present

## 2020-02-18 DIAGNOSIS — D494 Neoplasm of unspecified behavior of bladder: Secondary | ICD-10-CM | POA: Diagnosis not present

## 2020-02-18 DIAGNOSIS — C679 Malignant neoplasm of bladder, unspecified: Secondary | ICD-10-CM

## 2020-02-18 DIAGNOSIS — N329 Bladder disorder, unspecified: Secondary | ICD-10-CM | POA: Diagnosis present

## 2020-02-18 HISTORY — PX: CYSTOSCOPY W/ RETROGRADES: SHX1426

## 2020-02-18 HISTORY — PX: TRANSURETHRAL RESECTION OF BLADDER TUMOR WITH MITOMYCIN-C: SHX6459

## 2020-02-18 LAB — GLUCOSE, CAPILLARY
Glucose-Capillary: 131 mg/dL — ABNORMAL HIGH (ref 70–99)
Glucose-Capillary: 131 mg/dL — ABNORMAL HIGH (ref 70–99)

## 2020-02-18 SURGERY — TRANSURETHRAL RESECTION OF BLADDER TUMOR WITH MITOMYCIN-C
Anesthesia: General

## 2020-02-18 MED ORDER — GLYCOPYRROLATE 0.2 MG/ML IJ SOLN
INTRAMUSCULAR | Status: AC
  Filled 2020-02-18: qty 1

## 2020-02-18 MED ORDER — GLYCOPYRROLATE 0.2 MG/ML IJ SOLN
INTRAMUSCULAR | Status: DC | PRN
  Administered 2020-02-18: .2 mg via INTRAVENOUS

## 2020-02-18 MED ORDER — MIDAZOLAM HCL 2 MG/2ML IJ SOLN
INTRAMUSCULAR | Status: AC
  Filled 2020-02-18: qty 2

## 2020-02-18 MED ORDER — MIDAZOLAM HCL 2 MG/2ML IJ SOLN
INTRAMUSCULAR | Status: DC | PRN
  Administered 2020-02-18: 2 mg via INTRAVENOUS

## 2020-02-18 MED ORDER — LIDOCAINE HCL (PF) 2 % IJ SOLN
INTRAMUSCULAR | Status: AC
  Filled 2020-02-18: qty 5

## 2020-02-18 MED ORDER — IOHEXOL 180 MG/ML  SOLN
INTRAMUSCULAR | Status: DC | PRN
  Administered 2020-02-18: 20 mL

## 2020-02-18 MED ORDER — PROPOFOL 10 MG/ML IV BOLUS
INTRAVENOUS | Status: AC
  Filled 2020-02-18: qty 20

## 2020-02-18 MED ORDER — DEXAMETHASONE SODIUM PHOSPHATE 10 MG/ML IJ SOLN
INTRAMUSCULAR | Status: DC | PRN
  Administered 2020-02-18: 6 mg via INTRAVENOUS

## 2020-02-18 MED ORDER — FENTANYL CITRATE (PF) 100 MCG/2ML IJ SOLN
INTRAMUSCULAR | Status: DC | PRN
  Administered 2020-02-18 (×2): 25 ug via INTRAVENOUS

## 2020-02-18 MED ORDER — CEFAZOLIN SODIUM-DEXTROSE 2-4 GM/100ML-% IV SOLN
INTRAVENOUS | Status: AC
  Filled 2020-02-18: qty 100

## 2020-02-18 MED ORDER — LIDOCAINE HCL (CARDIAC) PF 100 MG/5ML IV SOSY
PREFILLED_SYRINGE | INTRAVENOUS | Status: DC | PRN
  Administered 2020-02-18: 80 mg via INTRAVENOUS

## 2020-02-18 MED ORDER — ONDANSETRON HCL 4 MG/2ML IJ SOLN
INTRAMUSCULAR | Status: DC | PRN
  Administered 2020-02-18: 4 mg via INTRAVENOUS

## 2020-02-18 MED ORDER — FENTANYL CITRATE (PF) 100 MCG/2ML IJ SOLN
INTRAMUSCULAR | Status: AC
  Filled 2020-02-18: qty 2

## 2020-02-18 MED ORDER — DEXAMETHASONE SODIUM PHOSPHATE 10 MG/ML IJ SOLN
INTRAMUSCULAR | Status: AC
  Filled 2020-02-18: qty 1

## 2020-02-18 MED ORDER — GEMCITABINE CHEMO FOR BLADDER INSTILLATION 2000 MG
INTRAVENOUS | Status: DC | PRN
  Administered 2020-02-18: 2000 mg via INTRAVESICAL

## 2020-02-18 MED ORDER — ORAL CARE MOUTH RINSE: 15.0000 mL | Freq: Once | OROMUCOSAL | Status: AC

## 2020-02-18 MED ORDER — ONDANSETRON HCL 4 MG/2ML IJ SOLN
INTRAMUSCULAR | Status: AC
  Filled 2020-02-18: qty 2

## 2020-02-18 MED ORDER — CEFAZOLIN SODIUM-DEXTROSE 2-4 GM/100ML-% IV SOLN
2.0000 g | INTRAVENOUS | Status: AC
  Administered 2020-02-18: 2 g via INTRAVENOUS

## 2020-02-18 MED ORDER — CHLORHEXIDINE GLUCONATE 0.12 % MT SOLN: 15.0000 mL | Freq: Once | OROMUCOSAL | Status: AC

## 2020-02-18 MED ORDER — FENTANYL CITRATE (PF) 100 MCG/2ML IJ SOLN: 25.0000 ug | INTRAMUSCULAR | Status: DC | PRN

## 2020-02-18 MED ORDER — PROPOFOL 10 MG/ML IV BOLUS
INTRAVENOUS | Status: DC | PRN
  Administered 2020-02-18: 150 mg via INTRAVENOUS

## 2020-02-18 MED ORDER — SODIUM CHLORIDE 0.9 % IV SOLN: INTRAVENOUS | Status: DC

## 2020-02-18 MED ORDER — CHLORHEXIDINE GLUCONATE 0.12 % MT SOLN
OROMUCOSAL | Status: AC
  Administered 2020-02-18: 15 mL via OROMUCOSAL
  Filled 2020-02-18: qty 15

## 2020-02-18 MED ORDER — ONDANSETRON HCL 4 MG/2ML IJ SOLN: 4.0000 mg | Freq: Once | INTRAMUSCULAR | Status: DC | PRN

## 2020-02-18 SURGICAL SUPPLY — 36 items
BAG DRAIN CYSTO-URO LG1000N (MISCELLANEOUS) ×4 IMPLANT
BAG DRN RND TRDRP ANRFLXCHMBR (UROLOGICAL SUPPLIES) ×2
BAG URINE DRAIN 2000ML AR STRL (UROLOGICAL SUPPLIES) ×4 IMPLANT
BRUSH SCRUB EZ  4% CHG (MISCELLANEOUS) ×2
BRUSH SCRUB EZ 1% IODOPHOR (MISCELLANEOUS) ×4 IMPLANT
BRUSH SCRUB EZ 4% CHG (MISCELLANEOUS) ×2 IMPLANT
CATH FOLEY 2WAY  5CC 16FR (CATHETERS) ×2
CATH FOLEY 2WAY 5CC 16FR (CATHETERS) ×2
CATH URETL 5X70 OPEN END (CATHETERS) ×4 IMPLANT
CATH URTH 16FR FL 2W BLN LF (CATHETERS) ×2 IMPLANT
DRAPE UTILITY 15X26 TOWEL STRL (DRAPES) ×4 IMPLANT
DRSG TELFA 4X3 1S NADH ST (GAUZE/BANDAGES/DRESSINGS) ×4 IMPLANT
ELECT LOOP 22F BIPOLAR SML (ELECTROSURGICAL)
ELECT REM PT RETURN 9FT ADLT (ELECTROSURGICAL) ×4
ELECTRODE LOOP 22F BIPOLAR SML (ELECTROSURGICAL) IMPLANT
ELECTRODE REM PT RTRN 9FT ADLT (ELECTROSURGICAL) ×2 IMPLANT
GLOVE BIO SURGEON STRL SZ 6.5 (GLOVE) ×3 IMPLANT
GLOVE BIO SURGEONS STRL SZ 6.5 (GLOVE) ×1
GOWN STRL REUS W/ TWL LRG LVL3 (GOWN DISPOSABLE) ×4 IMPLANT
GOWN STRL REUS W/TWL LRG LVL3 (GOWN DISPOSABLE) ×8
GUIDEWIRE STR DUAL SENSOR (WIRE) ×4 IMPLANT
KIT TURNOVER CYSTO (KITS) ×4 IMPLANT
LOOP CUT BIPOLAR 24F LRG (ELECTROSURGICAL) IMPLANT
NDL SAFETY ECLIPSE 18X1.5 (NEEDLE) ×2 IMPLANT
NEEDLE HYPO 18GX1.5 SHARP (NEEDLE) ×4
PACK CYSTO AR (MISCELLANEOUS) ×4 IMPLANT
PAD ARMBOARD 7.5X6 YLW CONV (MISCELLANEOUS) ×4 IMPLANT
SET CYSTO W/LG BORE CLAMP LF (SET/KITS/TRAYS/PACK) ×4 IMPLANT
SET IRRIG Y TYPE TUR BLADDER L (SET/KITS/TRAYS/PACK) ×4 IMPLANT
SOL .9 NS 3000ML IRR  AL (IV SOLUTION) ×2
SOL .9 NS 3000ML IRR AL (IV SOLUTION) ×2
SOL .9 NS 3000ML IRR UROMATIC (IV SOLUTION) ×2 IMPLANT
SURGILUBE 2OZ TUBE FLIPTOP (MISCELLANEOUS) ×4 IMPLANT
SYR 30ML LL (SYRINGE) ×4 IMPLANT
SYRINGE IRR TOOMEY STRL 70CC (SYRINGE) ×4 IMPLANT
WATER STERILE IRR 1000ML POUR (IV SOLUTION) ×4 IMPLANT

## 2020-02-18 NOTE — Transfer of Care (Signed)
Immediate Anesthesia Transfer of Care Note  Patient: Cory Hardin  Procedure(s) Performed: TRANSURETHRAL RESECTION OF BLADDER TUMOR WITH gemcitabine (N/A ) CYSTOSCOPY WITH RETROGRADE PYELOGRAM (Bilateral )  Patient Location: PACU  Anesthesia Type:General  Level of Consciousness: drowsy  Airway & Oxygen Therapy: Patient Spontanous Breathing and Patient connected to face mask oxygen  Post-op Assessment: Report given to RN and Post -op Vital signs reviewed and stable  Post vital signs: Reviewed and stable  Last Vitals:  Vitals Value Taken Time  BP 112/63 02/18/20 1108  Temp 97.75F   Pulse 58 02/18/20 1109  Resp 9 02/18/20 1109  SpO2 91 % 02/18/20 1109  Vitals shown include unvalidated device data.  Last Pain:  Vitals:   02/18/20 0917  TempSrc: Temporal  PainSc: 0-No pain         Complications: No complications documented.

## 2020-02-18 NOTE — Op Note (Signed)
Date of procedure: 02/18/20  Preoperative diagnosis:  1. History of bladder cancer 2. Right lateral wall bladder lesion  Postoperative diagnosis:  1. Same as above  Procedure: 1. Cystoscopy 2. Bilateral retrograde pyelogram 3. TURBT, small 4. Instillation of intravesical gemcitabine  Surgeon: [name], MD  Anesthesia: General  Complications: None  Intraoperative findings: Less than 1 cm area in the right lateral bladder wall with overlying necrosis on 8 texture rise to/carpeting type base highly concerning for persistent/recurrent disease.  The site had previously been fulgurated in the office but persisted.  Bilateral retrograde is unremarkable.  EBL: Minimal  Specimens: Right bladder wall tumor  Drains: 16 French Foley catheter  Indication: Cory Hardin is a 74 y.o. 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  Initially chose to have this fulgurated in the office but the area persisted.  After reviewing the management options for treatment, he elected to proceed with the above surgical procedure(s). We have discussed the potential benefits and risks of the procedure, side effects of the proposed treatment, the likelihood of the patient achieving the goals of the procedure, and any potential problems that might occur during the procedure or recuperation. Informed consent has been obtained.  Description of procedure:  The patient was taken to the operating room and general anesthesia was induced.  The patient was placed in the dorsal lithotomy position, prepped and draped in the usual sterile fashion, and preoperative antibiotics were administered. A preoperative time-out was performed.   A 21 French scope was advanced per urethra into the bladder.  Notably, the patient had a TURP defect.  Attention was turned to the right lateral bladder wall where the overlying necrotic/partially calcified lesion was  identified.  The base of this was viable appearing and it was overlying a papillary carpeting type base adjacent to this area measuring approximately 1 cm concerning for early low-grade recurrence and incompletely fulgurated lesion.  The remainder of the bladder was completely unremarkable.  Attention was then turned to bilateral retrograde pyelogram.  The left UO was cannulated using a 5 [place] open-ended ureteral catheter just within the UO.  Gentle retrograde pyelogram was performed on the side which showed no hydroureteronephrosis or filling defects.  Careful attention was paid to the right lower pole calyx which is mildly dilated but the infundibulum did fill and there is no clear filling defects on this side.  The same exact procedure was performed on the right side which was unremarkable without hydroureteronephrosis or filling defects.  Both sides drained relatively promptly which was reassuring.  Next, cold cup biopsy forceps were used to piece wise resect the area in question.  This was done in 3 or 4 grasped samples.  There is no residual viable or concerning area remaining.  Bugbee electrocautery was then used to fulgurate the base of the area.  There was minimal bleeding.  Hemostasis was excellent.  The latter is carefully reinspected and additional time and no additional tumor was identified.  Finally, 16 French Foley catheter was placed.  The patient was reversed myesthesia taken the PACU in stable condition.  2000 mg of intravesical gemcitabine was instilled in the bladder and allowed to dwell in the PACU for an hour.  This was well-tolerated.  After 1 hour was drained and the Foley was removed.  We will plan to call him with his pathology results.  We will plan for office cystoscopy in 3 months as scheduled.  Caryl Pina  Erlene Quan, M.D.

## 2020-02-18 NOTE — Discharge Instructions (Signed)
Transurethral Resection of Bladder Tumor (TURBT) or Bladder Biopsy ° ° °Definition: ° Transurethral Resection of the Bladder Tumor is a surgical procedure used to diagnose and remove tumors within the bladder. TURBT is the most common treatment for early stage bladder cancer. ° °General instructions: °   ° Your recent bladder surgery requires very little post hospital care but some definite precautions. ° °Despite the fact that no skin incisions were used, the area around the bladder incisions are raw and covered with scabs to promote healing and prevent bleeding. Certain precautions are needed to insure that the scabs are not disturbed over the next 2-4 weeks while the healing proceeds. ° °Because the raw surface inside your bladder and the irritating effects of urine you may expect frequency of urination and/or urgency (a stronger desire to urinate) and perhaps even getting up at night more often. This will usually resolve or improve slowly over the healing period. You may see some blood in your urine over the first 6 weeks. Do not be alarmed, even if the urine was clear for a while. Get off your feet and drink lots of fluids until clearing occurs. If you start to pass clots or don't improve call us. ° °Diet: ° °You may return to your normal diet immediately. Because of the raw surface of your bladder, alcohol, spicy foods, foods high in acid and drinks with caffeine may cause irritation or frequency and should be used in moderation. To keep your urine flowing freely and avoid constipation, drink plenty of fluids during the day (8-10 glasses). Tip: Avoid cranberry juice because it is very acidic. ° °Activity: ° °Your physical activity doesn't need to be restricted. However, if you are very active, you may see some blood in the urine. We suggest that you reduce your activity under the circumstances until the bleeding has stopped. ° °Bowels: ° °It is important to keep your bowels regular during the postoperative  period. Straining with bowel movements can cause bleeding. A bowel movement every other day is reasonable. Use a mild laxative if needed, such as milk of magnesia 2-3 tablespoons, or 2 Dulcolax tablets. Call if you continue to have problems. If you had been taking narcotics for pain, before, during or after your surgery, you may be constipated. Take a laxative if necessary. ° ° ° °Medication: ° °You should resume your pre-surgery medications unless told not to. In addition you may be given an antibiotic to prevent or treat infection. Antibiotics are not always necessary. All medication should be taken as prescribed until the bottles are finished unless you are having an unusual reaction to one of the drugs. ° ° °[facility] °, [place] [address] °[phone] ° ° ° °AMBULATORY SURGERY  °DISCHARGE INSTRUCTIONS ° ° °1) The drugs that you were given will stay in your system until tomorrow so for the next 24 hours you should not: ° °A) Drive an automobile °B) Make any legal decisions °C) Drink any alcoholic beverage ° ° °2) You may resume regular meals tomorrow.  Today it is better to start with liquids and gradually work up to solid foods. ° °You may eat anything you prefer, but it is better to start with liquids, then soup and crackers, and gradually work up to solid foods. ° ° °3) Please notify your doctor immediately if you have any unusual bleeding, trouble breathing, redness and pain at the surgery site, drainage, fever, or pain not relieved by medication. ° ° ° °4) Additional Instructions: ° ° ° ° ° ° ° °  Please contact your physician with any problems or Same Day Surgery at [phone], Monday through Friday 6 am to 4 pm, or [place] [facility] [place] Main number at [phone]. ° °

## 2020-02-18 NOTE — Progress Notes (Signed)
Dr. Ronelle Nigh okay with BP 162/88.

## 2020-02-18 NOTE — Anesthesia Postprocedure Evaluation (Signed)
Anesthesia Post Note  Patient: Cory Hardin  Procedure(s) Performed: TRANSURETHRAL RESECTION OF BLADDER TUMOR WITH gemcitabine (N/A ) CYSTOSCOPY WITH RETROGRADE PYELOGRAM (Bilateral )  Patient location during evaluation: PACU Anesthesia Type: General Level of consciousness: awake and alert Pain management: pain level controlled Vital Signs Assessment: post-procedure vital signs reviewed and stable Respiratory status: spontaneous breathing and respiratory function stable Cardiovascular status: stable Anesthetic complications: no   No complications documented.   Last Vitals:  Vitals:   02/18/20 1208 02/18/20 1223  BP: (!) 145/87 (!) 162/88  Pulse: 61 (!) 56  Resp: 12 11  Temp:  (!) 36.1 C  SpO2: 93% 98%    Last Pain:  Vitals:   02/18/20 1223  TempSrc:   PainSc: 0-No pain                 Tyresha Fede K

## 2020-02-18 NOTE — OR Nursing (Signed)
Per Dr. Erlene Quan, secure chat, pt does not have to void prior to discharge.

## 2020-02-18 NOTE — Anesthesia Procedure Notes (Signed)
Procedure Name: LMA Insertion Date/Time: 02/18/2020 10:41 AM Performed by: Eben Burow, CRNA Pre-anesthesia Checklist: Patient identified, Emergency Drugs available, Suction available and Patient being monitored Patient Re-evaluated:Patient Re-evaluated prior to induction Oxygen Delivery Method: Circle system utilized Preoxygenation: Pre-oxygenation with 100% oxygen Induction Type: IV induction Ventilation: Mask ventilation without difficulty LMA: LMA inserted LMA Size: 4.5 Number of attempts: 1 Placement Confirmation: positive ETCO2 and breath sounds checked- equal and bilateral Tube secured with: Tape Dental Injury: Teeth and Oropharynx as per pre-operative assessment

## 2020-02-18 NOTE — Progress Notes (Signed)
Unclamp foley with Gemcitabine at 11:58 per Dr. Erlene Quan and d/c foley prior to post op.

## 2020-02-18 NOTE — Interval H&P Note (Signed)
History and Physical Interval Note:  02/18/2020 10:11 AM  Cory Hardin  has presented today for surgery, with the diagnosis of bladder cancer.  The various methods of treatment have been discussed with the patient and family. After consideration of risks, benefits and other options for treatment, the patient has consented to  Procedure(s): TRANSURETHRAL RESECTION OF BLADDER TUMOR WITH gemcitabine (N/A) CYSTOSCOPY WITH RETROGRADE PYELOGRAM (Bilateral) as a surgical intervention.  The patient's history has been reviewed, patient examined, no change in status, stable for surgery.  I have reviewed the patient's chart and labs.  Questions were answered to the patient's satisfaction.    RR[facility] CTAB  [name]

## 2020-02-18 NOTE — Anesthesia Preprocedure Evaluation (Signed)
Anesthesia Evaluation  Patient identified by MRN, date of birth, ID band Patient awake    Reviewed: Allergy & Precautions, NPO status , Patient's Chart, lab work & pertinent test results  History of Anesthesia Complications Negative for: history of anesthetic complications  Airway Mallampati: II       Dental   Pulmonary neg sleep apnea, COPD (dx'd no treatment), Not current smoker, former smoker,           Cardiovascular hypertension, Pt. on medications (-) Past MI and (-) CHF (-) dysrhythmias (-) Valvular Problems/Murmurs     Neuro/Psych neg Seizures Anxiety    GI/Hepatic Neg liver ROS, GERD  Medicated and Controlled,  Endo/Other  diabetes (diet controlled), Type 2  Renal/GU Renal InsufficiencyRenal disease (stones)     Musculoskeletal   Abdominal   Peds  Hematology   Anesthesia Other Findings   Reproductive/Obstetrics                             Anesthesia Physical Anesthesia Plan  ASA: III  Anesthesia Plan: General   Post-op Pain Management:    Induction: Intravenous  PONV Risk Score and Plan: 2 and Ondansetron and Dexamethasone  Airway Management Planned: LMA  Additional Equipment:   Intra-op Plan:   Post-operative Plan:   Informed Consent: I have reviewed the patients History and Physical, chart, labs and discussed the procedure including the risks, benefits and alternatives for the proposed anesthesia with the patient or authorized representative who has indicated his/her understanding and acceptance.       Plan Discussed with:   Anesthesia Plan Comments:         Anesthesia Quick Evaluation

## 2020-02-19 ENCOUNTER — Encounter: Payer: Self-pay | Admitting: Urology

## 2020-02-19 LAB — SURGICAL PATHOLOGY

## 2020-03-31 ENCOUNTER — Other Ambulatory Visit: Payer: Self-pay | Admitting: Urology

## 2020-05-20 ENCOUNTER — Other Ambulatory Visit: Payer: Self-pay

## 2020-05-20 ENCOUNTER — Ambulatory Visit: Payer: Medicare Other | Admitting: Urology

## 2020-05-20 VITALS — BP 181/100 | HR 60 | Ht 72.0 in | Wt 190.0 lb

## 2020-05-20 DIAGNOSIS — Z8551 Personal history of malignant neoplasm of bladder: Secondary | ICD-10-CM | POA: Diagnosis not present

## 2020-05-20 LAB — URINALYSIS, COMPLETE
Bilirubin, UA: NEGATIVE
Glucose, UA: NEGATIVE
Ketones, UA: NEGATIVE
Nitrite, UA: NEGATIVE
Specific Gravity, UA: >1.03 — ABNORMAL HIGH (ref 1.005–1.030)
Urobilinogen, Ur: 0.2 mg/dL (ref 0.2–1.0)
pH, UA: 5 (ref 5.0–7.5)

## 2020-05-20 LAB — MICROSCOPIC EXAMINATION

## 2020-05-20 NOTE — Progress Notes (Signed)
   05/20/20  CC:  cysto  HPI: Cory Hardin is a 74 y.o. male with a history of bladder cancer, BPH, elevated PSA and nephrolithiasis who returns fr a cysto.   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 He has a recurrence and undenwent TURBT in 08/2014, pathology LgTA. 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 S/p BCG x 6 completed 02/2015. Cysto 06/2015 suspicious for recurrent, TURBT c/w granulomatous inflammation/ chronic cystitis.  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 B RTG negative. 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 He is undergone multiple stone procedures in the past. 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  Most recent intervention s/p R URS, LL on 06/23/15 s/p URS.  Follow-up renal ultrasound from 09/2015 shows no hydronephrosis. Bilateral nonobstructing stones present.Marland Kitchen KUB 01/2016 with 1.6 cm LLP stone, increased from 1.3 6 months ago. Right small stone stable.  KUB 07/2016 stable. New gall stones appreciated.  KUB 01/2017 unchanged. R URS, LL, stent on 01/2018 for asymptomatic right distal ureteral stone incidental on KUB.   Believes he passed a small stone since last visit.  No current flank pain.  Elevated PSA: PSA elevated on 10/10/15 to 4.3, 3.3 7/17, and 2.9 on 10/217. DRE 7/17 unremarkable. Elected discontinuation of PSA screening.  BPH: History of BPH, bladder stones S/p TURP 04/2014.    Blood pressure (!)  181/100, pulse 60, height 6' (1.829 m), weight 190 lb (86.2 kg). NED. A&Ox3.   No respiratory distress   Abd soft, NT, ND Normal phallus with bilateral descended testicles  Cystoscopy Procedure Note  Patient identification was confirmed, informed consent was obtained, and patient was prepped using Betadine solution.  Lidocaine jelly was administered per urethral meatus.     Pre-Procedure: - Inspection reveals a normal caliber ureteral meatus.  Procedure: The flexible cystoscope was introduced without difficulty - No urethral strictures/lesions are present. - Normal prostate with some mild prostatic regrowth, short fossa with TURP defect - Normal bladder neck -Dystrophic calcifications measuring approximate 1 cm on the right lateral bladder wall in the section of previous resection bed, some mild mucosal edema surrounding with a few areas of shaggy necrosis, consistent with recent TURBT without obvious recurrence (no papillary change) - Bilateral ureteral orifices identified - Mild trabeculation   Post-Procedure: - Patient tolerated the procedure well  Assessment/ Plan:  1. History of bladder cancer/bladder tumor right lateral bladder wall Cystoscopy today with areas of dystrophic calcification of previous resection bed, he has a personal history of this Very low concern for recurrence at this point especially in light of recent pathology consistent with PUNLMP   Will continue to follow this area, repeat cystoscopy in 4 months   I [name], MD

## 2020-06-10 ENCOUNTER — Encounter: Payer: Self-pay | Admitting: Gastroenterology

## 2020-06-29 ENCOUNTER — Other Ambulatory Visit: Payer: Self-pay | Admitting: Urology

## 2020-08-03 ENCOUNTER — Other Ambulatory Visit: Payer: Self-pay | Admitting: Urology

## 2020-08-03 DIAGNOSIS — C679 Malignant neoplasm of bladder, unspecified: Secondary | ICD-10-CM

## 2020-09-22 ENCOUNTER — Other Ambulatory Visit: Payer: Self-pay | Admitting: Urology

## 2020-09-23 ENCOUNTER — Other Ambulatory Visit: Payer: Self-pay

## 2020-09-23 ENCOUNTER — Ambulatory Visit: Payer: Medicare Other | Admitting: Urology

## 2020-09-23 VITALS — BP 177/89 | HR 61 | Wt 190.0 lb

## 2020-09-23 DIAGNOSIS — Z8551 Personal history of malignant neoplasm of bladder: Secondary | ICD-10-CM

## 2020-09-23 LAB — MICROSCOPIC EXAMINATION

## 2020-09-23 LAB — URINALYSIS, COMPLETE
Bilirubin, UA: NEGATIVE
Glucose, UA: NEGATIVE
Ketones, UA: NEGATIVE
Nitrite, UA: NEGATIVE
Specific Gravity, UA: 1.025 (ref 1.005–1.030)
Urobilinogen, Ur: 1 mg/dL (ref 0.2–1.0)
pH, UA: 5.5 (ref 5.0–7.5)

## 2020-09-23 MED ORDER — CEFTRIAXONE SODIUM 1 G IJ SOLR
1.0000 g | Freq: Once | INTRAMUSCULAR | Status: AC
Start: 2020-09-23 — End: 2020-09-23
  Administered 2020-09-23: 1 g via INTRAMUSCULAR

## 2020-09-23 NOTE — Progress Notes (Signed)
09/23/20  CC:  cysto  HPI: Cory Hardin is a 75 y.o. 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 He has a recurrence and undenwent TURBT in 08/2014, pathology LgTA. 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 S/p BCG x 6 completed 02/2015. Cysto 06/2015 suspicious for recurrent, TURBT c/w granulomatous inflammation/ chronic cystitis.  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 B RTG negative. 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 He is undergone multiple stone procedures in the past. 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  Most recent intervention s/p R URS, LL on 06/23/15 s/p URS.  Follow-up renal ultrasound from 09/2015 shows no hydronephrosis. Bilateral nonobstructing stones present.Marland Kitchen KUB 01/2016 with 1.6 cm LLP stone, increased from 1.3 6 months ago. Right small stone stable.  KUB 07/2016 stable. New gall stones appreciated.  KUB 01/2017 unchanged. R URS, LL, stent on 01/2018 for asymptomatic right distal ureteral stone incidental on KUB.    Elevated PSA: PSA elevated on 10/10/15 to 4.3, 3.3 7/17, and 2.9 on 10/217. DRE 7/17 unremarkable. Elected discontinuation of PSA screening.  BPH: History of BPH, bladder stones S/p TURP 04/2014.    Blood pressure (!) 177/89, pulse 61, weight 190 lb (86.2 kg). NED. A&Ox3.   No  respiratory distress   Abd soft, NT, ND Normal phallus with bilateral descended testicles  Cystoscopy Procedure Note  Patient identification was confirmed, informed consent was obtained, and patient was prepped using Betadine solution.  Lidocaine jelly was administered per urethral meatus.     Pre-Procedure: - Inspection reveals a normal caliber ureteral meatus.  Procedure: The flexible cystoscope was introduced without difficulty - No urethral strictures/lesions are present. - Normal prostate with some mild prostatic regrowth, short fossa with TURP defect - Normal bladder neck -Dystrophic calcifications measuring approximate 8 mm x 4 on the right lateral bladder wall in the section of previous resection bed.  Used the scope was used to dislodge 3 of these calcifications and the fourth was dislodged using a 1.9 [place] tipless nitinol basket.  There was slight mucosal bleeding but no papillary change underlying these dystrophic calcifications.  The largest of these were removed by using the basket per urethra which is relatively tight fit. - Bilateral ureteral orifices identified - Mild trabeculation   Post-Procedure: - Patient tolerated the procedure well  Assessment/ Plan:  1. History of bladder cancer/bladder tumor right lateral bladder wall Cystoscopy today with areas of dystrophic calcification of previous resection bed, he has a personal history of this  Due to the enlarging size of these calcification, I did dislodge all of them today and remove the larger ones, the smaller ones will be able to be avoided as they were approaching the size that they may become obstructive when dislodged  He was given ceftriaxone today as a precaution due to the increased manipulation     Will continue to follow this area, repeat cystoscopy in 4 months (shared decision making and desire to decrease  her frequency of cystos)    [name], MD

## 2020-09-24 ENCOUNTER — Other Ambulatory Visit: Payer: Self-pay | Admitting: Urology

## 2020-10-28 ENCOUNTER — Other Ambulatory Visit: Payer: Self-pay | Admitting: Urology

## 2020-10-28 DIAGNOSIS — C679 Malignant neoplasm of bladder, unspecified: Secondary | ICD-10-CM

## 2020-12-24 ENCOUNTER — Other Ambulatory Visit: Payer: Self-pay | Admitting: Urology

## 2021-01-28 ENCOUNTER — Other Ambulatory Visit: Payer: Self-pay

## 2021-01-28 ENCOUNTER — Ambulatory Visit (INDEPENDENT_AMBULATORY_CARE_PROVIDER_SITE_OTHER): Payer: Medicare Other | Admitting: Urology

## 2021-01-28 VITALS — BP 166/92 | HR 54 | Ht 72.0 in | Wt 188.0 lb

## 2021-01-28 DIAGNOSIS — Z8551 Personal history of malignant neoplasm of bladder: Secondary | ICD-10-CM | POA: Diagnosis not present

## 2021-01-28 LAB — MICROSCOPIC EXAMINATION: Bacteria, UA: NONE SEEN

## 2021-01-28 LAB — URINALYSIS, COMPLETE
Bilirubin, UA: NEGATIVE
Ketones, UA: NEGATIVE
Leukocytes,UA: NEGATIVE
Nitrite, UA: NEGATIVE
Protein,UA: NEGATIVE
Specific Gravity, UA: 1.025 (ref 1.005–1.030)
Urobilinogen, Ur: 0.2 mg/dL (ref 0.2–1.0)
pH, UA: 7 (ref 5.0–7.5)

## 2021-01-28 NOTE — Progress Notes (Signed)
   01/28/21  CC:  cysto  HPI: Cory Hardin is a 75 y.o. 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  He has a recurrence and undenwent TURBT in 08/2014, pathology LgTA.  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  S/p BCG x 6 completed 02/2015.  Cysto 06/2015 suspicious for recurrent, TURBT c/w granulomatous inflammation/ chronic cystitis.   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  B RTG negative. 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 He is undergone multiple stone procedures in the past. 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   Most recent intervention s/p R URS, LL on 06/23/15 s/p URS.   Follow-up renal ultrasound from 09/2015 shows no hydronephrosis. Bilateral nonobstructing stones present.Marland Kitchen KUB 01/2016  with 1.6 cm LLP stone, increased from 1.3 6 months ago.  Right small stone stable.  KUB 07/2016 stable. New gall stones appreciated.  KUB 01/2017 unchanged.    R URS, LL, stent on 01/2018 for asymptomatic right distal ureteral stone incidental on KUB.       Elevated PSA: PSA elevated on 10/10/15 to 4.3,  3.3 7/17, and 2.9 on 10/217.  DRE 7/17 unremarkable.  Elected discontinuation of PSA screening.   BPH: History of BPH, bladder stones S/p TURP 04/2014.      Blood pressure (!) 166/92, pulse (!) 54, height 6' (1.829 m), weight 188 lb (85.3  kg). NED. A&Ox3.   No respiratory distress   Abd soft, NT, ND Normal phallus with bilateral descended testicles  Cystoscopy Procedure Note  Patient identification was confirmed, informed consent was obtained, and patient was prepped using Betadine solution.  Lidocaine jelly was administered per urethral meatus.     Pre-Procedure: - Inspection reveals a normal caliber ureteral meatus.  Procedure: The flexible cystoscope was introduced without difficulty - No urethral strictures/lesions are present. - Normal prostate with some mild prostatic regrowth, short fossa with TURP defect - Normal bladder neck -On the right bladder neck, there are some very small subtle texturized change, 0.5 cm which is ill defined,?  Inflammation versus early recurrence - Bilateral ureteral orifices identified - Mild trabeculation   Post-Procedure: - Patient tolerated the procedure well  Assessment/ Plan:  1. History of bladder cancer/bladder tumor right lateral bladder wall Subtle changes on the right lateral wall, unclear whether or not this is inflammation versus early recurrence, we will continue to follow this area but hold off on any intervention  Will continue to follow this area, repeat cystoscopy in 4 months (shared decision making and desire to decrease her frequency of cystos)  F/u 4 months  [name], MD

## 2021-03-24 ENCOUNTER — Other Ambulatory Visit: Payer: Self-pay | Admitting: Urology

## 2021-03-26 ENCOUNTER — Other Ambulatory Visit: Payer: Self-pay | Admitting: Urology

## 2021-05-05 ENCOUNTER — Other Ambulatory Visit: Payer: Self-pay | Admitting: Urology

## 2021-05-05 DIAGNOSIS — C679 Malignant neoplasm of bladder, unspecified: Secondary | ICD-10-CM

## 2021-05-07 ENCOUNTER — Other Ambulatory Visit: Payer: Self-pay | Admitting: Urology

## 2021-05-07 DIAGNOSIS — C679 Malignant neoplasm of bladder, unspecified: Secondary | ICD-10-CM

## 2021-06-01 NOTE — Progress Notes (Signed)
   06/02/2021 CC:  Chief Complaint  Patient presents with   Cysto     HPI: Cory Hardin is a 75 y.o. male with a personal history of bladder cancer, BPH, elevated PSA, and nephrolithiasis, who returns today for surveillance cystoscopy.   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 He has a recurrence an underwent repeat TURBT on 08/2014, pathology revealed LgTA. 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  S/p BCG x 6 completed 02/2015. Cysto 06/2015 suspicious for recurrent, TURBT c/w granulomatous inflammation/ chronic cystitis.   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  B RTG negative.  Originally scheduled for TURBT on 10/29/19 which he cancelled due to preference of cysto w/ in-office fulguration for small low grade appearing recurrence - well tolerated.   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 His most recent intervention was a Ureteroscopy, LL, stent on 01/2018 for asymptomatic right distal ureteral stone incidental on KUB.      He is s/p TURP 04/2014.   His most recent PSA 2.9 04/2016, stopped screening  Vitals:   06/02/21 0849  BP: 140/80  Pulse: 76   NED. A&Ox3.   No respiratory distress   Abd soft, NT, ND Normal phallus with bilateral descended testicles  Cystoscopy Procedure Note  Patient identification was confirmed, informed consent was obtained, and patient was prepped using Betadine solution.  Lidocaine jelly was administered per urethral meatus.     Pre-Procedure: - Inspection reveals a normal caliber ureteral meatus.  Procedure: The flexible cystoscope was introduced without difficulty - No urethral  strictures/lesions are present. - Normal prostate, short fossa with TURP defect - Normal bladder neck - On the right bladder neck there are some very small subtle texturized change seen in previous cystoscopy has improved slightly   - Bilateral ureteral orifices identified - Bladder mucosa  reveals no ulcers, tumors, or lesions - No bladder stones - mild  trabeculation    Post-Procedure: - Patient tolerated the procedure well   Assessment/ Plan:  1. History of bladder cancer/bladder tumor right lateral bladder wall - NED - Will continue to monitor the right side of bladder neck. It is unchanged today.  - Continue 4 month cystoscopy, if it is unremarkable will transition to 6 months   Return in 4 months for cystoscopy   I,Kailey Littlejohn,acting as a scribe for [name], MD.,have documented all relevant documentation on the behalf of [name], MD,as directed by  [name], MD while in the presence of [name], MD.  I have reviewed the above documentation for accuracy and completeness, and I agree with the above.   [name], MD

## 2021-06-02 ENCOUNTER — Other Ambulatory Visit: Payer: Self-pay

## 2021-06-02 ENCOUNTER — Encounter: Payer: Self-pay | Admitting: Urology

## 2021-06-02 ENCOUNTER — Ambulatory Visit: Payer: Medicare Other | Admitting: Urology

## 2021-06-02 VITALS — BP 140/80 | HR 76 | Ht 72.0 in | Wt 182.0 lb

## 2021-06-02 DIAGNOSIS — Z8551 Personal history of malignant neoplasm of bladder: Secondary | ICD-10-CM | POA: Diagnosis not present

## 2021-06-02 LAB — MICROSCOPIC EXAMINATION: Bacteria, UA: NONE SEEN

## 2021-06-02 LAB — URINALYSIS, COMPLETE
Bilirubin, UA: NEGATIVE
Glucose, UA: NEGATIVE
Ketones, UA: NEGATIVE
Nitrite, UA: NEGATIVE
Protein,UA: NEGATIVE
Specific Gravity, UA: 1.025 (ref 1.005–1.030)
Urobilinogen, Ur: 0.2 mg/dL (ref 0.2–1.0)
pH, UA: 6 (ref 5.0–7.5)

## 2021-06-25 ENCOUNTER — Other Ambulatory Visit: Payer: Self-pay | Admitting: Urology

## 2021-07-05 HISTORY — PX: CATARACT EXTRACTION, BILATERAL: SHX1313

## 2021-07-21 LAB — LAB REPORT - SCANNED
A1c: 6.9
EGFR: 52

## 2021-09-26 ENCOUNTER — Other Ambulatory Visit: Payer: Self-pay | Admitting: Urology

## 2021-10-01 LAB — HM DIABETES EYE EXAM

## 2021-10-05 NOTE — Progress Notes (Signed)
? ?  10/06/21 ? ?CC:  ?Chief Complaint  ?Patient presents with  ? Cysto  ? ? ?HPI: ?Cory Hardin is a 76 y.o. male with a personal history of bladder cancer, BPH, elevated PSA, and nephrolithiasis, who returns today for surveillance cystoscopy.  ?  ?He is s/p TURBT on 03/07/14 that revealed LG Ta TCC. He has a recurrence an underwent repeat TURBT on 08/2014, pathology revealed LgTA. 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  S/p BCG x 6 completed 02/2015. Cysto 06/2015 suspicious for recurrent, TURBT c/w granulomatous inflammation/ chronic cystitis.   ?TURBT 01/30/2018, pathology LgTa TCC adjacent to R UO.  B RTG negative. ?  ?Originally scheduled for TURBT on 10/29/19 which he cancelled due to preference of cysto w/ in-office fulguration for small low grade appearing recurrence - well tolerated.  ?  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  ?He is currently on hydrochlorothiazide and hypercalciuria and is s/p multiple stone procedures. He is most recently s/p ureteroscopy on 01/2018.  ? ? ?Vitals:  ? 10/06/21 0929  ?BP: (!) 177/90  ?Pulse: (!) 51  ? ?NED. A&Ox3.   ?No respiratory distress   ?Abd soft, NT, ND ?Normal phallus with bilateral descended testicles ? ?Cystoscopy Procedure Note ? ?Patient identification was confirmed, informed consent was obtained, and patient was prepped using Betadine solution.  Lidocaine jelly was administered per urethral meatus.   ? ? ?Pre-Procedure: ?- Inspection reveals a normal caliber ureteral meatus. ? ?Procedure: ?The flexible cystoscope was introduced without difficulty ?- No urethral strictures/lesions are present. ?- Normal prostate short fossa with TURP defect ?- Normal bladder neck ?- On the right bladder neck there are some very small subtle texturized  change seen in previous cystoscopy has remained stable  ?- Bilateral ureteral orifices identified  ?- Bladder mucosa  reveals no ulcers, tumors, or lesions ?- No bladder stones ?- mild trabeculation ? ? ?Post-Procedure: ?- Patient tolerated the procedure well ? ? ?Assessment/ Plan: ? ?1. History of bladder cancer ?- Will continue to monitor the right side of bladder neck. It is unchanged today is reassuring, may represent early recurrence versus benign process.  Recommend holding off on any intervention given lack of interval change.  He is agreeable this plan. ?- Continue with 6 month cystoscopy.  ? ? ? ?Conley Rolls as a scribe for [name], MD.,have documented all relevant documentation on the behalf of [name], MD,as directed by  [name], MD while in the presence of [name], MD. ? ?I have reviewed the above documentation for accuracy and completeness, and I agree with the above.  ? ?[name], MD ? ?

## 2021-10-06 ENCOUNTER — Ambulatory Visit: Payer: Medicare Other | Admitting: Urology

## 2021-10-06 VITALS — BP 177/90 | HR 51 | Ht 72.0 in | Wt 182.0 lb

## 2021-10-06 DIAGNOSIS — Z8551 Personal history of malignant neoplasm of bladder: Secondary | ICD-10-CM

## 2021-10-06 LAB — URINALYSIS, COMPLETE
Bilirubin, UA: NEGATIVE
Ketones, UA: NEGATIVE
Nitrite, UA: NEGATIVE
Protein,UA: NEGATIVE
Specific Gravity, UA: 1.02 (ref 1.005–1.030)
Urobilinogen, Ur: 0.2 mg/dL (ref 0.2–1.0)
pH, UA: 6 (ref 5.0–7.5)

## 2021-10-06 LAB — MICROSCOPIC EXAMINATION: Bacteria, UA: NONE SEEN

## 2021-11-24 LAB — LAB REPORT - SCANNED
A1c: 6.6
EGFR: 57

## 2021-12-28 ENCOUNTER — Other Ambulatory Visit: Payer: Self-pay | Admitting: Urology

## 2022-03-28 ENCOUNTER — Other Ambulatory Visit: Payer: Self-pay | Admitting: Urology

## 2022-04-06 ENCOUNTER — Other Ambulatory Visit: Payer: Medicare Other | Admitting: Urology

## 2022-05-04 LAB — LAB REPORT - SCANNED
A1c: 6.4
EGFR: 49

## 2022-05-25 ENCOUNTER — Encounter: Payer: Self-pay | Admitting: Urology

## 2022-05-25 ENCOUNTER — Ambulatory Visit: Payer: Medicare Other | Admitting: Urology

## 2022-05-25 VITALS — BP 161/84 | HR 50 | Ht 69.0 in | Wt 178.0 lb

## 2022-05-25 DIAGNOSIS — Z8551 Personal history of malignant neoplasm of bladder: Secondary | ICD-10-CM

## 2022-05-25 LAB — URINALYSIS, COMPLETE
Bilirubin, UA: NEGATIVE
Glucose, UA: NEGATIVE
Ketones, UA: NEGATIVE
Nitrite, UA: NEGATIVE
Protein,UA: NEGATIVE
Specific Gravity, UA: 1.02 (ref 1.005–1.030)
Urobilinogen, Ur: 1 mg/dL (ref 0.2–1.0)
pH, UA: 6.5 (ref 5.0–7.5)

## 2022-05-25 LAB — MICROSCOPIC EXAMINATION

## 2022-05-25 NOTE — Progress Notes (Signed)
   05/25/22  CC:  Chief Complaint  Patient presents with   Cysto    HPI: Cory Hardin is a 76 y.o. male with a personal history of bladder cancer, BPH, elevated PSA, and nephrolithiasis, who returns today for surveillance cystoscopy.    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 He has a recurrence an underwent repeat TURBT on 08/2014, pathology revealed LgTA. 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  S/p BCG x 6 completed 02/2015. Cysto 06/2015 suspicious for recurrent, TURBT c/w granulomatous inflammation/ chronic cystitis.   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  B RTG negative.   Originally scheduled for TURBT on 10/29/19 which he cancelled due to preference of cysto w/ in-office fulguration for small low grade appearing recurrence - well tolerated.    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 He is most recently s/p ureteroscopy on 01/2018.    Vitals:   05/25/22 1020  BP: (!) 161/84  Pulse: (!) 50   NED. A&Ox3.   No respiratory distress   Abd soft, NT, ND Normal phallus with bilateral descended testicles  Cystoscopy Procedure Note  Patient identification was confirmed, informed consent was obtained, and patient was prepped using Betadine solution.  Lidocaine jelly was administered per urethral meatus.     Pre-Procedure: - Inspection reveals a normal caliber ureteral meatus.  Procedure: The flexible cystoscope was introduced without difficulty - No urethral strictures/lesions are present. - Normal prostate short fossa with TURP defect - Normal bladder neck - Right side of bladder neck is unremarkable today, no concerning  findings - Bilateral ureteral orifices identified  - Bladder mucosa  reveals no ulcers, tumors, or lesions - No bladder stones - mild trabeculation   Post-Procedure: - Patient tolerated the procedure well   Assessment/ Plan:  1. History of bladder cancer NED today  Last recurrence as over 2 years  ago, previously all low grade  OK to transition to q annual cysto, warning symptoms reviewed  Return in 1 year (on 05/26/2023) for 1 year follow-up for cysto.   [name], MD

## 2022-06-23 ENCOUNTER — Other Ambulatory Visit: Payer: Self-pay | Admitting: Urology

## 2022-07-02 ENCOUNTER — Other Ambulatory Visit: Payer: Self-pay | Admitting: Urology

## 2022-07-02 DIAGNOSIS — C679 Malignant neoplasm of bladder, unspecified: Secondary | ICD-10-CM

## 2022-07-05 HISTORY — PX: REFRACTIVE SURGERY: SHX103

## 2022-08-01 ENCOUNTER — Other Ambulatory Visit: Payer: Self-pay | Admitting: Urology

## 2022-08-01 DIAGNOSIS — C679 Malignant neoplasm of bladder, unspecified: Secondary | ICD-10-CM

## 2022-08-02 NOTE — Telephone Encounter (Signed)
Refill provided

## 2022-08-02 NOTE — Telephone Encounter (Signed)
Ok to refill or send to PCP for further refills?

## 2022-09-26 ENCOUNTER — Other Ambulatory Visit: Payer: Self-pay | Admitting: Urology

## 2022-09-26 LAB — LAB REPORT - SCANNED
Albumin, Urine POC: 50.6
Creatinine, POC: 111.7 mg/dL
EGFR: 57
HM Hepatitis Screen: NEGATIVE
Microalb Creat Ratio: 45

## 2022-09-30 LAB — HEMOGLOBIN A1C: A1c: 7.5

## 2022-12-22 ENCOUNTER — Other Ambulatory Visit: Payer: Self-pay | Admitting: Urology

## 2023-02-01 ENCOUNTER — Other Ambulatory Visit: Payer: Self-pay | Admitting: Urology

## 2023-02-01 DIAGNOSIS — C679 Malignant neoplasm of bladder, unspecified: Secondary | ICD-10-CM

## 2023-02-11 LAB — LAB REPORT - SCANNED
A1c: 6.7
EGFR: 52

## 2023-03-24 ENCOUNTER — Other Ambulatory Visit: Payer: Self-pay | Admitting: Urology

## 2023-05-25 ENCOUNTER — Encounter: Payer: Self-pay | Admitting: Urology

## 2023-05-25 ENCOUNTER — Ambulatory Visit: Payer: Medicare Other | Admitting: Urology

## 2023-05-25 VITALS — BP 175/76 | HR 60

## 2023-05-25 DIAGNOSIS — N3289 Other specified disorders of bladder: Secondary | ICD-10-CM | POA: Diagnosis not present

## 2023-05-25 DIAGNOSIS — Z8551 Personal history of malignant neoplasm of bladder: Secondary | ICD-10-CM

## 2023-05-25 LAB — MICROSCOPIC EXAMINATION

## 2023-05-25 LAB — URINALYSIS, COMPLETE
Bilirubin, UA: NEGATIVE
Ketones, UA: NEGATIVE
Leukocytes,UA: NEGATIVE
Nitrite, UA: NEGATIVE
Specific Gravity, UA: >1.03 — ABNORMAL HIGH (ref 1.005–1.030)
Urobilinogen, Ur: 0.2 mg/dL (ref 0.2–1.0)
pH, UA: 5.5 (ref 5.0–7.5)

## 2023-05-25 NOTE — Progress Notes (Signed)
   05/25/23  CC:  Chief Complaint  Patient presents with   Cysto    HPI: Cory Hardin is a 77 y.o. male with a personal history of bladder cancer, BPH, elevated PSA, and nephrolithiasis, who returns today for surveillance cystoscopy.    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 He has a recurrence an underwent repeat TURBT on 08/2014, pathology revealed LgTA. 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  S/p BCG x 6 completed 02/2015. Cysto 06/2015 suspicious for recurrent, TURBT c/w granulomatous inflammation/ chronic cystitis.   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  B RTG negative.   Originally scheduled for TURBT on 10/29/19 which he cancelled due to preference of cysto w/ in-office fulguration for small low grade appearing recurrence - well tolerated.    He had a recurrence in 02/2020 s/p TURBT, B RTG, gemcitabime, pathology PUNLMP c/w lamina propria calcifications/ chronic inflammation.   He is currently on hydrochlorothiazide and hypercalciuria and is s/p multiple stone procedures. He is most recently s/p ureteroscopy on 01/2018.   He had present history of BPH.  He status post TURP.  He mL of her having his PSA checked.   Vitals:   05/25/23 0836  BP: (!) 175/76  Pulse: 60   NED. A&Ox3.   No respiratory distress   Abd soft, NT, ND Normal phallus with bilateral descended testicles  Cystoscopy Procedure Note  Patient identification was confirmed, informed consent was obtained, and patient was prepped using Betadine solution.  Lidocaine jelly was administered per urethral meatus.     Pre-Procedure: - Inspection reveals a normal caliber ureteral meatus.  Procedure: The flexible cystoscope was introduced without difficulty - No urethral strictures/lesions are present. - Normal prostate short fossa with TURP defect - Normal  bladder neck - Bilateral ureteral orifices identified  - Bladder mucosa  reveals multiple stellate scars.  On the right posterior lateral aspect of the bladder there is up to 5 mm areas of raised slightly erythematous thematous mucosa consistent with probable early recurrence of bladder cancer. - No bladder stones - mild trabeculation   Post-Procedure: - Patient tolerated the procedure well   Assessment/ Plan:  1. History of bladder cancer/bladder lesion Probable early recurrence noted today on office cystoscopy of his bladder cancer  We discussed another in office fulguration versus proceeding to the operating room for TURBT and intravesical gemcitabine.  We discussed the advantages of the operating room which would include the ability to get meaningful pathology results, deliver chemo as well as retrogrades.  That being said, there is also extensive send time related to this.  Given these had low-grade superficial cancer only, is renal to also consider office fulguration.   He is most interested in office fulguration.  He will return for this.  All questions answered.    Vanna Scotland, MD

## 2023-06-01 ENCOUNTER — Telehealth: Payer: Self-pay | Admitting: Urology

## 2023-06-01 NOTE — Telephone Encounter (Signed)
Spoke with patient about Fulguration next week and TURBT that Dr Apolinar Junes discussed with patient during last visit. He will keep his appointment for 12/4 at this time

## 2023-06-01 NOTE — Telephone Encounter (Signed)
Patient called and has some questions that he would like to ask prior to his appointment on 06/08/23 with Dr. Apolinar Junes. Please call him on his cell number; [phone]

## 2023-06-08 ENCOUNTER — Ambulatory Visit: Payer: Medicare Other | Admitting: Urology

## 2023-06-08 ENCOUNTER — Encounter: Payer: Self-pay | Admitting: Urology

## 2023-06-08 VITALS — BP 198/96 | HR 66 | Ht 70.0 in | Wt 182.0 lb

## 2023-06-08 DIAGNOSIS — Z792 Long term (current) use of antibiotics: Secondary | ICD-10-CM

## 2023-06-08 DIAGNOSIS — N3289 Other specified disorders of bladder: Secondary | ICD-10-CM

## 2023-06-08 DIAGNOSIS — Z8551 Personal history of malignant neoplasm of bladder: Secondary | ICD-10-CM

## 2023-06-08 LAB — MICROSCOPIC EXAMINATION

## 2023-06-08 LAB — URINALYSIS, COMPLETE
Bilirubin, UA: NEGATIVE
Glucose, UA: NEGATIVE
Ketones, UA: NEGATIVE
Nitrite, UA: NEGATIVE
Protein,UA: NEGATIVE
Specific Gravity, UA: 1.025 (ref 1.005–1.030)
Urobilinogen, Ur: 0.2 mg/dL (ref 0.2–1.0)
pH, UA: 5.5 (ref 5.0–7.5)

## 2023-06-08 MED ORDER — CEPHALEXIN 250 MG PO CAPS
500.0000 mg | ORAL_CAPSULE | Freq: Once | ORAL | Status: AC
  Administered 2023-06-08: 500 mg via ORAL

## 2023-06-08 NOTE — Progress Notes (Signed)
Bladder Instillation  Due to Fulguration patient is present today for a Bladder Instillation of Lidocaine 2%. Patient was cleaned and prepped in a sterile fashion with betadine and lidocaine 2% jelly was instilled into the urethra.  A 14 FR catheter was inserted, urine return was noted 40 ml, urine was yellow in color.  60 ml was instilled into the bladder. The catheter was then removed. Patient tolerated well, no complications were noted. Patient held in bladder for 30 minutes prior to procedure starting.   Performed by: Caulin Begley H RMA  Follow up/ Additional notes:

## 2023-06-15 NOTE — Progress Notes (Signed)
   06/08/23  CC:  Chief Complaint  Patient presents with   Cysto    Fulguration     HPI: Cory Hardin is a 77 y.o. male with a personal history of bladder cancer, BPH, elevated PSA, and nephrolithiasis, who returns today for surveillance cystoscopy.    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 He has a recurrence an underwent repeat TURBT on 08/2014, pathology revealed LgTA. 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  S/p BCG x 6 completed 02/2015. Cysto 06/2015 suspicious for recurrent, TURBT c/w granulomatous inflammation/ chronic cystitis.   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  B RTG negative.   Originally scheduled for TURBT on 10/29/19 which he cancelled due to preference of cysto w/ in-office fulguration for small low grade appearing recurrence - well tolerated.    He had a recurrence in 02/2020 s/p TURBT, B RTG, gemcitabime, pathology PUNLMP c/w lamina propria calcifications/ chronic inflammation.   He is currently on hydrochlorothiazide and hypercalciuria and is s/p multiple stone procedures. He is most recently s/p ureteroscopy on 01/2018.   He had present history of BPH.  He status post TURP.  He mL of her having his PSA checked.  Last cystoscopy showed possibility of early recurrence on the posterior bladder wall, carpeting of papillary presumed early recurrence of tumor on the about 5 mm.  He elected for in office fulguration.  He presents for this today.   Vitals:   06/08/23 1435  BP: (!) 198/96  Pulse: 66   NED. A&Ox3.   No respiratory distress   Abd soft, NT, ND Normal phallus with bilateral descended testicles  Prior to the procedure today, his bladder was instilled with lidocaine which is allowed to dwell for 30 minutes.  He also received a dose of periprocedural antibiotics.  Cystoscopy/ Fulgeration Procedure  Note  Patient identification was confirmed, informed consent was obtained, and patient was prepped using Betadine solution.  Lidocaine jelly was administered per urethral meatus.     Pre-Procedure: - Inspection reveals a normal caliber ureteral meatus.  Procedure: The flexible cystoscope was introduced without difficulty - No urethral strictures/lesions are present. - Normal prostate short fossa with TURP defect - Normal bladder neck - Bilateral ureteral orifices identified  - Bladder mucosa  reveals multiple stellate scars.  On the right posterior lateral aspect of the bladder there is up to 5 mm areas of raised slightly erythematous thematous mucosa consistent with probable early recurrence of bladder cancer.  At this point in time using water as the medium, the patient was grounded and a Bugbee was used to fulgurate the small lesion in question is described above.  This is well-tolerated.  After the procedure, there was no additional tumor apparent.   Post-Procedure: - Patient tolerated the procedure well   Assessment/ Plan:  1. History of bladder cancer/bladder lesion Status post destructive fulguration today which is well-tolerated, presumably early recurrence  Plan for surveillance, reassess again in 3 months to ensure that there is no additional tumor.  He is agreeable with this plan.   Vanna Scotland, MD

## 2023-06-24 ENCOUNTER — Other Ambulatory Visit: Payer: Self-pay | Admitting: Urology

## 2023-07-30 LAB — LAB REPORT - SCANNED
A1c: 7
Albumin, Urine POC: 17.4
Creatinine, POC: 43 mg/dL
EGFR: 51
Microalb Creat Ratio: 40

## 2023-09-14 ENCOUNTER — Ambulatory Visit: Payer: Medicare Other | Admitting: Urology

## 2023-09-14 VITALS — BP 181/85 | HR 64 | Ht 70.0 in | Wt 195.1 lb

## 2023-09-14 DIAGNOSIS — Z8551 Personal history of malignant neoplasm of bladder: Secondary | ICD-10-CM

## 2023-09-14 LAB — URINALYSIS, COMPLETE
Bilirubin, UA: NEGATIVE
Ketones, UA: NEGATIVE
Nitrite, UA: NEGATIVE
Protein,UA: NEGATIVE
Specific Gravity, UA: 1.025 (ref 1.005–1.030)
Urobilinogen, Ur: 0.2 mg/dL (ref 0.2–1.0)
pH, UA: 5.5 (ref 5.0–7.5)

## 2023-09-14 LAB — MICROSCOPIC EXAMINATION: RBC, Urine: >30 /HPF — AB (ref 0–2)

## 2023-09-14 NOTE — Progress Notes (Signed)
   09/14/23   CC:  Chief Complaint  Patient presents with   Cysto    HPI: Cory Hardin is a 78 y.o. male with a personal history of bladder cancer, BPH, elevated PSA, and nephrolithiasis, who returns today for surveillance cystoscopy.    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 He has a recurrence an underwent repeat TURBT on 08/2014, pathology revealed LgTA. 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  S/p BCG x 6 completed 02/2015. Cysto 06/2015 suspicious for recurrent, TURBT c/w granulomatous inflammation/ chronic cystitis.   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  B RTG negative.   Originally scheduled for TURBT on 10/29/19 which he cancelled due to preference of cysto w/ in-office fulguration for small low grade appearing recurrence - well tolerated.    He had a recurrence in 02/2020 s/p TURBT, B RTG, gemcitabime, pathology PUNLMP c/w lamina propria calcifications/ chronic inflammation.  Most recently, there was concern for early local recurrence in the posterior right bladder wall.  This was fulgurated in the office on 12/24 which was well tolerated.     He is currently on hydrochlorothiazide and hypercalciuria and is s/p multiple stone procedures. He is most recently s/p ureteroscopy on 01/2018.   He had present history of BPH.  He status post TURP.  He has elected to stop having his PSA checked.    Vitals:   09/14/23 0913  BP: (!) 181/85  Pulse: 64   NED. A&Ox3.   No respiratory distress   Abd soft, NT, ND Normal phallus with bilateral descended testicles  Prior to the procedure today, his bladder was instilled with lidocaine which is allowed to dwell for 30 minutes.  He also received a dose of periprocedural antibiotics.  Cystoscopy/ Fulgeration Procedure Note  Patient identification was confirmed, informed consent  was obtained, and patient was prepped using Betadine solution.  Lidocaine jelly was administered per urethral meatus.     Pre-Procedure: - Inspection reveals a normal caliber ureteral meatus.  Procedure: The flexible cystoscope was introduced without difficulty - No urethral strictures/lesions are present. - Normal prostate short fossa with TURP defect - Normal bladder neck - Bilateral ureteral orifices identified  - Bladder mucosa  reveals multiple stellate scars including on the right posterior lateral aspect of the bladder.  Post-Procedure: - Patient tolerated the procedure well   Assessment/ Plan:  1. History of bladder cancer/bladder lesion No evidence of disease today  Given his fairly recent presumed recurrence, will will continue to follow him somewhat closely.  Plan for cystoscopy in 4 months.  Vanna Scotland, MD

## 2023-10-17 ENCOUNTER — Encounter: Payer: Self-pay | Admitting: Urology

## 2023-11-30 ENCOUNTER — Ambulatory Visit: Admitting: Urology

## 2023-11-30 VITALS — BP 177/84 | HR 56

## 2023-11-30 DIAGNOSIS — Z8551 Personal history of malignant neoplasm of bladder: Secondary | ICD-10-CM

## 2023-11-30 LAB — URINALYSIS, COMPLETE
Bilirubin, UA: NEGATIVE
Ketones, UA: NEGATIVE
Nitrite, UA: NEGATIVE
Protein,UA: NEGATIVE
Specific Gravity, UA: 1.025 (ref 1.005–1.030)
Urobilinogen, Ur: 1 mg/dL (ref 0.2–1.0)
pH, UA: 6 (ref 5.0–7.5)

## 2023-11-30 LAB — MICROSCOPIC EXAMINATION: RBC, Urine: >30 /HPF — AB (ref 0–2)

## 2023-11-30 NOTE — Progress Notes (Signed)
   11/30/23   CC:  Chief Complaint  Patient presents with   Cysto    HPI: Cory Hardin is a 78 y.o. male with a personal history of bladder cancer, BPH, elevated PSA, and nephrolithiasis, who returns today for surveillance cystoscopy.    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 He has a recurrence an underwent repeat TURBT on 08/2014, pathology revealed LgTA. 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  S/p BCG x 6 completed 02/2015. Cysto 06/2015 suspicious for recurrent, TURBT c/w granulomatous inflammation/ chronic cystitis.   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  B RTG negative.   Originally scheduled for TURBT on 10/29/19 which he cancelled due to preference of cysto w/ in-office fulguration for small low grade appearing recurrence - well tolerated.    He had a recurrence in 02/2020 s/p TURBT, B RTG, gemcitabime, pathology PUNLMP c/w lamina propria calcifications/ chronic inflammation.  Most recently, there was concern for early local recurrence in the posterior right bladder wall.  This was fulgurated in the office on 12/24 which was well tolerated.     He is currently on hydrochlorothiazide  and hypercalciuria and is s/p multiple stone procedures. He is most recently s/p ureteroscopy on 01/2018.   He had present history of BPH.  He status post TURP.  He has elected to stop having his PSA checked.  After last cystoscopy in March 2025, he was scheduled for 26-month cystoscopy however presents earlier today than scheduled because he like to have a cystoscopy before my departure from the practice.    Vitals:   11/30/23 1441  BP: (!) 177/84  Pulse: (!) 56   NED. A&Ox3.   No respiratory distress   Abd soft, NT, ND Normal phallus with bilateral descended testicles  Prior to the procedure today, his bladder was instilled with  lidocaine  which is allowed to dwell for 30 minutes.  He also received a dose of periprocedural antibiotics.  Cystoscopy/ Fulgeration Procedure Note  Patient identification was confirmed, informed consent was obtained, and patient was prepped using Betadine solution.  Lidocaine  jelly was administered per urethral meatus.     Pre-Procedure: - Inspection reveals a normal caliber ureteral meatus.  Procedure: The flexible cystoscope was introduced without difficulty - No urethral strictures/lesions are present. - Normal prostate short fossa with TURP defect - Normal bladder neck - Bilateral ureteral orifices identified  - Bladder mucosa  reveals multiple stellate scars including on the right posterior lateral aspect of the bladder where today, there is some concern for small patch of possible early recurrence although not definative  Post-Procedure: - Patient tolerated the procedure well   Assessment/ Plan:  1. History of bladder cancer/bladder lesion Possible early recurrence, very subtile.   Given history of low grade Ta disease, and given that area is relatively small and subtle, decision based on shared decision making to hold off on rebiopsy and follow closely  F/u cysto in 4 mo  Rosina Riis, MD

## 2023-12-03 LAB — LAB REPORT - SCANNED
A1c: 7.2
EGFR: 46

## 2023-12-03 LAB — CULTURE, URINE COMPREHENSIVE

## 2024-01-18 ENCOUNTER — Other Ambulatory Visit: Admitting: Urology

## 2024-01-23 ENCOUNTER — Ambulatory Visit (INDEPENDENT_AMBULATORY_CARE_PROVIDER_SITE_OTHER)

## 2024-01-23 VITALS — BP 142/78 | HR 56 | Ht 70.0 in | Wt 187.6 lb

## 2024-01-23 DIAGNOSIS — I10 Essential (primary) hypertension: Secondary | ICD-10-CM

## 2024-01-23 DIAGNOSIS — C673 Malignant neoplasm of anterior wall of bladder: Secondary | ICD-10-CM

## 2024-01-23 DIAGNOSIS — E119 Type 2 diabetes mellitus without complications: Secondary | ICD-10-CM

## 2024-01-23 DIAGNOSIS — Z95828 Presence of other vascular implants and grafts: Secondary | ICD-10-CM | POA: Diagnosis not present

## 2024-01-23 DIAGNOSIS — N400 Enlarged prostate without lower urinary tract symptoms: Secondary | ICD-10-CM

## 2024-01-23 DIAGNOSIS — E785 Hyperlipidemia, unspecified: Secondary | ICD-10-CM

## 2024-01-23 DIAGNOSIS — C672 Malignant neoplasm of lateral wall of bladder: Secondary | ICD-10-CM

## 2024-01-23 DIAGNOSIS — I82511 Chronic embolism and thrombosis of right femoral vein: Secondary | ICD-10-CM

## 2024-01-23 DIAGNOSIS — N200 Calculus of kidney: Secondary | ICD-10-CM

## 2024-01-23 DIAGNOSIS — K219 Gastro-esophageal reflux disease without esophagitis: Secondary | ICD-10-CM

## 2024-01-23 NOTE — Addendum Note (Signed)
 Addended by: Taygen Newsome A on: 01/23/2024 08:59 AM   Modules accepted: Orders

## 2024-01-23 NOTE — Assessment & Plan Note (Signed)
 Will have patient keep blood pressure log and bring in the next 4 weeks.  Given his dizziness, not unreasonable to query if he is running in the lower ranges at home.  Continue for now carvedilol  12.5 mg and hydrochlorothiazide  25 mg.  Discussed maintenance of adequate hydration.

## 2024-01-23 NOTE — Assessment & Plan Note (Signed)
 Patient to continue with finasteride  5 mg daily.

## 2024-01-23 NOTE — Assessment & Plan Note (Signed)
 Patient to continue with glimepiride  for now.  He reports not following a diabetic diet.  I do not have a result for hemoglobin A1c or past values so we will see what his labs show in October before making any changes to his medication.  He reports being up-to-date in terms of screening.  Will check urine microalbumin.

## 2024-01-23 NOTE — Progress Notes (Signed)
 New Patient Visit   Physician: Sascha Palma A Ashleymarie Granderson, MD  Patient: Cory Hardin   DOB: 1945/11/10   77 y.o. Male  MRN: [id number] Visit Date: 01/23/2024   Chief Complaint  Patient presents with   Establish Care    No concerns    Subjective  Cory Hardin is a 78 y.o. male who presents today as a new patient to establish care.    HPI  Patient seen to establish care.  He has a history of bladder cancer diagnosed in 2015.  He underwent TURBT in early 2016.  He had a recurrent lesion possibly in December 2024 which was treated.  He has recently established with a new urologist and will see them shortly.  Patient history of DVT x 2.  He was treated with IVC filter at 1 point which was removed.  History of hypertension for which she takes hydrochlorothiazide  25 mg tablets and carvedilol  12.5 mg tablets.  Blood pressure today in office 142/78.  Patient does not check his blood pressure at home.  He does report he has lightheadedness or dizziness at home.  Patient has a history of BPH he is currently on finasteride  5 mg tablets daily.  Wakes 2-3 times nightly to urinate.  Patient has a history of GERD for which he takes omeprazole 25 mg tablets.  Symptoms well-controlled  Patient is on rosuvastatin 20 mg.  Denies any adverse effects with this medication.  He reports he had blood work including lipids several weeks ago.  Unfortunately do not have those results as yet.  He is a type II diabetic for which he is maintained with glimepiride  1 mg tablets daily.  Denies any episodes of hypoglycemia.  Last hemoglobin A1c unknown.  Patient denies any symptoms of neuropathy although when he walks for fair distance he does report that his feet start hurting.  He has no numbness or pain at nighttime.  He reports that he did have a diabetic eye exam in the last year.  He does have lancets at home and does check his blood sugars occasionally which ranges from 127-160 at home.  He has a history of kidney  stones which have been inactive for the last 2 years.  Patient does not drink smoke and denies any drug use.  He does live alone.    Assessment & Plan   Problem List Items Addressed This Visit       Cardiovascular and Mediastinum   Chronic deep vein thrombosis (DVT) of femoral vein of right lower extremity (HCC)   Hypertension   Will have patient keep blood pressure log and bring in the next 4 weeks.  Given his dizziness, not unreasonable to query if he is running in the lower ranges at home.  Continue for now carvedilol  12.5 mg and hydrochlorothiazide  25 mg.  Discussed maintenance of adequate hydration.        Digestive   GERD (gastroesophageal reflux disease)   Patient to continue with omeprazole 20 mg daily.  Symptoms well-controlled.        Endocrine   Diabetes mellitus type 2, uncomplicated (HCC)   Patient to continue with glimepiride  for now.  He reports not following a diabetic diet.  I do not have a result for hemoglobin A1c or past values so we will see what his labs show in October before making any changes to his medication.  He reports being up-to-date in terms of screening.  Will check urine microalbumin.  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  Current plan as per their recommendation.      Calcium nephrolithiasis   Fortunately no current issues with nephrolithiasis.  Will continue to monitor.      BPH (benign prostatic hyperplasia)   Patient to continue with finasteride  5 mg daily.      Bladder cancer (HCC) - Primary     Other   Hyperlipidemia   Patient tolerating statin without issue.  Will check lipids with next lab studies.      RESOLVED: S/P IVC filter       Objective  BP (!) 142/78 (BP Location: Left Arm, Patient Position: Sitting, Cuff Size: Normal)   Pulse (!) 56   Ht 5' 10 (1.778 m)   Wt 187 lb 9.6 oz (85.1 kg)   SpO2 96%   BMI 26.92 kg/m      Review  of Systems  Constitutional:  Negative for chills, fever and weight loss.  Eyes:  Negative for blurred vision. h Respiratory:  Negative for cough and shortness of breath.   Cardiovascular:  Negative for chest pain and palpitations.  Skin:  Negative for rash.  Psychiatric/Behavioral:  Negative for depression. The patient is not nervous/anxious.      Physical Exam Physical Exam Vitals reviewed.  Constitutional:      Appearance: Normal appearance. Well-developed with normal weight.  HENT:     Head: Normocephalic and atraumatic.  Normal mucous membranes, no oral lesions Eyes:     Pupils: Pupils are equal, round, and reactive to light.  Neck:     Thyroid: No thyroid mass or thyromegaly.  Cardiovascular:     Rate and Rhythm: Normal rate and regular rhythm. Normal heart sounds. Normal peripheral pulses.  Venous sasis dermatitis noted Pulmonary:     Normal breath sounds with normal effort Abdominal:   Abdomen is soft, without tenderness or noted hepatosplenomegaly Musculoskeletal:        General: No swelling or edema  Lymphadenopathy:     Cervical: No cervical adenopathy.  Skin:    General: Skin is warm and dry without noticeable rash. Neurological:     General: No focal deficit present.  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 (HCC) 12/05/2014   Chronic kidney disease    stones,renal failure   Diabetes mellitus without complication (HCC)    DVT (deep venous thrombosis) (HCC)    GERD (gastroesophageal reflux disease)    Hematuria    History of kidney stones    Hyperlipidemia    Hypertension    Pulmonary embolism (HCC)    Pulmonary embolism and infarction (HCC) 08/31/2016   S/P IVC filter 12/31/2016   Past Surgical History:  Procedure  Laterality Date   APPENDECTOMY     bladder stones     CATARACT EXTRACTION, BILATERAL  2023   CYSTOSCOPY     CYSTOSCOPY W/ RETROGRADES Bilateral 01/30/2018   Procedure: CYSTOSCOPY WITH RETROGRADE PYELOGRAM;  Surgeon: Penne Knee, MD;  Location: [facility];  Service: Urology;  Laterality: Bilateral;   CYSTOSCOPY W/ RETROGRADES Bilateral 02/18/2020   Procedure: CYSTOSCOPY WITH RETROGRADE PYELOGRAM;  Surgeon: Penne Knee,  MD;  Location: [facility];  Service: Urology;  Laterality: Bilateral;   CYSTOSCOPY WITH BIOPSY N/A 11/20/2014   Procedure: CYSTOSCOPY WITH BIOPSY;  Surgeon: Rosina Riis, MD;  Location: [facility];  Service: Urology;  Laterality: N/A;   CYSTOSCOPY WITH BIOPSY N/A 01/30/2018   Procedure: CYSTOSCOPY WITH Bladder BIOPSY;  Surgeon: Riis Rosina, MD;  Location: [facility];  Service: Urology;  Laterality: N/A;   CYSTOSCOPY WITH STENT PLACEMENT Right 06/23/2015   Procedure: CYSTOSCOPY WITH STENT PLACEMENT;  Surgeon: Rosina Riis, MD;  Location: [facility];  Service: Urology;  Laterality: Right;   CYSTOSCOPY/URETEROSCOPY/HOLMIUM LASER/STENT PLACEMENT Right 01/30/2018   Procedure: CYSTOSCOPY/URETEROSCOPY/HOLMIUM LASER/STENT PLACEMENT;  Surgeon: Riis Rosina, MD;  Location: [facility];  Service: Urology;  Laterality: Right;   IVC FILTER INSERTION N/A 08/11/2016   Procedure: IVC Filter Insertion;  Surgeon: Selinda GORMAN Gu, MD;  Location: [facility];  Service: Cardiovascular;  Laterality: N/A;   IVC FILTER REMOVAL N/A 01/10/2017   Procedure: IVC Filter Removal;  Surgeon: Gu Selinda GORMAN, MD;  Location: [facility];  Service: Cardiovascular;  Laterality: N/A;   PERCUTANEOUS NEPHROLITHOTRIPSY     PULMONARY THROMBECTOMY N/A 08/11/2016   Procedure: Pulmonary Thrombectomy;  Surgeon: Selinda GORMAN Gu, MD;  Location: [facility];  Service: Cardiovascular;  Laterality: N/A;   REFRACTIVE SURGERY Bilateral 2024   TRANSURETHRAL RESECTION OF BLADDER TUMOR N/A 06/23/2015    Procedure: TRANSURETHRAL RESECTION OF BLADDER TUMOR (TURBT);  Surgeon: Rosina Riis, MD;  Location: [facility];  Service: Urology;  Laterality: N/A;   TRANSURETHRAL RESECTION OF BLADDER TUMOR WITH GYRUS (TURBT-GYRUS)     TRANSURETHRAL RESECTION OF BLADDER TUMOR WITH MITOMYCIN -C N/A 02/18/2020   Procedure: TRANSURETHRAL RESECTION OF BLADDER TUMOR WITH gemcitabine ;  Surgeon: Riis Rosina, MD;  Location: [facility];  Service: Urology;  Laterality: N/A;   URETEROSCOPY WITH HOLMIUM LASER LITHOTRIPSY Right 06/23/2015   Procedure: URETEROSCOPY WITH HOLMIUM LASER LITHOTRIPSY;  Surgeon: Rosina Riis, MD;  Location: [facility];  Service: Urology;  Laterality: Right;   Family Status  Relation Name Status   Mother  Deceased   Father  Deceased   Sister  Alive   Brother  Deceased   Mat Aunt  (Not Specified)   Neg Hx  (Not Specified)  No partnership data on file   Family History  Problem Relation Age of Onset   Heart disease Mother    Heart failure Mother    COPD Mother    Heart disease Father    Heart disease Brother    Diabetes Brother    Cancer Maternal Aunt    Kidney disease Neg Hx    Prostate cancer Neg Hx    Social History   Socioeconomic History   Marital status: Single    Spouse name: Not on file   Number of children: Not on file   Years of education: Not on file   Highest education level: Not asked  Occupational History   Not on file  Tobacco Use   Smoking status: Former    Current packs/day: 0.00    Average packs/day: 0.3 packs/day for 15.0 years (3.8 ttl pk-yrs)    Types: Cigarettes    Start date: 11/13/1967    Quit date: 11/13/1982    Years since quitting: 41.2    Passive exposure: Past   Smokeless tobacco: Never  Vaping Use   Vaping status: Never Used  Substance and Sexual Activity   Alcohol use: Yes    Alcohol/week: 1.0 standard drink of alcohol  Types: 1 Standard drinks or equivalent per week    Comment: rare   Drug use: Never   Sexual activity: Not Currently   Other Topics Concern   Not on file  Social History Narrative   Not on file   Social Drivers of Health   Financial Resource Strain: Not on file  Food Insecurity: Not on file  Transportation Needs: Not on file  Physical Activity: Not on file  Stress: Not on file  Social Connections: Not on file   Outpatient Medications Prior to Visit  Medication Sig   ACCU-CHEK AVIVA PLUS test strip    Accu-Chek Softclix Lancets lancets daily.   aspirin 81 MG EC tablet Take 81 mg by mouth daily.    carvedilol  (COREG ) 12.5 MG tablet Take 6.25 mg by mouth 2 (two) times daily with a meal.    fenofibrate  (TRICOR ) 145 MG tablet Take 145 mg by mouth daily.   finasteride  (PROSCAR ) 5 MG tablet Take 1 tablet by mouth once daily   glimepiride  (AMARYL ) 1 MG tablet Take 1 mg by mouth daily.    hydrochlorothiazide  (HYDRODIURIL ) 25 MG tablet Take 1 tablet by mouth once daily   omeprazole (PRILOSEC) 20 MG capsule Take 20 mg by mouth daily.    rosuvastatin (CRESTOR) 20 MG tablet Take 20 mg by mouth daily.   Facility-Administered Medications Prior to Visit  Medication Dose Route Frequency Provider   gemcitabine  (GEMZAR ) chemo syringe for bladder instillation 2,000 mg  2,000 mg Bladder Instillation Once Penne Knee, MD   gemcitabine  (GEMZAR ) chemo syringe for bladder instillation 2,000 mg  2,000 mg Bladder Instillation Once Penne Knee, MD   No Known Allergies  Immunization History  Administered Date(s) Administered   BCG 01/14/2015, 01/21/2015, 01/28/2015, 02/12/2015, 02/18/2015, 02/25/2015   Influenza, High Dose Seasonal PF 03/29/2019   Moderna Sars-Covid-2 Vaccination 08/07/2019, 09/04/2019    Health Maintenance  Topic Date Due   HEMOGLOBIN A1C  Never done   Medicare Annual Wellness (AWV)  Never done   FOOT EXAM  Never done   Diabetic kidney evaluation - Urine ACR  Never done   Hepatitis C Screening  Never done   DTaP/Tdap/Td (1 - Tdap) Never done   Zoster Vaccines- Shingrix (1 of 2) Never  done   Pneumococcal Vaccine: 50+ Years (1 of 1 - PCV) Never done   Diabetic kidney evaluation - eGFR measurement  01/24/2019   COVID-19 Vaccine (3 - Moderna risk series) 10/02/2019   OPHTHALMOLOGY EXAM  10/02/2022   INFLUENZA VACCINE  02/03/2024   Hepatitis B Vaccines  Aged Out   HPV VACCINES  Aged Out   Meningococcal B Vaccine  Aged Out    Patient Care Team: Corlis Honor BROCKS, MD as PCP - General (Internal Medicine)  Depression Screen     No data to display           Parris DELENA Juneau, MD  [facility] [phone] (phone) [phone] (fax)  [facility]

## 2024-01-23 NOTE — Assessment & Plan Note (Signed)
 Patient to continue with omeprazole 20 mg daily.  Symptoms well-controlled.

## 2024-01-23 NOTE — Assessment & Plan Note (Signed)
 Patient to follow-up with urology.  Current plan as per their recommendation.

## 2024-01-23 NOTE — Assessment & Plan Note (Signed)
 Patient tolerating statin without issue.  Will check lipids with next lab studies.

## 2024-01-23 NOTE — Assessment & Plan Note (Signed)
 Fortunately no current issues with nephrolithiasis.  Will continue to monitor.

## 2024-02-02 ENCOUNTER — Ambulatory Visit: Admitting: Urology

## 2024-02-02 ENCOUNTER — Other Ambulatory Visit: Admitting: Urology

## 2024-02-02 VITALS — BP 188/78 | HR 57 | Ht 69.0 in | Wt 181.0 lb

## 2024-02-02 DIAGNOSIS — Z8551 Personal history of malignant neoplasm of bladder: Secondary | ICD-10-CM

## 2024-02-02 MED ORDER — LIDOCAINE HCL URETHRAL/MUCOSAL 2 % EX GEL
1.0000 | Freq: Once | CUTANEOUS | Status: AC
  Administered 2024-02-02: 1 via URETHRAL

## 2024-02-02 MED ORDER — CEPHALEXIN 500 MG PO CAPS
500.0000 mg | ORAL_CAPSULE | Freq: Once | ORAL | Status: AC
  Administered 2024-02-02: 500 mg via ORAL

## 2024-02-02 NOTE — Progress Notes (Signed)
 Cystoscopy Procedure Note:  Indication: Hx NMIBC  Prior patient of Dr. Penne, my first time meeting him today.  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  Keflex  given for prophylaxis  After informed consent and discussion of the procedure and its risks, Cory Hardin was positioned and prepped in the standard fashion. Cystoscopy was performed with a flexible cystoscope. The urethra, bladder neck and entire bladder was visualized in a standard fashion. The prostate was moderate in size. The ureteral orifices were visualized in their normal location and orientation.  Bladder mucosa grossly normal throughout, no abnormalities.  No abnormalities on retroflexion.  Findings: No evidence of recurrence  Assessment and Plan: RTC 6 months cystoscopy, consider spacing to yearly at that time if no evidence of disease  Cory Burnet, MD 02/02/2024

## 2024-02-13 ENCOUNTER — Telehealth: Payer: Self-pay | Admitting: Urology

## 2024-02-13 DIAGNOSIS — C679 Malignant neoplasm of bladder, unspecified: Secondary | ICD-10-CM

## 2024-02-13 MED ORDER — HYDROCHLOROTHIAZIDE 25 MG PO TABS: 25.0000 mg | ORAL_TABLET | Freq: Every day | ORAL | 3 refills | Status: DC

## 2024-02-13 NOTE — Telephone Encounter (Signed)
 RX sent

## 2024-02-13 NOTE — Telephone Encounter (Signed)
 The patient is requesting a refill for hydrochlorothiazide  (HYDRODIURIL ) and would like the prescription to be sent to the [facility] pharmacy located on [address]. The patient also requests a call back for clarification.

## 2024-02-20 ENCOUNTER — Ambulatory Visit

## 2024-02-22 ENCOUNTER — Telehealth: Payer: Self-pay

## 2024-02-23 NOTE — Telephone Encounter (Signed)
 error

## 2024-02-24 ENCOUNTER — Ambulatory Visit

## 2024-02-24 ENCOUNTER — Other Ambulatory Visit: Payer: Self-pay

## 2024-02-24 ENCOUNTER — Other Ambulatory Visit

## 2024-02-24 DIAGNOSIS — E785 Hyperlipidemia, unspecified: Secondary | ICD-10-CM

## 2024-02-24 DIAGNOSIS — I10 Essential (primary) hypertension: Secondary | ICD-10-CM

## 2024-02-24 DIAGNOSIS — E119 Type 2 diabetes mellitus without complications: Secondary | ICD-10-CM

## 2024-02-27 ENCOUNTER — Other Ambulatory Visit

## 2024-02-27 ENCOUNTER — Ambulatory Visit

## 2024-03-01 LAB — CBC WITH DIFFERENTIAL/PLATELET
Absolute Lymphocytes: 1313 {cells}/uL (ref 850–3900)
Absolute Monocytes: 566 {cells}/uL (ref 200–950)
Basophils Absolute: 39 {cells}/uL (ref 0–200)
Basophils Relative: 0.6 %
Eosinophils Absolute: 130 {cells}/uL (ref 15–500)
Eosinophils Relative: 2 %
HCT: 45.5 % (ref 38.5–50.0)
Hemoglobin: 14.9 g/dL (ref 13.2–17.1)
MCH: 32.1 pg (ref 27.0–33.0)
MCHC: 32.7 g/dL (ref 32.0–36.0)
MCV: 98.1 fL (ref 80.0–100.0)
MPV: 11.6 fL (ref 7.5–12.5)
Monocytes Relative: 8.7 %
Neutro Abs: 4453 {cells}/uL (ref 1500–7800)
Neutrophils Relative %: 68.5 %
Platelets: 138 Thousand/uL — ABNORMAL LOW (ref 140–400)
RBC: 4.64 Million/uL (ref 4.20–5.80)
RDW: 12.7 % (ref 11.0–15.0)
Total Lymphocyte: 20.2 %
WBC: 6.5 Thousand/uL (ref 3.8–10.8)

## 2024-03-01 LAB — COMPREHENSIVE METABOLIC PANEL WITH GFR
AG Ratio: 1.8 (calc) (ref 1.0–2.5)
ALT: 18 U/L (ref 9–46)
AST: 21 U/L (ref 10–35)
Albumin: 4.4 g/dL (ref 3.6–5.1)
Alkaline phosphatase (APISO): 83 U/L (ref 35–144)
BUN/Creatinine Ratio: 17 (calc) (ref 6–22)
BUN: 23 mg/dL (ref 7–25)
CO2: 32 mmol/L (ref 20–32)
Calcium: 9.3 mg/dL (ref 8.6–10.3)
Chloride: 101 mmol/L (ref 98–110)
Creat: 1.38 mg/dL — ABNORMAL HIGH (ref 0.70–1.28)
Globulin: 2.5 g/dL (ref 1.9–3.7)
Glucose, Bld: 121 mg/dL — ABNORMAL HIGH (ref 65–99)
Potassium: 4.9 mmol/L (ref 3.5–5.3)
Sodium: 140 mmol/L (ref 135–146)
Total Bilirubin: 1 mg/dL (ref 0.2–1.2)
Total Protein: 6.9 g/dL (ref 6.1–8.1)
eGFR: 52 mL/min/1.73m2 — ABNORMAL LOW (ref >=60)

## 2024-03-01 LAB — URINALYSIS, ROUTINE W REFLEX MICROSCOPIC
Bacteria, UA: NONE SEEN /HPF
Bilirubin Urine: NEGATIVE
Glucose, UA: NEGATIVE
Hyaline Cast: NONE SEEN /LPF
Ketones, ur: NEGATIVE
Nitrite: NEGATIVE
Specific Gravity, Urine: 1.016 (ref 1.001–1.035)
Squamous Epithelial / HPF: NONE SEEN /HPF (ref <=5)
pH: 7.5 (ref 5.0–8.0)

## 2024-03-01 LAB — LIPID PANEL
Cholesterol: 140 mg/dL (ref <200)
HDL: 42 mg/dL (ref >=40)
LDL Cholesterol (Calc): 79 mg/dL
Non-HDL Cholesterol (Calc): 98 mg/dL (ref <130)
Total CHOL/HDL Ratio: 3.3 (calc) (ref <5.0)
Triglycerides: 109 mg/dL (ref <150)

## 2024-03-01 LAB — MICROALBUMIN / CREATININE URINE RATIO
Creatinine, Urine: 110 mg/dL (ref 20–320)
Microalb Creat Ratio: 60 mg/g{creat} — ABNORMAL HIGH (ref <30)
Microalb, Ur: 6.6 mg/dL

## 2024-03-01 LAB — MICROSCOPIC MESSAGE

## 2024-03-01 LAB — PSA: PSA: 0.83 ng/mL (ref <=4.00)

## 2024-03-01 LAB — HEMOGLOBIN A1C
Hgb A1c MFr Bld: 6.9 % — ABNORMAL HIGH (ref <5.7)
Mean Plasma Glucose: 151 mg/dL
eAG (mmol/L): 8.4 mmol/L

## 2024-03-01 LAB — T4, FREE: Free T4: 1 ng/dL (ref 0.8–1.8)

## 2024-03-01 LAB — LIPOPROTEIN A (LPA): Lipoprotein (a): 20 nmol/L (ref <75)

## 2024-03-01 LAB — TSH+FREE T4: TSH W/REFLEX TO FT4: 4.81 m[IU]/L — ABNORMAL HIGH (ref 0.40–4.50)

## 2024-03-12 ENCOUNTER — Ambulatory Visit (INDEPENDENT_AMBULATORY_CARE_PROVIDER_SITE_OTHER)

## 2024-03-12 VITALS — BP 150/88 | HR 50 | Ht 69.0 in | Wt 179.0 lb

## 2024-03-12 DIAGNOSIS — R413 Other amnesia: Secondary | ICD-10-CM | POA: Insufficient documentation

## 2024-03-12 DIAGNOSIS — I10 Essential (primary) hypertension: Secondary | ICD-10-CM

## 2024-03-12 DIAGNOSIS — E119 Type 2 diabetes mellitus without complications: Secondary | ICD-10-CM | POA: Diagnosis not present

## 2024-03-12 DIAGNOSIS — E782 Mixed hyperlipidemia: Secondary | ICD-10-CM | POA: Diagnosis not present

## 2024-03-12 MED ORDER — LOSARTAN POTASSIUM 25 MG PO TABS: 25.0000 mg | ORAL_TABLET | Freq: Every day | ORAL | 0 refills | Status: DC

## 2024-03-12 NOTE — Progress Notes (Signed)
 Progress Note  Physician: Rowen Wilmer A Soma Bachand, MD   HPI: Cory Hardin is a 78 y.o. male presenting on 03/12/2024 for Medical Management of Chronic Issues (Discuss labs ) .  Discussed the use of AI scribe software for clinical note transcription with the patient, who gave verbal consent to proceed.  History of Present Illness         Patient seen in follow-up.  History of type 2 diabetes which historically has been well-controlled he is hemoglobin A1c is currently at 6.9  Patient otherwise has a history of hypertension which has been treated with hydrochlorothiazide  25 mg daily and carvedilol  12.5 mg twice daily.  He did bring blood pressure log due to dizziness and lightheadedness.  Blood pressure log indicating multiple episodes where blood pressures in the lower teens with normal diastolic pressure.  Notably his heart rate is somewhat low at 51 today in the office.  He does not have exertional shortness of breath.  Some issues with urinary urgency throughout the day.  He has a history of BPH status post TURP in 2015.  He does follow with urology.  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  He does have to write things down to remember.    Medical history:  Relevant past medical, surgical, family and social history reviewed and updated as indicated. Interim medical history since our last visit reviewed.  Allergies and medications reviewed and updated.   ROS: Negative unless specifically indicated above in HPI.    Current Outpatient Medications:    ACCU-CHEK AVIVA PLUS test strip, , Disp: , Rfl:    Accu-Chek Softclix Lancets lancets, daily., Disp: , Rfl:    aspirin 81 MG EC tablet, Take 81 mg by mouth daily. , Disp: , Rfl:    carvedilol  (COREG ) 12.5 MG tablet, Take 6.25 mg by mouth 2 (two) times daily with a meal. , Disp: , Rfl:    fenofibrate  (TRICOR ) 145 MG tablet, Take 145 mg by mouth daily., Disp: , Rfl: 5   finasteride  (PROSCAR ) 5 MG tablet, Take  1 tablet by mouth once daily, Disp: 90 tablet, Rfl: 3   glimepiride  (AMARYL ) 1 MG tablet, Take 1 mg by mouth daily. , Disp: , Rfl:    hydrochlorothiazide  (HYDRODIURIL ) 25 MG tablet, Take 1 tablet (25 mg total) by mouth daily., Disp: 90 tablet, Rfl: 3   omeprazole (PRILOSEC) 20 MG capsule, Take 20 mg by mouth daily. , Disp: , Rfl:    rosuvastatin (CRESTOR) 20 MG tablet, Take 20 mg by mouth daily., Disp: , Rfl:   Current Facility-Administered Medications:    gemcitabine  (GEMZAR ) chemo syringe for bladder instillation 2,000 mg, 2,000 mg, Bladder Instillation, Once, Penne Knee, MD   gemcitabine  (GEMZAR ) chemo syringe for bladder instillation 2,000 mg, 2,000 mg, Bladder Instillation, Once, Penne Knee, MD       Objective:     BP (!) 150/88 (BP Location: Left Arm, Patient Position: Sitting, Cuff Size: Normal)   Pulse (!) 50   Ht 5' 9 (1.753 m)   Wt 179 lb (81.2 kg)   SpO2 97%   BMI 26.43 kg/m   Wt Readings from Last 3 Encounters:  03/12/24 179 lb (81.2 kg)  02/02/24 181 lb (82.1 kg)  01/23/24 187 lb 9.6 oz (85.1 kg)    Physical Exam  Physical Exam Vitals reviewed.  Constitutional:      Appearance: Normal appearance. Well-developed with normal weight.  Cardiovascular:  Rate and Rhythm: Normal rate and regular rhythm. Normal heart sounds. Normal peripheral pulses Pulmonary:     Normal breath sounds with normal effort Skin:    General: Skin is warm and dry without noticeable rash. Neurological:     General: No focal deficit present.  Psychiatric:        Mood and Affect: Mood, behavior and cognition normal      Assessment & Plan:  No diagnosis found.  No orders of the defined types were placed in this encounter.    Assessment and Plan        #1 type 2 diabetes.  Stable.  Notable for positive microalbumin in urine.  He should be on an ACE inhibitor or ARB.  Given his urinary urgency we will switch to losartan .  He should continue with glimepiride  1 mg tablet  daily.  Generally we will check repeat hemoglobin A1c in 3 months-December  2.  Hypertension discontinue hydrochlorothiazide  25 mg daily.  Hopefully this will help with control of his urinary urgency and possibly improvement in lightheadedness.  Continue orthostatics at next visit if we are still having issues.  Switch to losartan  25 mg.  Keep blood pressure log and follow-up in 4 weeks.  Continue carvedilol  12.5 mg twice daily.  3.  Hyperlipidemia.  Patient to continue on rosuvastatin 20 mg daily.  He is currently at goal.

## 2024-03-28 ENCOUNTER — Telehealth: Payer: Self-pay

## 2024-03-28 ENCOUNTER — Other Ambulatory Visit: Payer: Self-pay

## 2024-03-28 MED ORDER — FENOFIBRATE 145 MG PO TABS: 145.0000 mg | ORAL_TABLET | Freq: Every day | ORAL | 5 refills | Status: AC

## 2024-03-28 MED ORDER — ACCU-CHEK AVIVA PLUS VI STRP: ORAL_STRIP | 6 refills | Status: DC

## 2024-03-28 NOTE — Telephone Encounter (Signed)
 Copied from CRM #[id number]. Topic: Clinical - Medication Question >> Mar 27, 2024  5:14 PM Lauren C wrote: Reason for CRM: PT called to request fenofibrate  145mg  and accucheck plus prescription. The old dr who prescribed them is now retired and he is out of refills. He is wanting this sent to: [facility] [address] ([address]), [place] - [address] ([address]) [place] [address] Phone: [phone] Fax: [phone]  If any questions, please reach out to pt at [phone].

## 2024-04-04 ENCOUNTER — Other Ambulatory Visit

## 2024-04-09 ENCOUNTER — Ambulatory Visit

## 2024-04-16 ENCOUNTER — Ambulatory Visit (INDEPENDENT_AMBULATORY_CARE_PROVIDER_SITE_OTHER)

## 2024-04-16 VITALS — BP 174/80 | HR 53 | Ht 69.0 in | Wt 186.6 lb

## 2024-04-16 DIAGNOSIS — Z8551 Personal history of malignant neoplasm of bladder: Secondary | ICD-10-CM

## 2024-04-16 DIAGNOSIS — I10 Essential (primary) hypertension: Secondary | ICD-10-CM

## 2024-04-16 DIAGNOSIS — Z7984 Long term (current) use of oral hypoglycemic drugs: Secondary | ICD-10-CM

## 2024-04-16 DIAGNOSIS — E119 Type 2 diabetes mellitus without complications: Secondary | ICD-10-CM

## 2024-04-16 DIAGNOSIS — Z23 Encounter for immunization: Secondary | ICD-10-CM

## 2024-04-16 NOTE — Addendum Note (Signed)
 Addended by: BLINDA DOUGLAS on: 04/16/2024 09:26 AM   Modules accepted: Orders

## 2024-04-16 NOTE — Progress Notes (Signed)
 Progress Note  Physician: Logan Baltimore A Eugina Row, MD   HPI: Cory Hardin is a 78 y.o. male presenting on 04/16/2024 for Follow-up .  Discussed the use of AI scribe software for clinical note transcription with the patient, who gave verbal consent to proceed.  History of Present Illness   Cory Hardin is a 78 year old male with hypertension and type 2 diabetes who presents for follow-up.  Hypertension - Medication recently changed from hydrochlorothiazide  to losartan . Also taking carvedilol  6.25 mg BID - Home blood pressure monitoring shows occasional readings in the 120s with occaional 140's and normal diastolic pressures - generally within a good range - No dizziness, lightheadedness, or fatigue  Type 2 diabetes mellitus - No episodes of hypoglycemia - he remains on glimeperide 1 mg - Condition subjectively improved  Bladder cancer surveillance - History of bladder cancer under ongoing monitoring by urology  Post-acute covid-19 symptoms - Recovered from COVID-19 contracted 11-12 days ago - Initial symptoms included severe sore throat requiring walk-in clinic visit - Currently has residual cough, worse in the mornings and evenings  Sleep disturbance and nocturia - Sleep improved after adjusting medication timing with improved nocturia       Medical history:  Relevant past medical, surgical, family and social history reviewed and updated as indicated. Interim medical history since our last visit reviewed.  Allergies and medications reviewed and updated.   ROS: Negative unless specifically indicated above in HPI.    Current Outpatient Medications:    ACCU-CHEK AVIVA PLUS test strip, As needed, Disp: 100 each, Rfl: 6   Accu-Chek Softclix Lancets lancets, daily., Disp: , Rfl:    aspirin 81 MG EC tablet, Take 81 mg by mouth daily. , Disp: , Rfl:    carvedilol  (COREG ) 12.5 MG tablet, Take 6.25 mg by mouth 2 (two) times daily with a meal. , Disp: , Rfl:     fenofibrate  (TRICOR ) 145 MG tablet, Take 1 tablet (145 mg total) by mouth daily., Disp: 90 tablet, Rfl: 5   finasteride  (PROSCAR ) 5 MG tablet, Take 1 tablet by mouth once daily, Disp: 90 tablet, Rfl: 3   glimepiride  (AMARYL ) 1 MG tablet, Take 1 mg by mouth daily. , Disp: , Rfl:    lidocaine  (XYLOCAINE ) 2 % solution, SMARTSIG:15 Milliliter(s) By Mouth Every 4 Hours PRN, Disp: , Rfl:    losartan  (COZAAR ) 25 MG tablet, Take 1 tablet (25 mg total) by mouth daily., Disp: 90 tablet, Rfl: 0   omeprazole (PRILOSEC) 20 MG capsule, Take 20 mg by mouth daily. , Disp: , Rfl:    rosuvastatin (CRESTOR) 20 MG tablet, Take 20 mg by mouth daily., Disp: , Rfl:   Current Facility-Administered Medications:    gemcitabine  (GEMZAR ) chemo syringe for bladder instillation 2,000 mg, 2,000 mg, Bladder Instillation, Once, Penne Knee, MD   gemcitabine  (GEMZAR ) chemo syringe for bladder instillation 2,000 mg, 2,000 mg, Bladder Instillation, Once, Penne Knee, MD       Objective:     BP (!) 174/80   Pulse (!) 53   Ht 5' 9 (1.753 m)   Wt 186 lb 9.6 oz (84.6 kg)   SpO2 96%   BMI 27.56 kg/m   Wt Readings from Last 3 Encounters:  04/16/24 186 lb 9.6 oz (84.6 kg)  03/12/24 179 lb (81.2 kg)  02/02/24 181 lb (82.1 kg)    Physical Exam  Physical Exam Vitals reviewed.  Constitutional:      Appearance:  Normal appearance. Well-developed with normal weight.  Cardiovascular:     Rate and Rhythm: Normal rate and regular rhythm. Normal heart sounds. Normal peripheral pulses Pulmonary:     Normal breath sounds with normal effort Skin:    General: Skin is warm and dry without noticeable rash. Neurological:     General: No focal deficit present.  Psychiatric:        Mood and Affect: Mood, behavior and cognition normal      Assessment & Plan:   Encounter Diagnoses  Name Primary?   Hypertension, unspecified type Yes   Type 2 diabetes mellitus without complication, without long-term current use of  insulin  (HCC)    History of bladder cancer     No orders of the defined types were placed in this encounter.    Assessment and Plan    Bladder cancer under active surveillance Bladder cancer is under active surveillance with urology. - Continue active surveillance with urology. - Plan for repeat cystoscopy in January.  Hypertension - BP log reviewed and discussed Blood pressure occasionally elevated to 130s-140s, acceptable for age 45. Current regimen of losartan  and carvedilol  is appropriate and beneficial for kidney protection. - Continue current antihypertensive regimen with losartan  and carvedilol . - Monitor blood pressure at home regularly.  Type 2 diabetes mellitus Diabetes management well-controlled with no hypoglycemia. - Continue current diabetes management regimen. - Order labs in December to assess blood glucose levels.  Recent COVID-19 infection, resolved - no complications  General Health Maintenance - Administer flu shot today.        F/u Dec with lab

## 2024-04-25 ENCOUNTER — Other Ambulatory Visit: Payer: Self-pay

## 2024-04-25 NOTE — Telephone Encounter (Unsigned)
 Copied from CRM [id number]. Topic: Clinical - Medication Refill >> Apr 25, 2024  8:15 AM Avram MATSU wrote: Medication: glimepiride  (AMARYL ) 1 MG tablet [[id number]]  Has the patient contacted their pharmacy? Yes (Agent: If no, request that the patient contact the pharmacy for the refill. If patient does not wish to contact the pharmacy document the reason why and proceed with request.) (Agent: If yes, when and what did the pharmacy advise?)  This is the patient's preferred pharmacy:  [facility] [address] ([address]), [place] - [address] [place]) [place] [address] Phone: [phone] Fax: [phone]  Is this the correct pharmacy for this prescription? Yes If no, delete pharmacy and type the correct one.   Has the prescription been filled recently? No  Is the patient out of the medication? No  Has the patient been seen for an appointment in the last year OR does the patient have an upcoming appointment? Yes  Can we respond through MyChart? No  Agent: Please be advised that Rx refills may take up to 3 business days. We ask that you follow-up with your pharmacy.

## 2024-04-26 NOTE — Telephone Encounter (Signed)
 Requested medications are due for refill today.  unsure  Requested medications are on the active medications list.  yes  Last refill. 05/19/2015  Future visit scheduled.   yes  Notes to clinic.  Medication is historical.    Requested Prescriptions  Pending Prescriptions Disp Refills   glimepiride  (AMARYL ) 1 MG tablet      Sig: Take 1 tablet (1 mg total) by mouth daily.     Endocrinology:  Diabetes - Sulfonylureas Failed - 04/26/2024  5:09 PM      Failed - Cr in normal range and within 360 days    Creat  Date Value Ref Range Status  02/27/2024 1.38 (H) 0.70 - 1.28 mg/dL Final   Creatinine, POC  Date Value Ref Range Status  07/29/2023 43.0 mg/dL Final   Creatinine, Urine  Date Value Ref Range Status  02/27/2024 110 20 - 320 mg/dL Final         Passed - HBA1C is between 0 and 7.9 and within 180 days    Hgb A1c MFr Bld  Date Value Ref Range Status  02/27/2024 6.9 (H) <5.7 % Final    Comment:    For someone without known diabetes, a hemoglobin A1c value of 6.5% or greater indicates that they may have  diabetes and this should be confirmed with a follow-up  test. . For someone with known diabetes, a value <7% indicates  that their diabetes is well controlled and a value  greater than or equal to 7% indicates suboptimal  control. A1c targets should be individualized based on  duration of diabetes, age, comorbid conditions, and  other considerations. . Currently, no consensus exists regarding use of hemoglobin A1c for diagnosis of diabetes for children. .    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

## 2024-05-10 ENCOUNTER — Other Ambulatory Visit: Payer: Self-pay

## 2024-05-11 ENCOUNTER — Other Ambulatory Visit: Payer: Self-pay

## 2024-05-11 MED ORDER — GLIMEPIRIDE 1 MG PO TABS: 1.0000 mg | ORAL_TABLET | Freq: Every day | ORAL | 3 refills | Status: AC

## 2024-05-11 MED ORDER — GLIMEPIRIDE 1 MG PO TABS: 1.0000 mg | ORAL_TABLET | Freq: Every day | ORAL | 3 refills | Status: DC

## 2024-05-25 ENCOUNTER — Ambulatory Visit

## 2024-05-30 ENCOUNTER — Other Ambulatory Visit: Payer: Self-pay

## 2024-05-30 DIAGNOSIS — I10 Essential (primary) hypertension: Secondary | ICD-10-CM

## 2024-06-04 ENCOUNTER — Other Ambulatory Visit

## 2024-06-04 DIAGNOSIS — Z8551 Personal history of malignant neoplasm of bladder: Secondary | ICD-10-CM

## 2024-06-04 DIAGNOSIS — E119 Type 2 diabetes mellitus without complications: Secondary | ICD-10-CM

## 2024-06-04 DIAGNOSIS — I10 Essential (primary) hypertension: Secondary | ICD-10-CM

## 2024-06-04 LAB — CBC WITH DIFFERENTIAL/PLATELET
Absolute Lymphocytes: 1224 {cells}/uL (ref 850–3900)
Absolute Monocytes: 382 {cells}/uL (ref 200–950)
Basophils Absolute: 21 {cells}/uL (ref 0–200)
Basophils Relative: 0.4 %
Eosinophils Absolute: 148 {cells}/uL (ref 15–500)
Eosinophils Relative: 2.8 %
HCT: 43 % (ref 39.4–51.1)
Hemoglobin: 14 g/dL (ref 13.2–17.1)
MCH: 31.5 pg (ref 27.0–33.0)
MCHC: 32.6 g/dL (ref 31.6–35.4)
MCV: 96.6 fL (ref 81.4–101.7)
MPV: 12 fL (ref 7.5–12.5)
Monocytes Relative: 7.2 %
Neutro Abs: 3525 {cells}/uL (ref 1500–7800)
Neutrophils Relative %: 66.5 %
Platelets: 119 Thousand/uL — ABNORMAL LOW (ref 140–400)
RBC: 4.45 Million/uL (ref 4.20–5.80)
RDW: 12.7 % (ref 11.0–15.0)
Total Lymphocyte: 23.1 %
WBC: 5.3 Thousand/uL (ref 3.8–10.8)

## 2024-06-04 LAB — BASIC METABOLIC PANEL WITH GFR
BUN/Creatinine Ratio: 17 (calc) (ref 6–22)
BUN: 22 mg/dL (ref 7–25)
CO2: 30 mmol/L (ref 20–32)
Calcium: 9.5 mg/dL (ref 8.6–10.3)
Chloride: 104 mmol/L (ref 98–110)
Creat: 1.31 mg/dL — ABNORMAL HIGH (ref 0.70–1.28)
Glucose, Bld: 123 mg/dL — ABNORMAL HIGH (ref 65–99)
Potassium: 4.5 mmol/L (ref 3.5–5.3)
Sodium: 141 mmol/L (ref 135–146)
eGFR: 55 mL/min/1.73m2 — ABNORMAL LOW (ref >=60)

## 2024-06-04 LAB — HEMOGLOBIN A1C
Hgb A1c MFr Bld: 6.2 % — ABNORMAL HIGH (ref <5.7)
Mean Plasma Glucose: 131 mg/dL
eAG (mmol/L): 7.3 mmol/L

## 2024-06-05 ENCOUNTER — Telehealth: Payer: Self-pay

## 2024-06-05 DIAGNOSIS — I10 Essential (primary) hypertension: Secondary | ICD-10-CM

## 2024-06-05 NOTE — Telephone Encounter (Unsigned)
 Copied from CRM #[id number]. Topic: Clinical - Prescription Issue >> Jun 05, 2024  5:12 PM Delon DASEN wrote: Reason for CRM: losartan  (COZAAR ) 25 MG tablet- pharmacy still waiting for refill orders, patient has 3 pills left

## 2024-06-06 ENCOUNTER — Other Ambulatory Visit: Payer: Self-pay

## 2024-06-06 DIAGNOSIS — I10 Essential (primary) hypertension: Secondary | ICD-10-CM

## 2024-06-06 MED ORDER — LOSARTAN POTASSIUM 25 MG PO TABS: 25.0000 mg | ORAL_TABLET | Freq: Every day | ORAL | 0 refills | Status: DC

## 2024-06-06 NOTE — Telephone Encounter (Signed)
 Refill sent to pharmacy.

## 2024-06-12 ENCOUNTER — Ambulatory Visit

## 2024-06-18 ENCOUNTER — Telehealth: Payer: Self-pay | Admitting: Urology

## 2024-06-18 ENCOUNTER — Other Ambulatory Visit: Payer: Self-pay

## 2024-06-18 DIAGNOSIS — C679 Malignant neoplasm of bladder, unspecified: Secondary | ICD-10-CM

## 2024-06-18 MED ORDER — FINASTERIDE 5 MG PO TABS: 5.0000 mg | ORAL_TABLET | Freq: Every day | ORAL | 3 refills | Status: DC

## 2024-06-18 NOTE — Telephone Encounter (Signed)
 Patient is requesting a refill for Finasteride  be sent to pharmacy. I did make patient aware that he may need a follow up visit for refill. Please advise.

## 2024-06-18 NOTE — Telephone Encounter (Unsigned)
 Copied from CRM [id number]. Topic: Clinical - Medication Refill >> Jun 18, 2024  8:17 AM Burnard DEL wrote: Medication: rosuvastatin  (CRESTOR ) 20 MG tablet  carvedilol  (COREG ) 12.5 MG tablet     Has the patient contacted their pharmacy? No (Agent: If no, request that the patient contact the pharmacy for the refill. If patient does not wish to contact the pharmacy document the reason why and proceed with request.) (Agent: If yes, when and what did the pharmacy advise?)  This is the patient's preferred pharmacy:  [facility] [address] ([address]), [place] - [address] [place]) [place] [address] Phone: [phone] Fax: [phone]  Is this the correct pharmacy for this prescription? Yes If no, delete pharmacy and type the correct one.   Has the prescription been filled recently? No  Is the patient out of the medication? Yes  Has the patient been seen for an appointment in the last year OR does the patient have an upcoming appointment? Yes  Can we respond through MyChart? No  Agent: Please be advised that Rx refills may take up to 3 business days. We ask that you follow-up with your pharmacy.

## 2024-06-20 ENCOUNTER — Ambulatory Visit

## 2024-06-20 VITALS — BP 190/94 | Ht 69.0 in | Wt 186.0 lb

## 2024-06-20 DIAGNOSIS — C679 Malignant neoplasm of bladder, unspecified: Secondary | ICD-10-CM | POA: Diagnosis not present

## 2024-06-20 DIAGNOSIS — N1831 Chronic kidney disease, stage 3a: Secondary | ICD-10-CM

## 2024-06-20 DIAGNOSIS — N183 Chronic kidney disease, stage 3 unspecified: Secondary | ICD-10-CM | POA: Insufficient documentation

## 2024-06-20 MED ORDER — ROSUVASTATIN CALCIUM 20 MG PO TABS: 20.0000 mg | ORAL_TABLET | Freq: Every day | ORAL | 3 refills | Status: DC

## 2024-06-20 MED ORDER — CARVEDILOL 12.5 MG PO TABS: 6.2500 mg | ORAL_TABLET | Freq: Two times a day (BID) | ORAL | 3 refills | Status: DC

## 2024-06-20 MED ORDER — FINASTERIDE 5 MG PO TABS: 5.0000 mg | ORAL_TABLET | Freq: Every day | ORAL | 3 refills | Status: AC

## 2024-06-20 NOTE — Telephone Encounter (Signed)
 Duplicate request, refilled 06/20/24.  Requested Prescriptions  Pending Prescriptions Disp Refills   carvedilol  (COREG ) 12.5 MG tablet      Sig: Take 0.5 tablets (6.25 mg total) by mouth 2 (two) times daily with a meal.     Cardiovascular: Beta Blockers 3 Failed - 06/20/2024 12:11 PM      Failed - Cr in normal range and within 360 days    Creat  Date Value Ref Range Status  06/04/2024 1.31 (H) 0.70 - 1.28 mg/dL Final   Creatinine, POC  Date Value Ref Range Status  07/29/2023 43.0 mg/dL Final   Creatinine, Urine  Date Value Ref Range Status  02/27/2024 110 20 - 320 mg/dL Final         Failed - Last BP in normal range    BP Readings from Last 1 Encounters:  06/20/24 (!) 190/94         Passed - AST in normal range and within 360 days    AST  Date Value Ref Range Status  02/27/2024 21 10 - 35 U/L Final   SGOT(AST)  Date Value Ref Range Status  03/05/2014 33 15 - 37 Unit/L Final         Passed - ALT in normal range and within 360 days    ALT  Date Value Ref Range Status  02/27/2024 18 9 - 46 U/L Final   SGPT (ALT)  Date Value Ref Range Status  03/05/2014 28 U/L Final    Comment:    14-63 NOTE: New Reference Range 01/22/14          Passed - Last Heart Rate in normal range    Pulse Readings from Last 1 Encounters:  04/16/24 (!) 53         Passed - Valid encounter within last 6 months    Recent Outpatient Visits           Today Stage 3a chronic kidney disease (HCC)   [place] [facility] Everlene Parris LABOR, MD   2 months ago Flu vaccine need   [place] [facility], Parris LABOR, MD   3 months ago Hypertension, unspecified type   [place] [facility] [place], [place] A, MD   4 months ago Malignant neoplasm of anterior wall of urinary bladder [facility])   [place] [facility], [place] A, MD               rosuvastatin  (CRESTOR ) 20 MG tablet      Sig: Take 1 tablet  (20 mg total) by mouth daily.     Cardiovascular:  Antilipid - Statins 2 Failed - 06/20/2024 12:11 PM      Failed - Cr in normal range and within 360 days    Creat  Date Value Ref Range Status  06/04/2024 1.31 (H) 0.70 - 1.28 mg/dL Final   Creatinine, POC  Date Value Ref Range Status  07/29/2023 43.0 mg/dL Final   Creatinine, Urine  Date Value Ref Range Status  02/27/2024 110 20 - 320 mg/dL Final         Failed - Lipid Panel in normal range within the last 12 months    Cholesterol  Date Value Ref Range Status  02/27/2024 140 <200 mg/dL Final   LDL Cholesterol (Calc)  Date Value Ref Range Status  02/27/2024 79 mg/dL (calc) Final    Comment:    Reference range: <100 . Desirable range <100 mg/dL for primary prevention;   <  70 mg/dL for patients with CHD or diabetic patients  with > or = 2 CHD risk factors. SABRA LDL-C is now calculated using the Martin-Hopkins  calculation, which is a validated novel method providing  better accuracy than the Friedewald equation in the  estimation of LDL-C.  Gladis APPLETHWAITE et al. SANDREA. 7986;689(80): 2061-2068  (http://education.[employer].com/faq/FAQ164)    HDL  Date Value Ref Range Status  02/27/2024 42 > OR = 40 mg/dL Final   Triglycerides  Date Value Ref Range Status  02/27/2024 109 <150 mg/dL Final         Passed - Patient is not pregnant      Passed - Valid encounter within last 12 months    Recent Outpatient Visits           Today Stage 3a chronic kidney disease [facility])   [place] [facility] [place], Parris LABOR, MD   2 months ago Flu vaccine need   [place] [facility] [place], Parris LABOR, MD   3 months ago Hypertension, unspecified type   [place] [facility], [place] A, MD   4 months ago Malignant neoplasm of anterior wall of urinary bladder [facility])   [place] [facility] Everlene Parris LABOR, MD

## 2024-06-20 NOTE — Progress Notes (Signed)
 Progress Note  Physician: Itzayanna Kaster A Zahirah Cheslock, MD   HPI: Cory Hardin is a 78 y.o. male presenting on 06/20/2024 for Medical Management of Chronic Issues .  Discussed the use of AI scribe software for clinical note transcription with the patient, who gave verbal consent to proceed.  History of Present Illness   Cory Hardin is a 78 year old male who presents with issues related to medication refills and insurance billing.  Hypertension management - Difficulty obtaining refills for antihypertensive medication due to communication issues between pharmacy and medical office. - On antihypertensive medications for seventeen years without prior issues. - Does not regularly monitor blood pressure at home  - but when he has, blood pressure usually in the 130's to 140's.  BP elevated today as he is upset re insurance issues.   Diabetes mellitus management - Monitors blood glucose daily with readings typically between 90 and 130 mg/dL. - Blood glucose levels remain well controlled and never exceed 130 mg/dL.  - HgbA1C 6.2 on glimepiride  1mg    CKD III - Stable from previous labs.   Insurance and billing issues - Received a bill for over a thousand dollars due to an insurance mix-up.   Physical activity - Remains physically active by working on a [employer] and walking several days a week. - Does not participate in formal exercise but maintains activity through daily routines.         Medical history:  Relevant past medical, surgical, family and social history reviewed and updated as indicated. Interim medical history since our last visit reviewed.  Allergies and medications reviewed and updated.   ROS: Negative unless specifically indicated above in HPI.   Current Medications[1]       Objective:     BP (!) 190/94 (BP Location: Left Arm, Patient Position: Sitting, Cuff Size: Normal)   Ht 5' 9 (1.753 m)   Wt 186 lb (84.4 kg)   BMI 27.47 kg/m   Wt  Readings from Last 3 Encounters:  06/20/24 186 lb (84.4 kg)  04/16/24 186 lb 9.6 oz (84.6 kg)  03/12/24 179 lb (81.2 kg)    Physical Exam  Physical Exam Vitals reviewed.  Constitutional:      Appearance: Normal appearance. Well-developed with normal weight.  Cardiovascular:     Rate and Rhythm: Normal rate and regular rhythm. Normal heart sounds. Normal peripheral pulses Pulmonary:     Normal breath sounds with normal effort Skin:    General: Skin is warm and dry without noticeable rash. Neurological:     General: No focal deficit present.  Psychiatric:        Mood and Affect: Mood, behavior and cognition normal      Assessment & Plan:   Encounter Diagnoses  Name Primary?   Malignant neoplasm of urinary bladder, unspecified site (HCC)     No orders of the defined types were placed in this encounter.    Assessment and Plan    Hypertension Blood pressure elevated due to stress from medication refill issues. Home readings stable. - Refilled blood pressure medication with 90-day supply and three refills. - Advised weekly home blood pressure monitoring.  He will check when he gets home and I will have nursing follow up with him  Type 2 diabetes mellitus Diabetes well-controlled with hemoglobin A1c at 6.2. Blood sugar stable, 90-130 mg/dL. - Scheduled follow-up in July for diabetes management and lab check.  RXI6j - Will check  urine microalbumin with next lab.   BP  and ongoing DM control important, avoidance of NSAIDs.  General Health Maintenance Received flu shot last visit   F/u with labs in July or PRN or if BP remains elevated at home                [1]  Current Outpatient Medications:    ACCU-CHEK AVIVA PLUS test strip, As needed, Disp: 100 each, Rfl: 6   Accu-Chek Softclix Lancets lancets, daily., Disp: , Rfl:    aspirin 81 MG EC tablet, Take 81 mg by mouth daily. , Disp: , Rfl:    fenofibrate  (TRICOR ) 145 MG tablet, Take 1 tablet (145 mg total) by  mouth daily., Disp: 90 tablet, Rfl: 5   glimepiride  (AMARYL ) 1 MG tablet, Take 1 tablet (1 mg total) by mouth daily., Disp: 90 tablet, Rfl: 3   glimepiride  (AMARYL ) 1 MG tablet, Take 1 tablet (1 mg total) by mouth daily., Disp: 90 tablet, Rfl: 3   losartan  (COZAAR ) 25 MG tablet, Take 1 tablet (25 mg total) by mouth daily., Disp: 90 tablet, Rfl: 0   omeprazole (PRILOSEC) 20 MG capsule, Take 20 mg by mouth daily. , Disp: , Rfl:    carvedilol  (COREG ) 12.5 MG tablet, Take 0.5 tablets (6.25 mg total) by mouth 2 (two) times daily with a meal., Disp: 90 tablet, Rfl: 3   finasteride  (PROSCAR ) 5 MG tablet, Take 1 tablet (5 mg total) by mouth daily., Disp: 90 tablet, Rfl: 3   lidocaine  (XYLOCAINE ) 2 % solution, SMARTSIG:15 Milliliter(s) By Mouth Every 4 Hours PRN (Patient not taking: Reported on 06/20/2024), Disp: , Rfl:    rosuvastatin  (CRESTOR ) 20 MG tablet, Take 1 tablet (20 mg total) by mouth daily., Disp: 90 tablet, Rfl: 3  Current Facility-Administered Medications:    gemcitabine  (GEMZAR ) chemo syringe for bladder instillation 2,000 mg, 2,000 mg, Bladder Instillation, Once, Penne Knee, MD

## 2024-07-11 ENCOUNTER — Telehealth: Payer: Self-pay

## 2024-07-11 MED ORDER — ACCU-CHEK AVIVA PLUS VI STRP: ORAL_STRIP | 6 refills | Status: DC

## 2024-07-11 NOTE — Telephone Encounter (Signed)
 Copied from CRM #[id number]. Topic: Clinical - Medication Refill >> Jul 11, 2024 10:59 AM Harlene ORN wrote: Medication: ACCU-CHEK AVIVA PLUS test strip  Has the patient contacted their pharmacy? Yes (Agent: If no, request that the patient contact the pharmacy for the refill. If patient does not wish to contact the pharmacy document the reason why and proceed with request.) (Agent: If yes, when and what did the pharmacy advise?)  This is the patient's preferred pharmacy:  [facility] [address] ([address]), [place] - [address] [place]) [place] [address] Phone: [phone] Fax: [phone]  Is this the correct pharmacy for this prescription? Yes If no, delete pharmacy and type the correct one.   Has the prescription been filled recently? No  Is the patient out of the medication? Yes  Has the patient been seen for an appointment in the last year OR does the patient have an upcoming appointment? Yes  Can we respond through MyChart? No  Agent: Please be advised that Rx refills may take up to 3 business days. We ask that you follow-up with your pharmacy.

## 2024-07-11 NOTE — Telephone Encounter (Signed)
 Done

## 2024-07-11 NOTE — Addendum Note (Signed)
 Addended by: ZELIA GAUZE D on: 07/11/2024 11:52 AM   Modules accepted: Orders

## 2024-07-11 NOTE — Telephone Encounter (Signed)
 Copied from CRM #[id number]. Topic: Clinical - Prescription Issue >> Jul 11, 2024 11:21 AM Treva T wrote: Reason for CRM: Received call from [facility] with [facility] pharmacy, [phone].  Calling requesting clarity on prescription received for  ACCU-CHEK AVIVA PLUS test strips.  Per pharmacy, can no longer accept prescriptions as use as directed, or as needed.   Prescription needs to indicate specific directions of how pt is to use testing strips. (Once a day, twice a day, and how many times a day, etc.)  [facility] is requesting a return call for specifics on prescription, or resubmit prescription with specific directions, can be reached at [phone].  [facility] [address] ([address]), [place] - [address] [place]) [place] [address] Phone: [phone] Fax: [phone]  Caller is aware of same day call back.

## 2024-07-25 ENCOUNTER — Ambulatory Visit: Payer: Self-pay

## 2024-07-25 ENCOUNTER — Other Ambulatory Visit: Payer: Self-pay

## 2024-07-25 MED ORDER — CARVEDILOL 12.5 MG PO TABS: 6.2500 mg | ORAL_TABLET | Freq: Two times a day (BID) | ORAL | 3 refills | Status: DC

## 2024-07-25 NOTE — Telephone Encounter (Signed)
 Copied from CRM #[id number]. Topic: Clinical - Medication Refill >> Jul 25, 2024  9:46 AM Tiffany B wrote: Patient would like a follow up call to discuss the frequency and dosage today.   Medication: carvedilol  (COREG ) 12.5 MG tablet   Patient requesting a 90 day supply and the directions should reflect 1 and 1/2 in the morning and 1 and 1/2 in the evening for carvedilol  (COREG ) 12.5 MG tablet.  Patient states pharmacy  only has 30 day supply and insurance will not cover. Insurance will cover a 90 day.   Has the patient contacted their pharmacy? Yes  This is the patient's preferred pharmacy:  [facility] [address]), [place] - [address] [place]) [place] [address] Phone: [phone] Fax: [phone]  Is this the correct pharmacy for this prescription? Yes   Has the prescription been filled recently? Yes  Is the patient out of the medication? No   Has the patient been seen for an appointment in the last year OR does the patient have an upcoming appointment? Yes  Can we respond through MyChart? No  Agent: Please be advised that Rx refills may take up to 3 business days. We ask that you follow-up with your pharmacy.

## 2024-07-25 NOTE — Telephone Encounter (Signed)
 Pt requesting call (not MyChart) re: carvedilol  dosing

## 2024-08-01 ENCOUNTER — Other Ambulatory Visit: Payer: Self-pay

## 2024-08-01 ENCOUNTER — Telehealth: Payer: Self-pay

## 2024-08-01 DIAGNOSIS — I10 Essential (primary) hypertension: Secondary | ICD-10-CM

## 2024-08-01 MED ORDER — GLIMEPIRIDE 1 MG PO TABS: 1.0000 mg | ORAL_TABLET | Freq: Every day | ORAL | 0 refills | Status: AC

## 2024-08-01 MED ORDER — OMEPRAZOLE 20 MG PO CPDR: 20.0000 mg | DELAYED_RELEASE_CAPSULE | Freq: Every day | ORAL | 0 refills | Status: AC

## 2024-08-01 MED ORDER — CARVEDILOL 12.5 MG PO TABS: 18.2500 mg | ORAL_TABLET | Freq: Two times a day (BID) | ORAL | 1 refills | Status: AC

## 2024-08-01 NOTE — Telephone Encounter (Signed)
 Tried calling patient no answer or VM, voicemail not set up yet

## 2024-08-01 NOTE — Addendum Note (Signed)
 Addended by: EDMAN MARSA PARAS on: 08/01/2024 12:44 PM   Modules accepted: Orders

## 2024-08-01 NOTE — Telephone Encounter (Signed)
 Patient was in office this morning with concerns about his medications. He is questioning how he should be taking the Carvedilol . He states he is completely out and Dr. Tari directions and previous providers directions do not match. He has been taking 1 and 1/2 tabs am and pm. Could you please verify correct dose? Thanks!   Side note- would like to switch medications to [facility] to see if they would cover 90 day supplies. Having a lot of issues with current pharmacy.

## 2024-08-01 NOTE — Telephone Encounter (Signed)
 I have reviewed the chart and I see the previous doses listed and that the current rx that was sent did not match and was very limited quantity which is why he ran out.  I confirmed with scanned records from Dr Corlis that he was previously on 12.5mg  tab taking ONE and HALF tab = 18.25mg  TWICE A DAY dosing, so he will need 3 pills per day to do this dose, = 270 pills for 90 day.  New rx sent to [facility] for 90 day and +1 refill  His phone is inactive, and not setup. So I could not call him.  Marsa Officer, DO [employer] [facility] 08/01/2024, 12:44 PM

## 2024-08-03 ENCOUNTER — Other Ambulatory Visit: Payer: Self-pay

## 2024-08-03 DIAGNOSIS — I10 Essential (primary) hypertension: Secondary | ICD-10-CM

## 2024-08-03 MED ORDER — ACCU-CHEK AVIVA PLUS VI STRP: ORAL_STRIP | 6 refills | Status: AC

## 2024-08-03 MED ORDER — ACCU-CHEK SOFTCLIX LANCETS MISC: 1.0000 | Freq: Every day | 1 refills | Status: AC

## 2024-08-03 MED ORDER — ROSUVASTATIN CALCIUM 20 MG PO TABS: 20.0000 mg | ORAL_TABLET | Freq: Every day | ORAL | 3 refills | Status: AC

## 2024-08-03 MED ORDER — LOSARTAN POTASSIUM 25 MG PO TABS: 25.0000 mg | ORAL_TABLET | Freq: Every day | ORAL | 0 refills | Status: AC

## 2024-08-08 ENCOUNTER — Ambulatory Visit: Admitting: Urology

## 2024-08-08 VITALS — BP 225/95 | HR 54 | Ht 72.0 in | Wt 177.0 lb

## 2024-08-08 DIAGNOSIS — C679 Malignant neoplasm of bladder, unspecified: Secondary | ICD-10-CM | POA: Diagnosis not present

## 2024-08-08 MED ORDER — CEPHALEXIN 250 MG PO CAPS
500.0000 mg | ORAL_CAPSULE | Freq: Once | ORAL | Status: AC
  Administered 2024-08-08: 500 mg via ORAL

## 2024-08-08 MED ORDER — LIDOCAINE HCL URETHRAL/MUCOSAL 2 % EX GEL
1.0000 | Freq: Once | CUTANEOUS | Status: AC
  Administered 2024-08-08: 1 via URETHRAL

## 2024-08-08 NOTE — Progress Notes (Signed)
 Cory Hardin presents for an office/procedure visit. BP today is 222/95. He/She is complaint/noncompliant with BP medication. Greater than 140/90. Provider  notified. Pt advised to______________. Pt voiced understanding.

## 2024-08-08 NOTE — Patient Instructions (Signed)
 High Blood Pressure (Hypertension): How to Manage High blood pressure (BP) is also called hypertension. It's when the force of the blood pressing against the walls of your arteries is too strong. Arteries are blood vessels that carry blood from your heart to your body. High BP forces your heart to work harder to pump blood. It may also cause your arteries to get narrow or stiff. How does high BP affect me? It's very important to manage your high BP. Over time, high BP can: Damage your arteries. Reduce blood flow to parts of your body, such as: Your brain. Your heart. Your kidneys. Untreated high BP can lead to: A heart attack or stroke. An aneurysm. This is a bulge in the wall of a blood vessel. It's most common in an artery. Heart failure. Kidney damage. Eye damage. Trouble focusing and memory problems. What actions can I take to manage my high BP?  You can manage your high BP by making changes to your daily life and taking medicines. Your health care provider will help you make a plan to help lower your BP. You should also closely watch your BP. You will have it checked often by your provider and should also check it at home as told. How to manage changes to your daily life Managing stress Figure out what's causing stress in your life. To manage stress, try: Meditation. Deep breathing. Making time for things you like doing. Getting more exercise. Getting enough sleep. Medicines You may need to take medicines to get your BP under control. Talk to your provider about all the medicines you take. You need to know what the medicines do and what side effects they may have. Take your medicines only as told. Check with your provider before you: Stop taking a medicine. Change the amount you take. Change how often you take a medicine. General instructions Do not smoke, vape, or use nicotine or tobacco. Follow these instructions at home: Eating and drinking Eat foods that are high  in: Fiber. Potassium. Avoid eating: Salt (sodium). Try to have 1,500 mg or less of salt a day. Added sugar. Fat. If told, go on the DASH diet. DASH stands for Dietary Approaches to Stop Hypertension. To eat this way: Try to fill half your plate at each meal with fresh fruits and vegetables. Fill about a fourth of your plate with whole grains, such as whole-wheat pasta, brown rice, and whole-grain bread. Eat low-fat dairy products. Fill about a fourth of your plate with low-fat proteins, such as fish, chicken without skin, beans, eggs, and tofu. Avoid fatty cuts of meat and poultry with skin. Avoid pre-made and processed foods. Look for a Heart-Check mark on products you buy. The American Heart Association [facility]) puts this symbol on things that meet their guidelines for a single serving for healthy people over age 71. Talk with an expert in healthy eating called a dietitian. They can help you figure out what foods and drinks you can have. Alcohol use Drinking too much alcohol can raise your BP. Your provider may ask you to drink less. If you drink alcohol: Limit how much you have to: 0-1 drink a day if you're male. 0-2 drinks a day if you're male. Know how much alcohol is in your drink. In the [place] one drink is one 12 oz bottle of beer (355 mL), one 5 oz glass of wine (148 mL), or one 1 oz glass of hard liquor (44 mL). Activity  Exercise can help reduce high BP. It  can also help: Manage your weight. Strengthen your heart. Lower your stress level. Do aerobic exercises. These exercises make your heart beat faster. Try to get at least 150 minutes of aerobic exercise each week, such as: Brisk walking, hiking, or biking. Rowing or swimming. Fitness classes or team sports. Do exercises to strengthen your muscles, such as weight lifting. Do these exercises for 30 minutes at least 3 days a week. Where to find more information To learn more, go to: The National Heart Lung and Blood  Institute at buffalodrycleaner.gl. Click the magnifying glass and type high blood pressure. Find the link you need. American Heart Association at thisjobs.cz. Click Search and type high blood pressure. Find the link you need. Contact a health care provider if: You have a reaction to your medicines. You have headaches. You feel dizzy. You have swelling in your ankles. You feel like throwing up. Get help right away if: You have chest pain or trouble breathing. You have very bad pain in your back or belly. You have any signs of a stroke. BE FAST is an easy way to remember the main warning signs: B - Balance. Feeling dizzy, sudden trouble walking, or loss of balance. E - Eyes. Trouble seeing or a change in how you see. F - Face. Sudden weakness or feeling numb in the face. The face or eyelid may droop on one side. A - Arms. Weakness or loss of feeling in an arm. This happens fast and often only on one side. S - Speech. Sudden trouble speaking, slurred speech, or trouble understanding what people say. T - Time. Time to call 911. Write down what time symptoms started. Other signs of a stroke can be: A sudden, very bad headache with no known cause. Feeling like you may throw up. Throwing up. These symptoms may be an emergency. Call 911 right away. Do not wait to see if the symptoms will go away. Do not drive yourself to the hospital. This information is not intended to replace advice given to you by your health care provider. Make sure you discuss any questions you have with your health care provider. Document Revised: 11/07/2023 Document Reviewed: 11/07/2023 Elsevier Patient Education  2025 [employer].

## 2024-08-08 NOTE — Progress Notes (Signed)
 Cystoscopy Procedure Note:  Indication: Hx NMIBC  Prior patient of Dr. Penne, my initial visit with him July 2025.  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  Keflex  given for prophylaxis  After informed consent and discussion of the procedure and its risks, Cory Hardin was positioned and prepped in the standard fashion. Cystoscopy was performed with a flexible cystoscope. The urethra, bladder neck and entire bladder was visualized in a standard fashion. The prostate was moderate in size. The ureteral orifices were visualized in their normal location and orientation.  Bladder mucosa grossly normal throughout, no abnormalities.  No abnormalities on retroflexion.  Findings: No evidence of recurrence  Assessment and Plan: He would like to space to yearly cystoscopy at this time, risk of missing additional pathology discussed RTC 1 year cystoscopy, possible in office fulguration if needed  Cory Burnet, MD 08/08/2024

## 2025-01-07 ENCOUNTER — Other Ambulatory Visit

## 2025-01-14 ENCOUNTER — Ambulatory Visit

## 2025-08-08 ENCOUNTER — Other Ambulatory Visit: Admitting: Urology
